# Patient Record
Sex: Female | Born: 1955 | Race: White | Hispanic: No | Marital: Single | Smoking: Former smoker
Health system: Southern US, Community
[De-identification: ages and names within clinical notes are randomized; demographics above are authoritative.]

## PROBLEM LIST (undated history)

## (undated) DIAGNOSIS — E669 Obesity, unspecified: Secondary | ICD-10-CM

## (undated) DIAGNOSIS — Z8709 Personal history of other diseases of the respiratory system: Secondary | ICD-10-CM

## (undated) DIAGNOSIS — I252 Old myocardial infarction: Secondary | ICD-10-CM

## (undated) DIAGNOSIS — M1990 Unspecified osteoarthritis, unspecified site: Secondary | ICD-10-CM

## (undated) DIAGNOSIS — C679 Malignant neoplasm of bladder, unspecified: Secondary | ICD-10-CM

## (undated) DIAGNOSIS — I5030 Unspecified diastolic (congestive) heart failure: Secondary | ICD-10-CM

## (undated) DIAGNOSIS — Z955 Presence of coronary angioplasty implant and graft: Secondary | ICD-10-CM

## (undated) DIAGNOSIS — C44601 Unspecified malignant neoplasm of skin of unspecified upper limb, including shoulder: Secondary | ICD-10-CM

## (undated) DIAGNOSIS — Z87891 Personal history of nicotine dependence: Secondary | ICD-10-CM

## (undated) DIAGNOSIS — E785 Hyperlipidemia, unspecified: Secondary | ICD-10-CM

## (undated) DIAGNOSIS — L309 Dermatitis, unspecified: Secondary | ICD-10-CM

## (undated) DIAGNOSIS — I2510 Atherosclerotic heart disease of native coronary artery without angina pectoris: Secondary | ICD-10-CM

## (undated) DIAGNOSIS — H269 Unspecified cataract: Secondary | ICD-10-CM

## (undated) DIAGNOSIS — R7303 Prediabetes: Secondary | ICD-10-CM

## (undated) DIAGNOSIS — Z973 Presence of spectacles and contact lenses: Secondary | ICD-10-CM

## (undated) DIAGNOSIS — R739 Hyperglycemia, unspecified: Secondary | ICD-10-CM

## (undated) DIAGNOSIS — I10 Essential (primary) hypertension: Secondary | ICD-10-CM

## (undated) DIAGNOSIS — D229 Melanocytic nevi, unspecified: Secondary | ICD-10-CM

## (undated) DIAGNOSIS — J869 Pyothorax without fistula: Secondary | ICD-10-CM

## (undated) DIAGNOSIS — J189 Pneumonia, unspecified organism: Secondary | ICD-10-CM

## (undated) DIAGNOSIS — N189 Chronic kidney disease, unspecified: Secondary | ICD-10-CM

## (undated) HISTORY — PX: OTHER SURGICAL HISTORY: SHX169

## (undated) HISTORY — DX: Unspecified diastolic (congestive) heart failure: I50.30

## (undated) HISTORY — DX: Essential (primary) hypertension: I10

## (undated) HISTORY — PX: TUBAL LIGATION: SHX77

## (undated) HISTORY — DX: Old myocardial infarction: I25.2

## (undated) HISTORY — DX: Melanocytic nevi, unspecified: D22.9

## (undated) HISTORY — DX: Pyothorax without fistula: J86.9

## (undated) HISTORY — PX: VIDEO ASSISTED THORACOSCOPY (VATS)/EMPYEMA: SHX6172

## (undated) HISTORY — PX: DILATION AND CURETTAGE OF UTERUS: SHX78

## (undated) HISTORY — PX: CARDIAC CATHETERIZATION: SHX172

---

## 2001-09-17 ENCOUNTER — Other Ambulatory Visit: Admission: RE | Admit: 2001-09-17 | Discharge: 2001-09-17 | Payer: Self-pay | Admitting: *Deleted

## 2002-10-08 ENCOUNTER — Other Ambulatory Visit: Admission: RE | Admit: 2002-10-08 | Discharge: 2002-10-08 | Payer: Self-pay | Admitting: *Deleted

## 2009-08-28 ENCOUNTER — Inpatient Hospital Stay (HOSPITAL_COMMUNITY): Admission: EM | Admit: 2009-08-28 | Discharge: 2009-09-05 | Payer: Self-pay | Admitting: Emergency Medicine

## 2009-08-28 ENCOUNTER — Ambulatory Visit: Payer: Self-pay | Admitting: Internal Medicine

## 2009-08-28 ENCOUNTER — Emergency Department (HOSPITAL_COMMUNITY): Admission: EM | Admit: 2009-08-28 | Discharge: 2009-08-28 | Payer: Self-pay | Admitting: Family Medicine

## 2009-08-28 IMAGING — CR DG CHEST 2V
2 series · 2 of 2 positions shown · non-contrast
Comparison: None.

CLINICAL DATA: Decreased oxygen saturation, shortness of breath for
2 days, smoking history

CHEST - 2 VIEW

[view not recorded (1 of 2)]
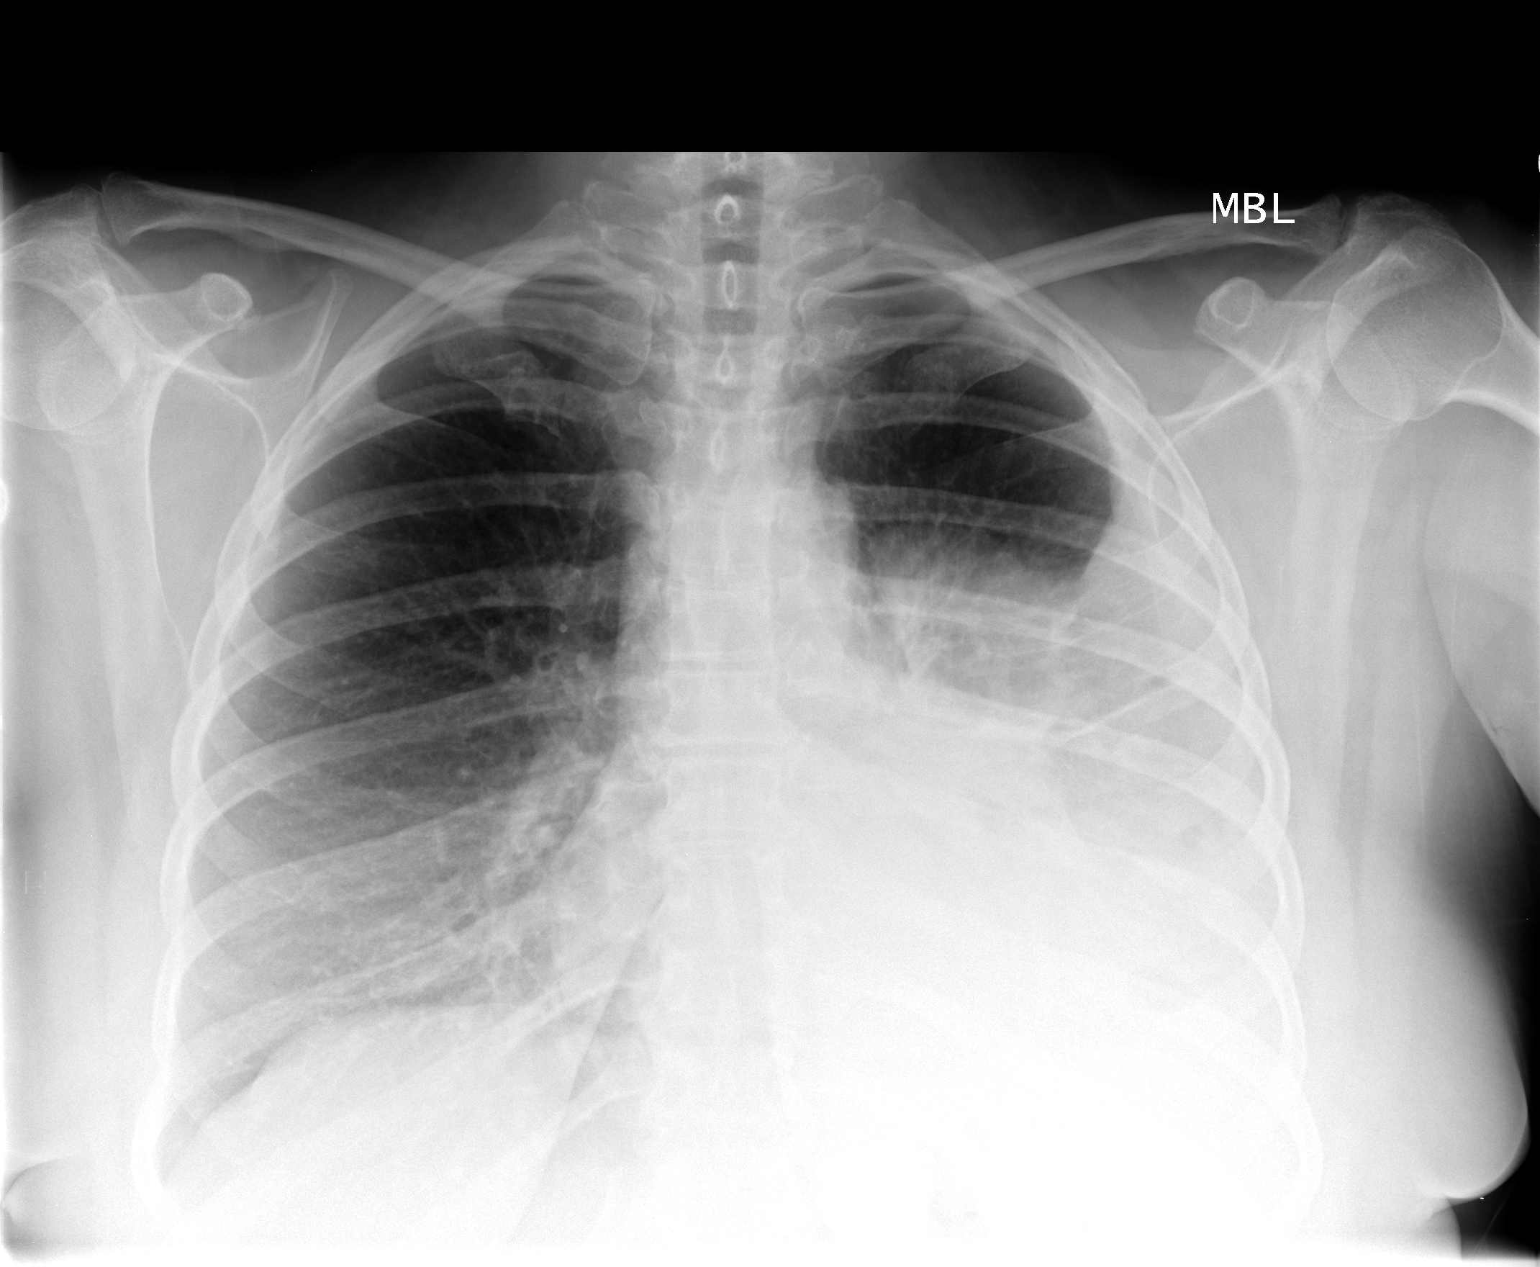

[view not recorded (2 of 2)]
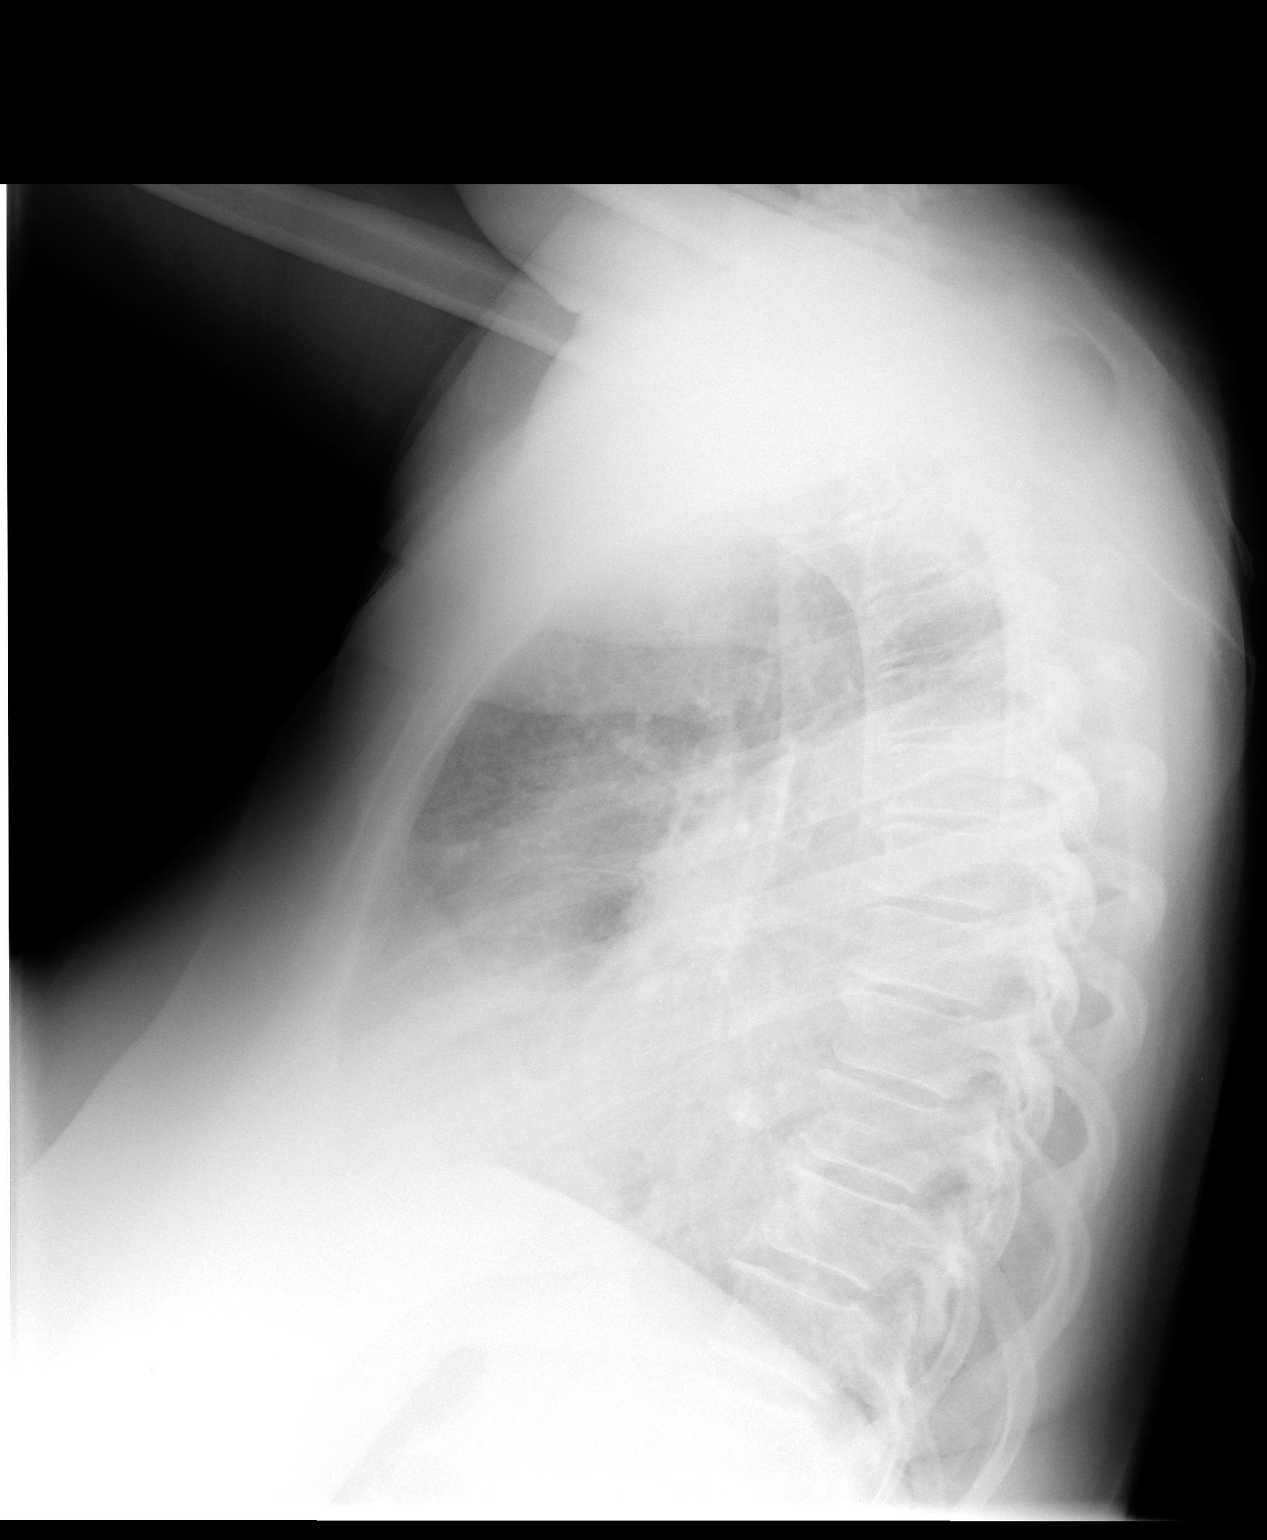

[2 of 2 positions shown; findings below may reference images not displayed]

FINDINGS: There is opacity at the left lung base consistent with
atelectasis, effusion, and possibly pneumonia.  Some bulging of the
fissure is noted on the lateral view and pneumonia is a definite
consideration versus possible loculated effusion.  A central
endobronchial lesion cannot be excluded and follow-up is
recommended.  The right lung is clear.  Heart size is stable.  No
bony abnormality is seen.
IMPRESSION: Abnormal opacity in the left lower lobe consistent with pneumonia
and left effusion, possibly loculated.  A central endobronchial
lesion cannot be excluded and follow-up is recommended.

## 2009-08-28 IMAGING — CT CT ANGIO CHEST
2 of 6 series · 19 of 36 positions shown · IV contrast (APPLIED)
Comparison: Chest radiograph same date

CLINICAL DATA: Shortness of breath, chest pain, arm hand and face
swelling

CT ANGIOGRAPHY CHEST WITH CONTRAST
TECHNIQUE: Multidetector CT imaging of the chest was performed
using the standard protocol during bolus administration of
intravenous contrast.  Multiplanar CT image reconstructions
including MIPs were obtained to evaluate the vascular anatomy.
Contrast:  100 ml Omniscan 300 IV contrast

[Series 8: pulm embolism 1.0 b25f thins · axial · 0.70mm/px · z∈[-263,-29]mm · 18 of 262 slices shown]
[im 14/262  lung]
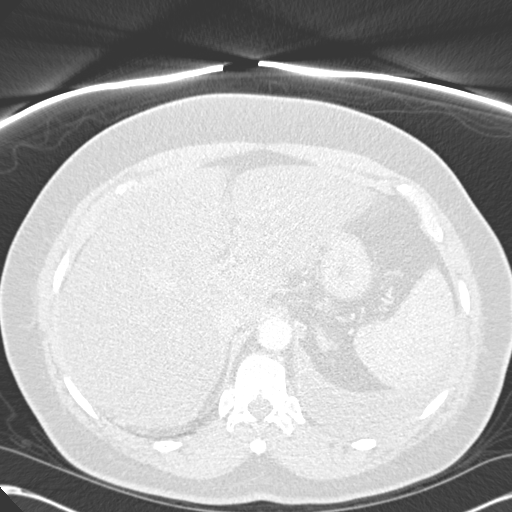
[im 27/262  mediastinal]
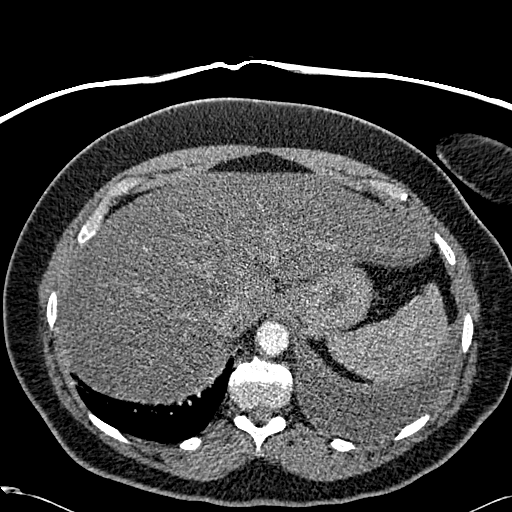
[im 40/262  lung]
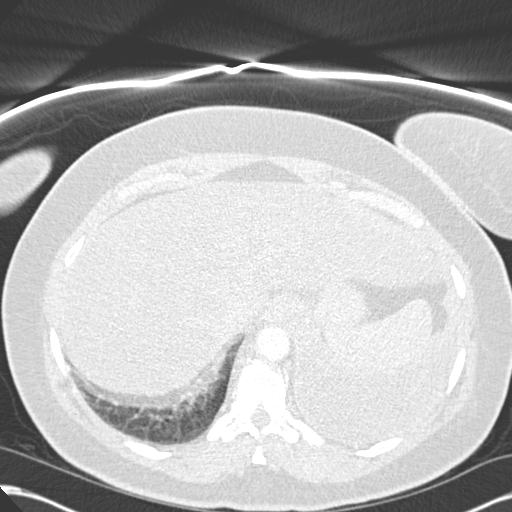
[im 53/262  mediastinal]
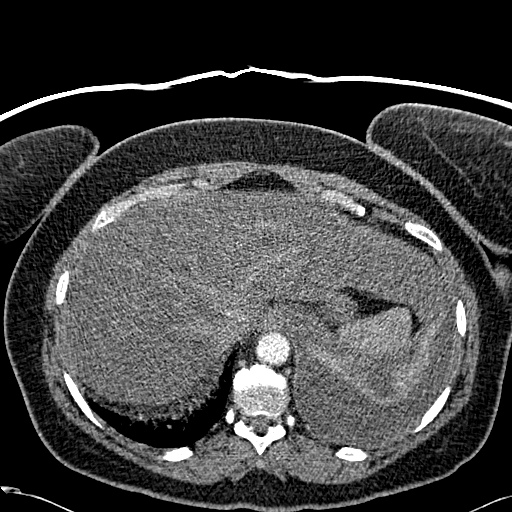
[im 66/262  lung]
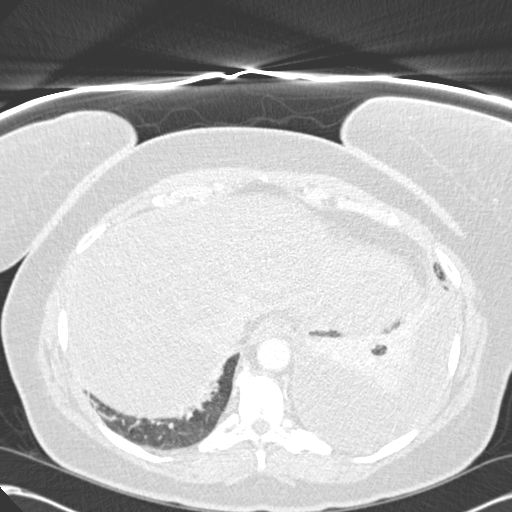
[im 79/262  mediastinal]
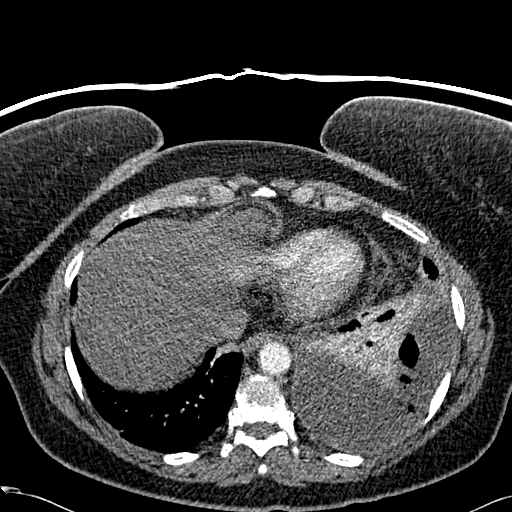
[im 92/262  lung]
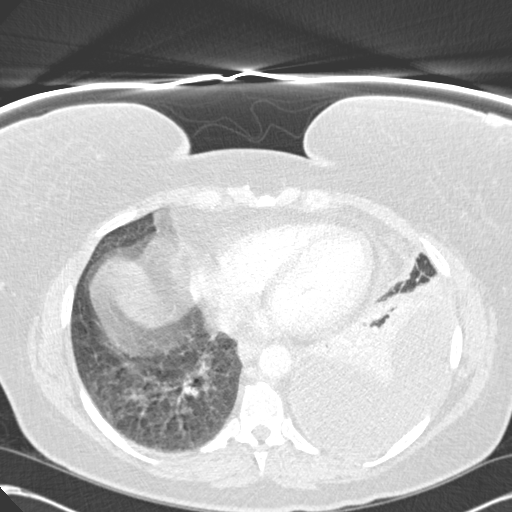
[im 105/262  mediastinal]
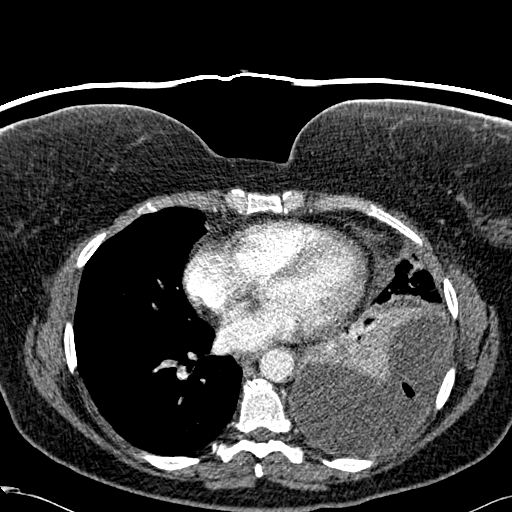
[im 118/262  lung]
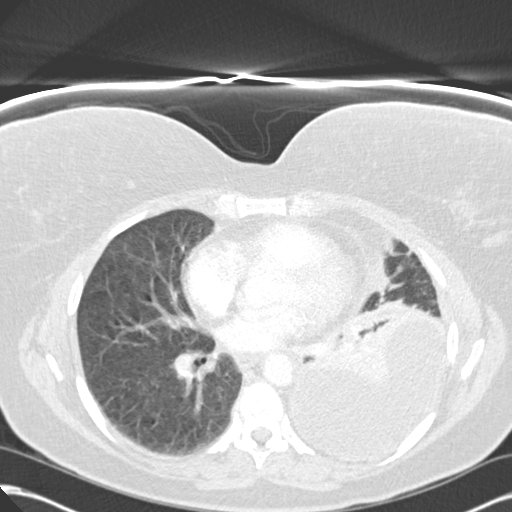
[im 144/262  mediastinal]
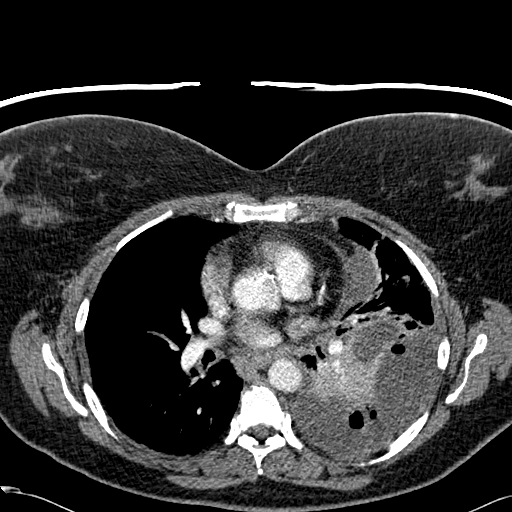
[im 157/262  lung]
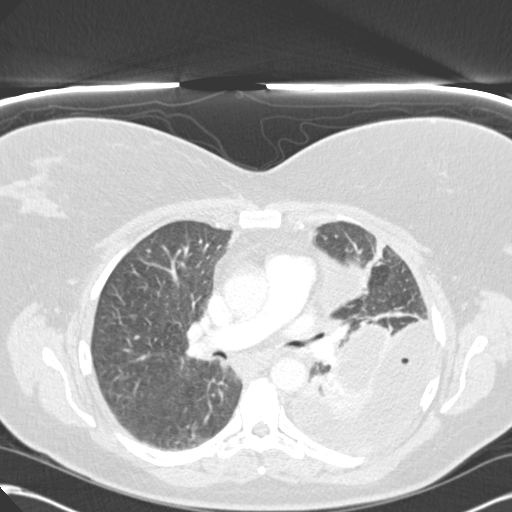
[im 170/262  mediastinal]
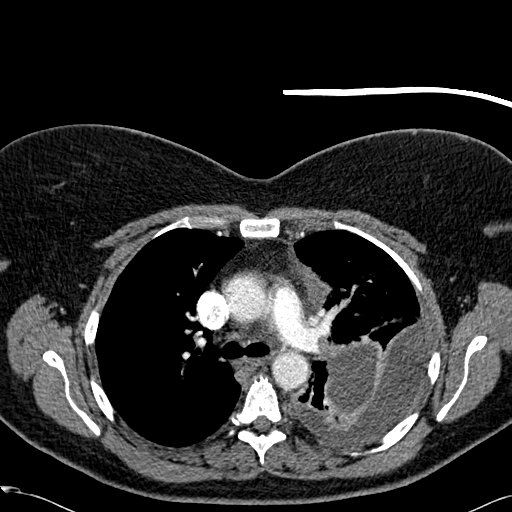
[im 183/262  lung]
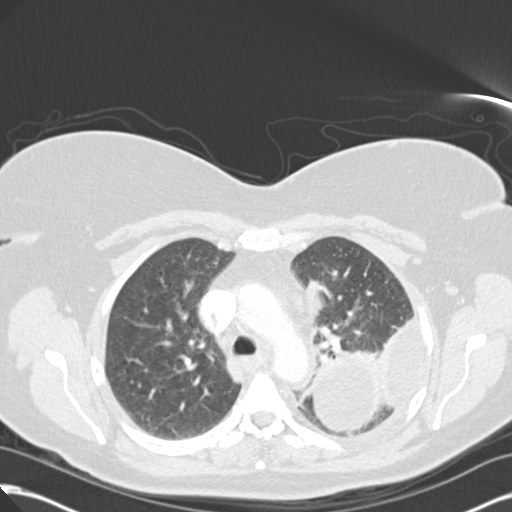
[im 196/262  mediastinal]
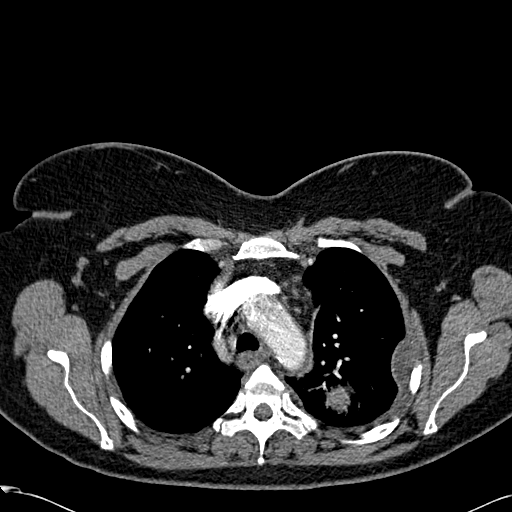
[im 209/262  lung]
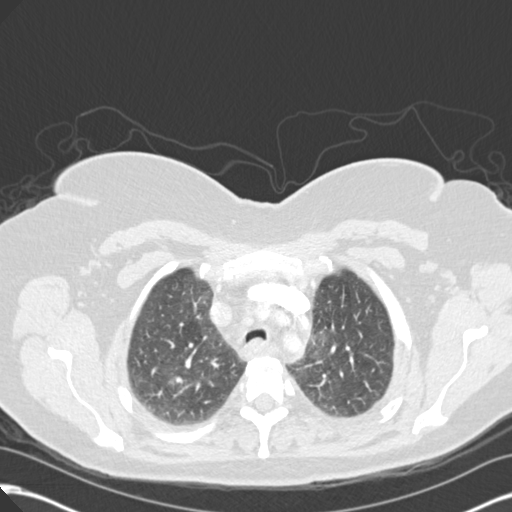
[im 222/262  mediastinal]
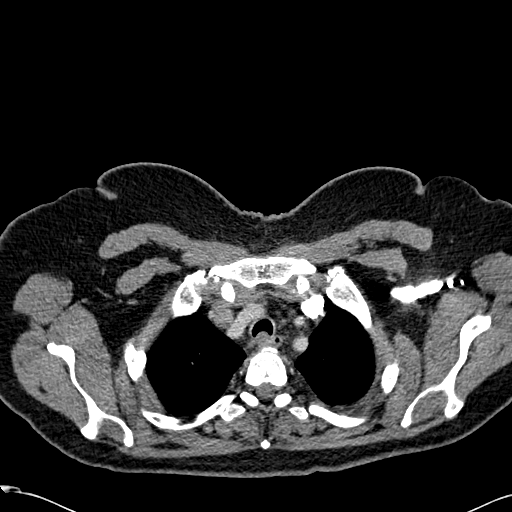
[im 235/262  lung]
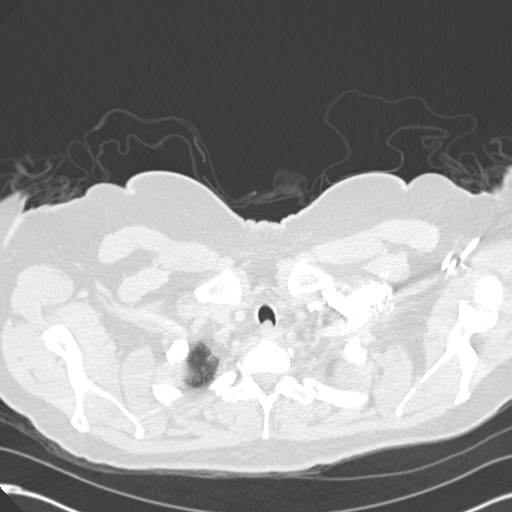
[im 248/262  mediastinal]
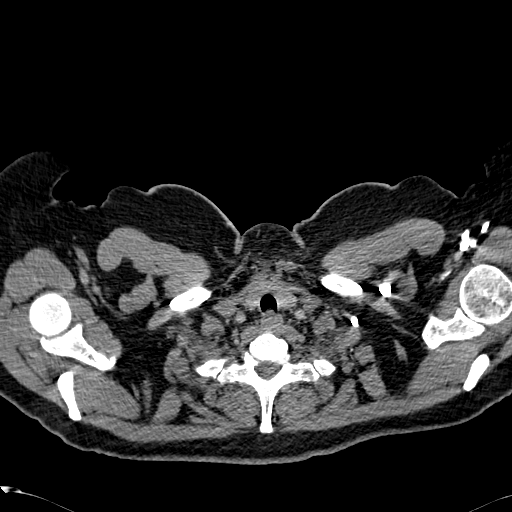

[Series 604: cor mpr · coronal · 0.70mm/px · 1 of 124 slices shown]
[im 62/124  mediastinal]
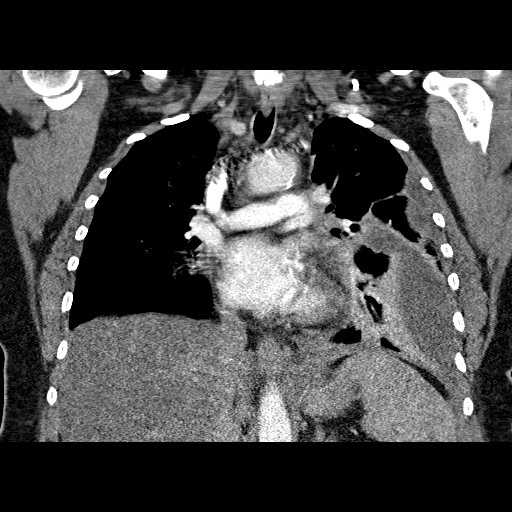

[19 of 36 positions shown; findings below may reference images not displayed]

FINDINGS: Large loculated left pleural effusion with associated
compressive atelectasis again noted.  The atelectatic lung appears
perfused.  A few foci of gas are noted within the pleural effusion,
which may suggest bronchopleural fistula.

The study is adequate for evaluation for pulmonary embolism up to
the 3rd order pulmonary arteries but is suboptimal for more distal
evaluation to air of the bolus timing.  No central focal filling
defect is seen to suggest acute pulmonary embolism.  Heart size is
normal.  No lymphadenopathy.  Trace fluid abuts the left
pericardial surface.

Examination is degraded by patient motion. Linear areas of left
upper lobe atelectasis are noted.  The right lung is grossly clear.
Central airways are patent.  Mild apparent narrowing of the left
mainstem bronchus at its mid aspect is noted, image 35.  There is
also mild motion artifact at this level, which makes this finding
difficult to interpret with certainty.

No acute osseous abnormality.

Review of the MIP images confirms the above findings.
IMPRESSION: Large loculated left pleural effusion containing foci of gas which
may suggest bronchopleural fistula.  Associated compressive
atelectasis is noted.

Possible narrowing versus artifactual appearance of the mid aspect
of the left mainstem bronchus.

No central pulmonary embolism identified.

## 2009-08-29 ENCOUNTER — Ambulatory Visit: Payer: Self-pay | Admitting: Thoracic Surgery (Cardiothoracic Vascular Surgery)

## 2009-08-29 IMAGING — CR DG CHEST 1V PORT
1 series · 1 of 1 positions shown · non-contrast
Comparison: CTA of the chest performed earlier today at [DATE] p.m.

CLINICAL DATA: Left-sided pneumonia and loculated effusion, status
post thoracentesis; shortness of breath.

PORTABLE CHEST - 1 VIEW

[AP]
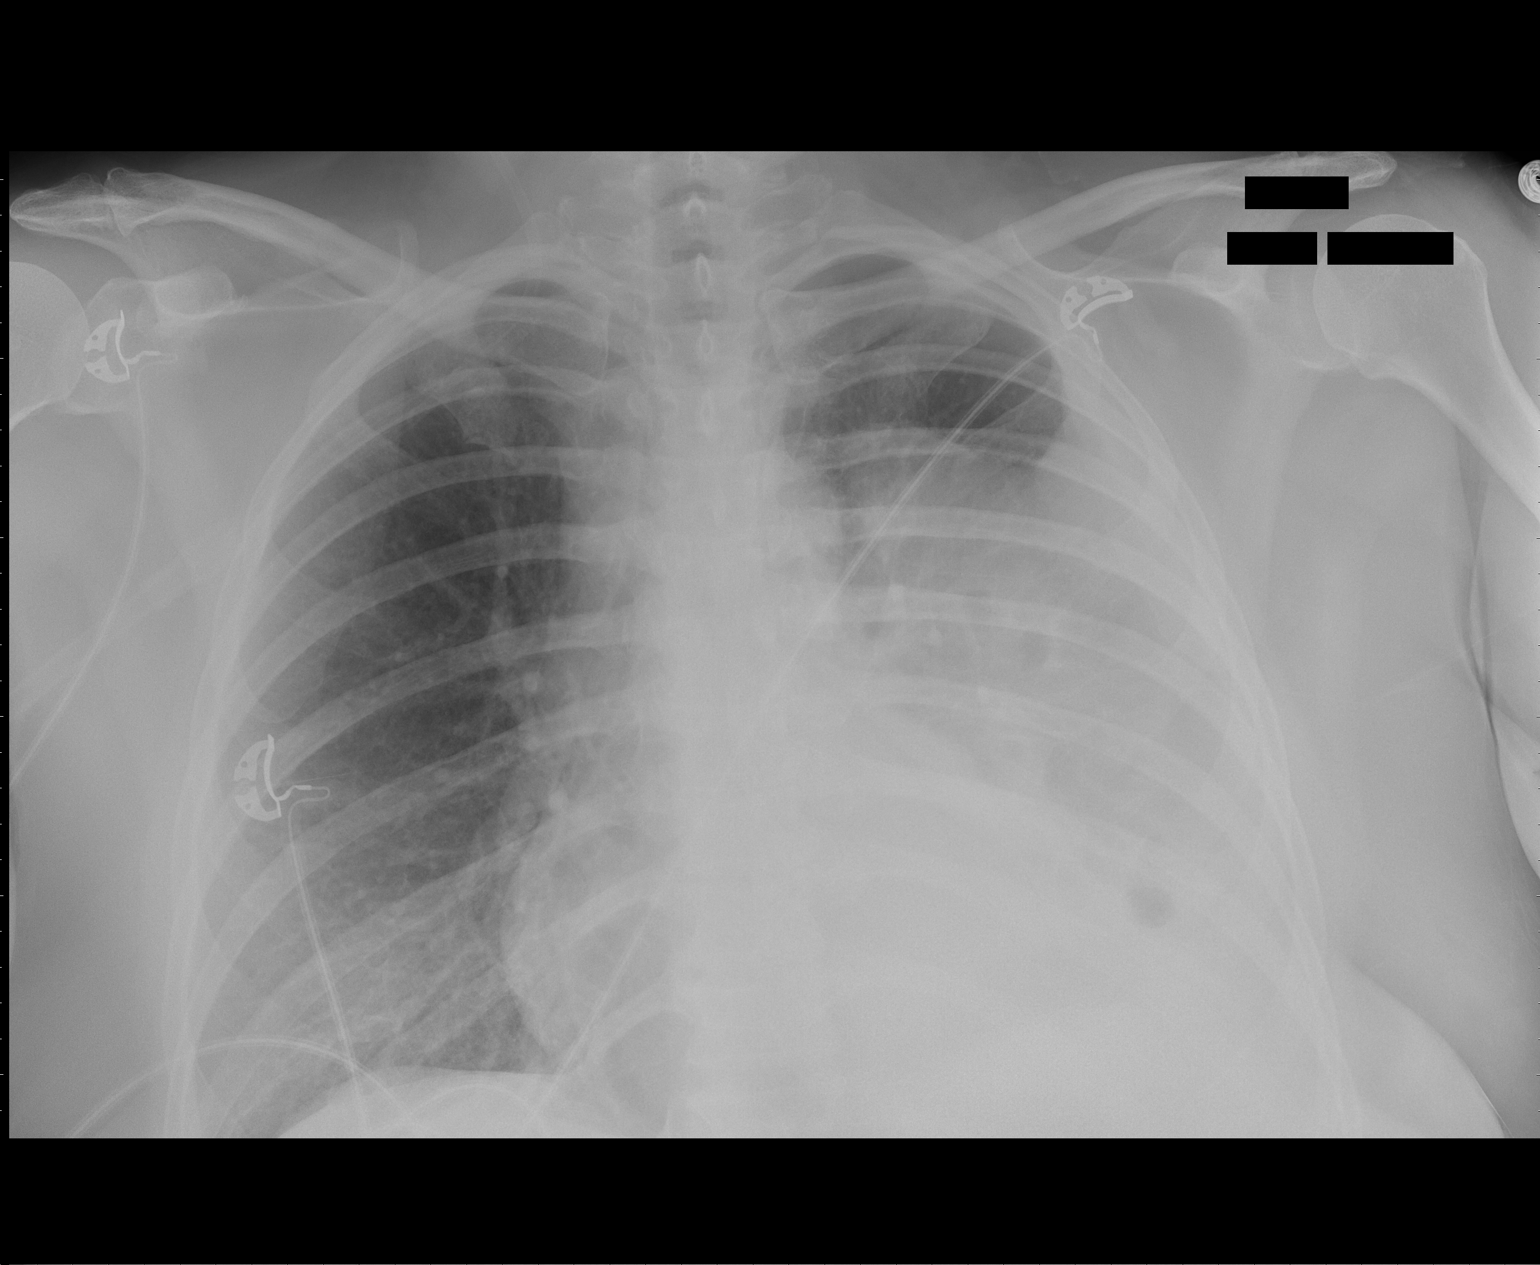

[1 of 1 positions shown; findings below may reference images not displayed]

FINDINGS: As noted on the recent CTA, there is a loculated large
left-sided pleural effusion, with associated foci of air.  The air
may reflect recent thoracentesis; suggest correlation with the
timing of the thoracentesis.  Associated airspace consolidation is
again noted.  The right remains clear.  No pneumothorax is seen.

The cardiomediastinal silhouette is borderline normal in size,
given technique.  No acute osseous abnormalities are seen.
IMPRESSION: Large loculated left-sided pleural effusion; associated foci of air
may reflect recent thoracentesis.  Suggest correlation with the
timing of the thoracentesis; the CTA of the chest was performed at
[DATE] p.m.  Associated left-sided airspace consolidation again
noted.

## 2009-08-30 ENCOUNTER — Encounter: Payer: Self-pay | Admitting: Thoracic Surgery (Cardiothoracic Vascular Surgery)

## 2009-08-30 IMAGING — CR DG CHEST 1V PORT
1 series · 1 of 1 positions shown · non-contrast
Comparison: [DATE] radiographs and [DATE] CT.

CLINICAL DATA: Postop VATS and central line placement.

PORTABLE CHEST - 1 VIEW

[view not recorded]
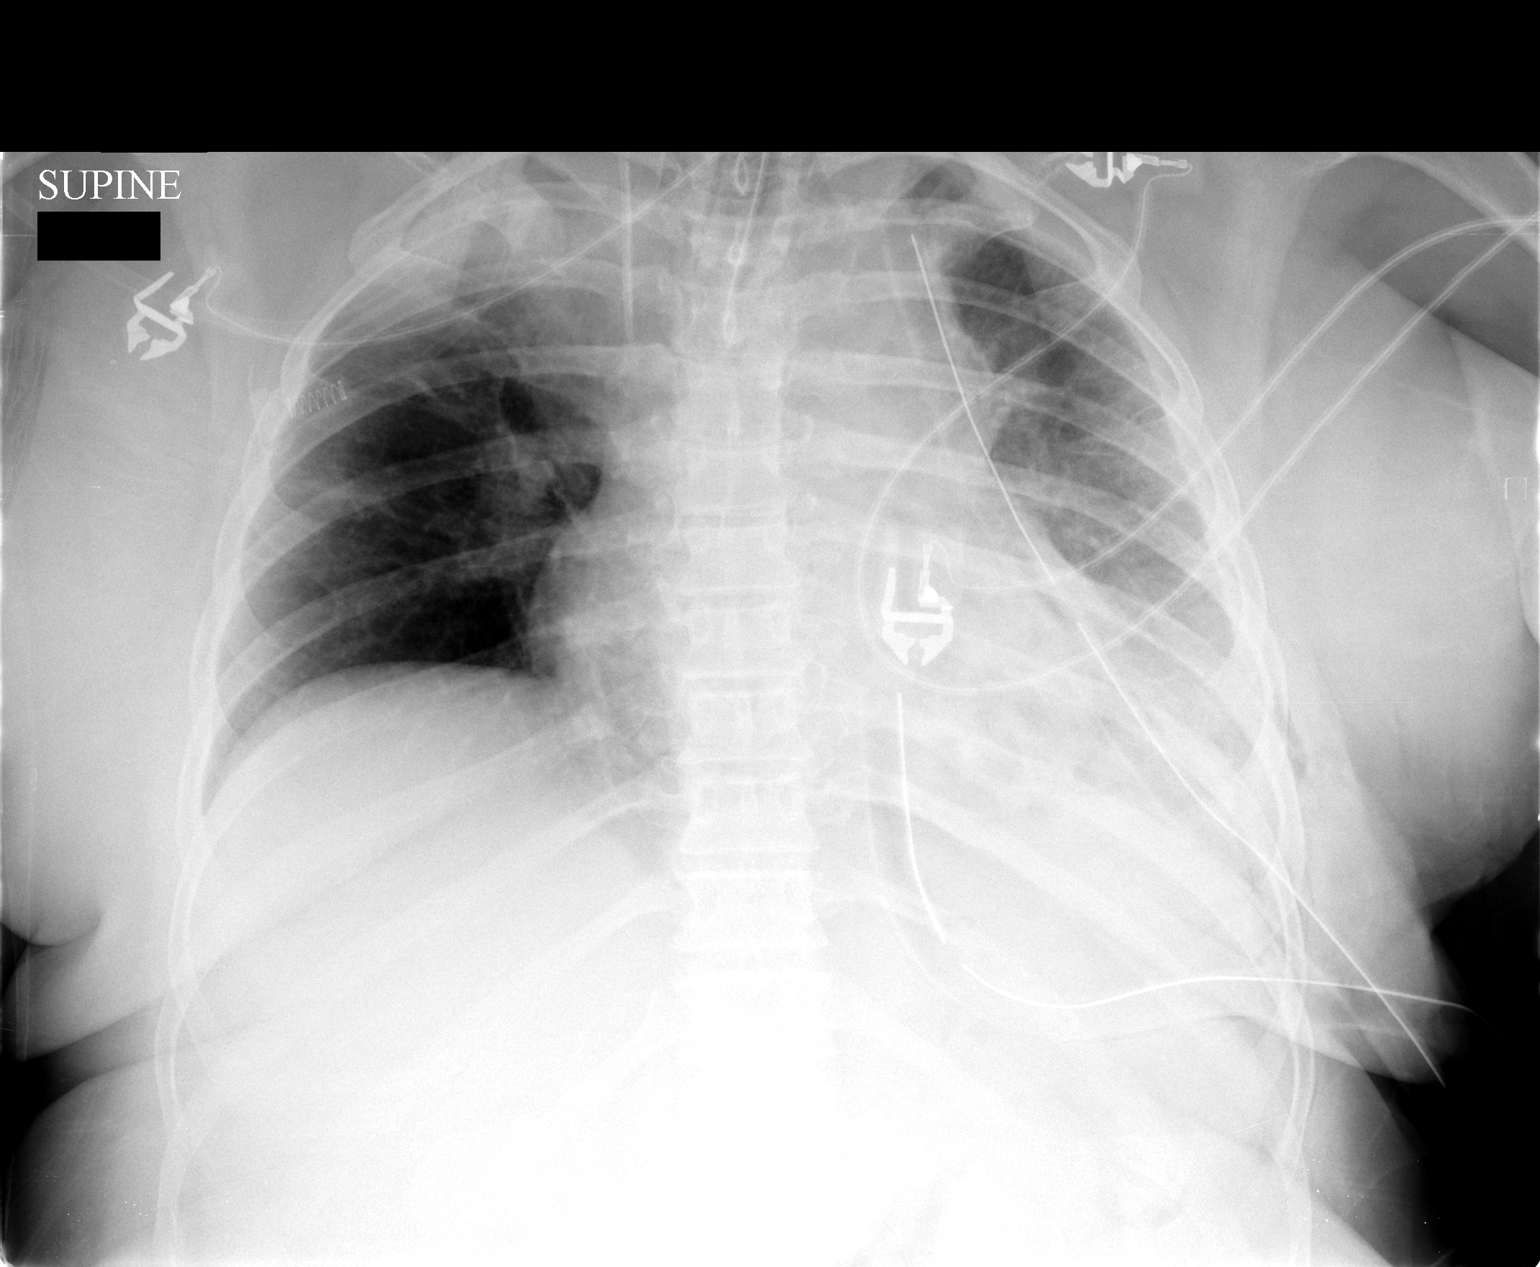

[1 of 1 positions shown; findings below may reference images not displayed]

FINDINGS: [FV] hours.  Endotracheal tube appears satisfactorily
positioned.  The carina is not well defined.  There are two left-
sided chest tubes.  Loculated left pleural effusion has been nearly
completely evacuated.  There is a small amount of soft tissue
emphysema within the left chest wall.  No pneumothorax is
identified.

Right IJ central venous catheter tip is in the lower SVC.  There is
new opacity in the right upper lobe, probably reflecting
atelectasis.  No significant right-sided pleural effusion is
demonstrated.  The visualized heart size and mediastinal contours
are stable.
IMPRESSION: 1.  Near complete evacuation of loculated left pleural effusion
status post VATS and chest tube placement.
2.  New right upper lobe volume loss and opacity most consistent
with atelectasis.  Follow-up is recommended.
3.  No pneumothorax.

## 2009-08-31 IMAGING — CR DG CHEST 1V PORT
1 series · 1 of 1 positions shown · non-contrast
Comparison: [DATE] and CT chest [DATE]

CLINICAL DATA: Chest tubes, VATS, shortness of breath.

PORTABLE CHEST - 1 VIEW

[view not recorded]
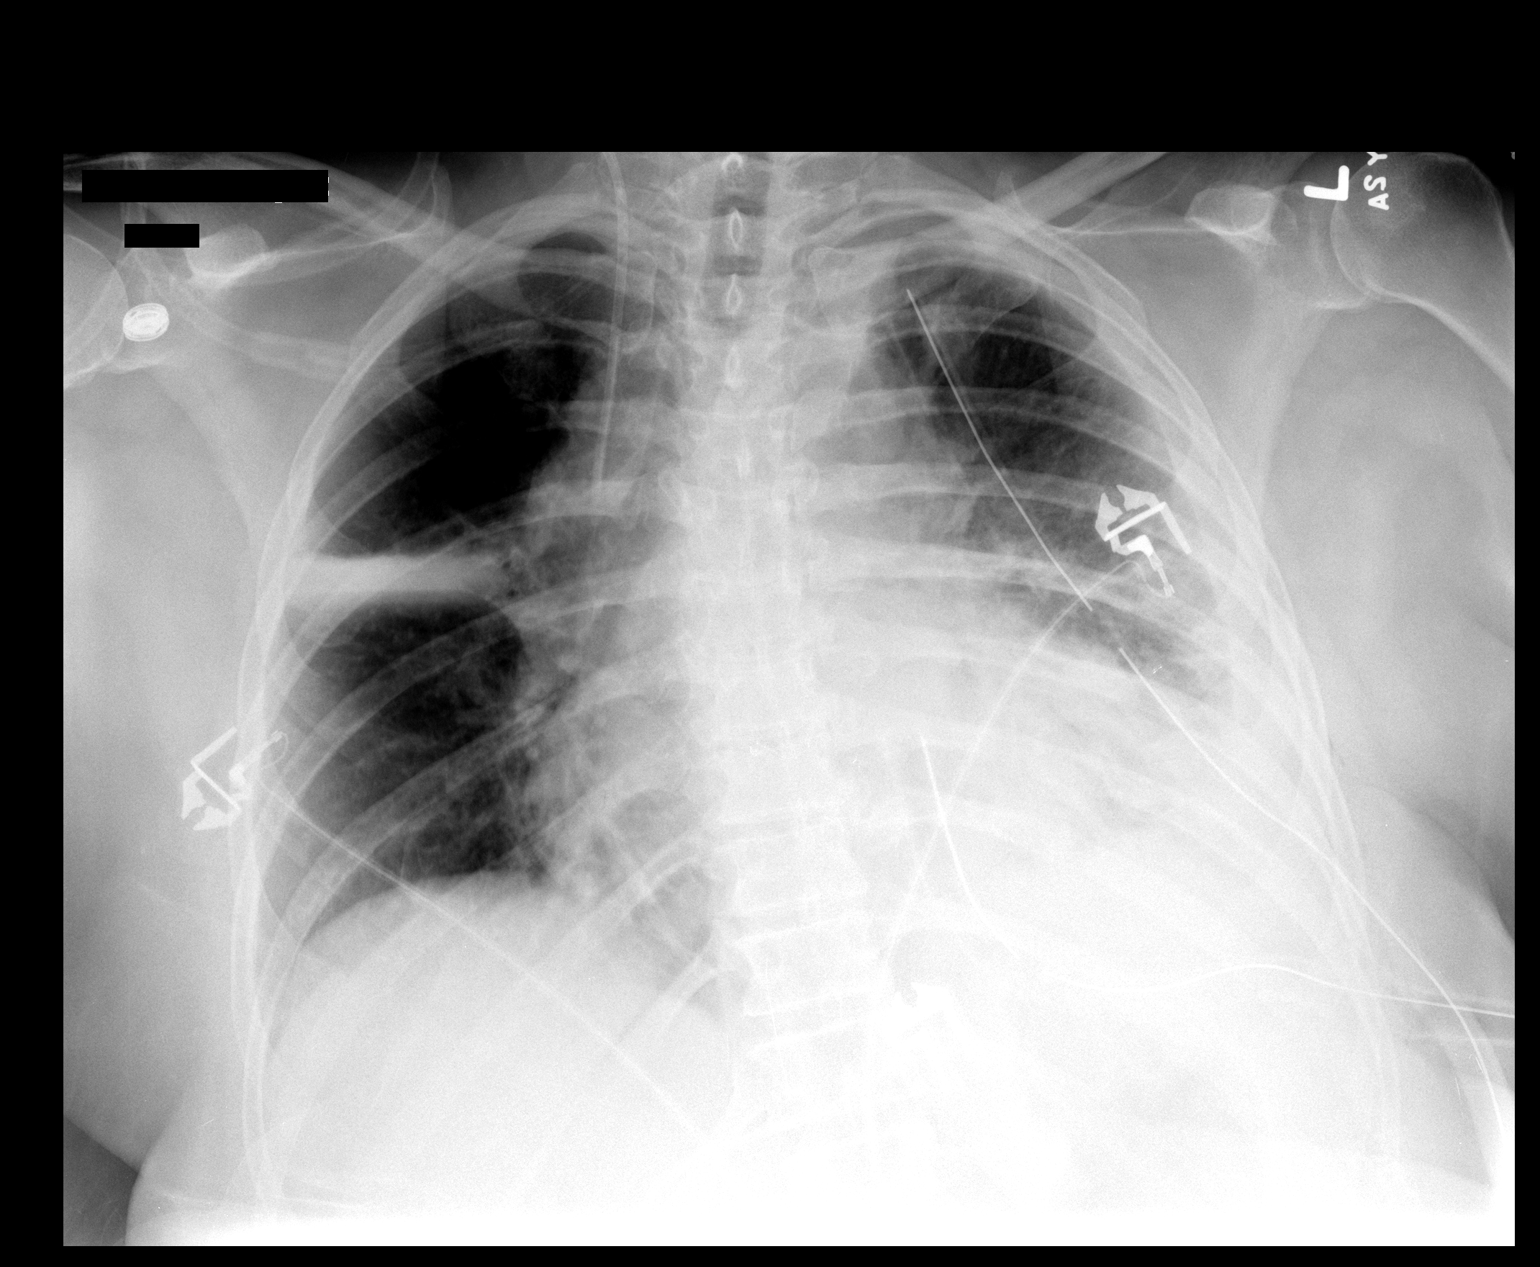

[1 of 1 positions shown; findings below may reference images not displayed]

FINDINGS: The patient has been extubated.  Right IJ central line
tip projects near the junction of the brachiocephalic veins.  Two
left chest tubes remain in place.

Heart size is grossly stable.  There is new atelectasis along the
minor fissure.  Airspace consolidation in the lingula and left
lower lobe persists.  Small left pleural effusion.  No
pneumothorax.
IMPRESSION: 1.  Lingular and left lower lobe collapse/consolidation, with a
small left pleural effusion.  Two left chest tubes are in place.
2.  Improving aeration in the right upper lobe, with residual
atelectasis along the minor fissure.

## 2009-09-01 IMAGING — CR DG CHEST 1V PORT
1 series · 1 of 1 positions shown · non-contrast
Comparison: [DATE] and earlier.

CLINICAL DATA: 54-year-old female with pneumothorax.

PORTABLE CHEST - 1 VIEW

[view not recorded]
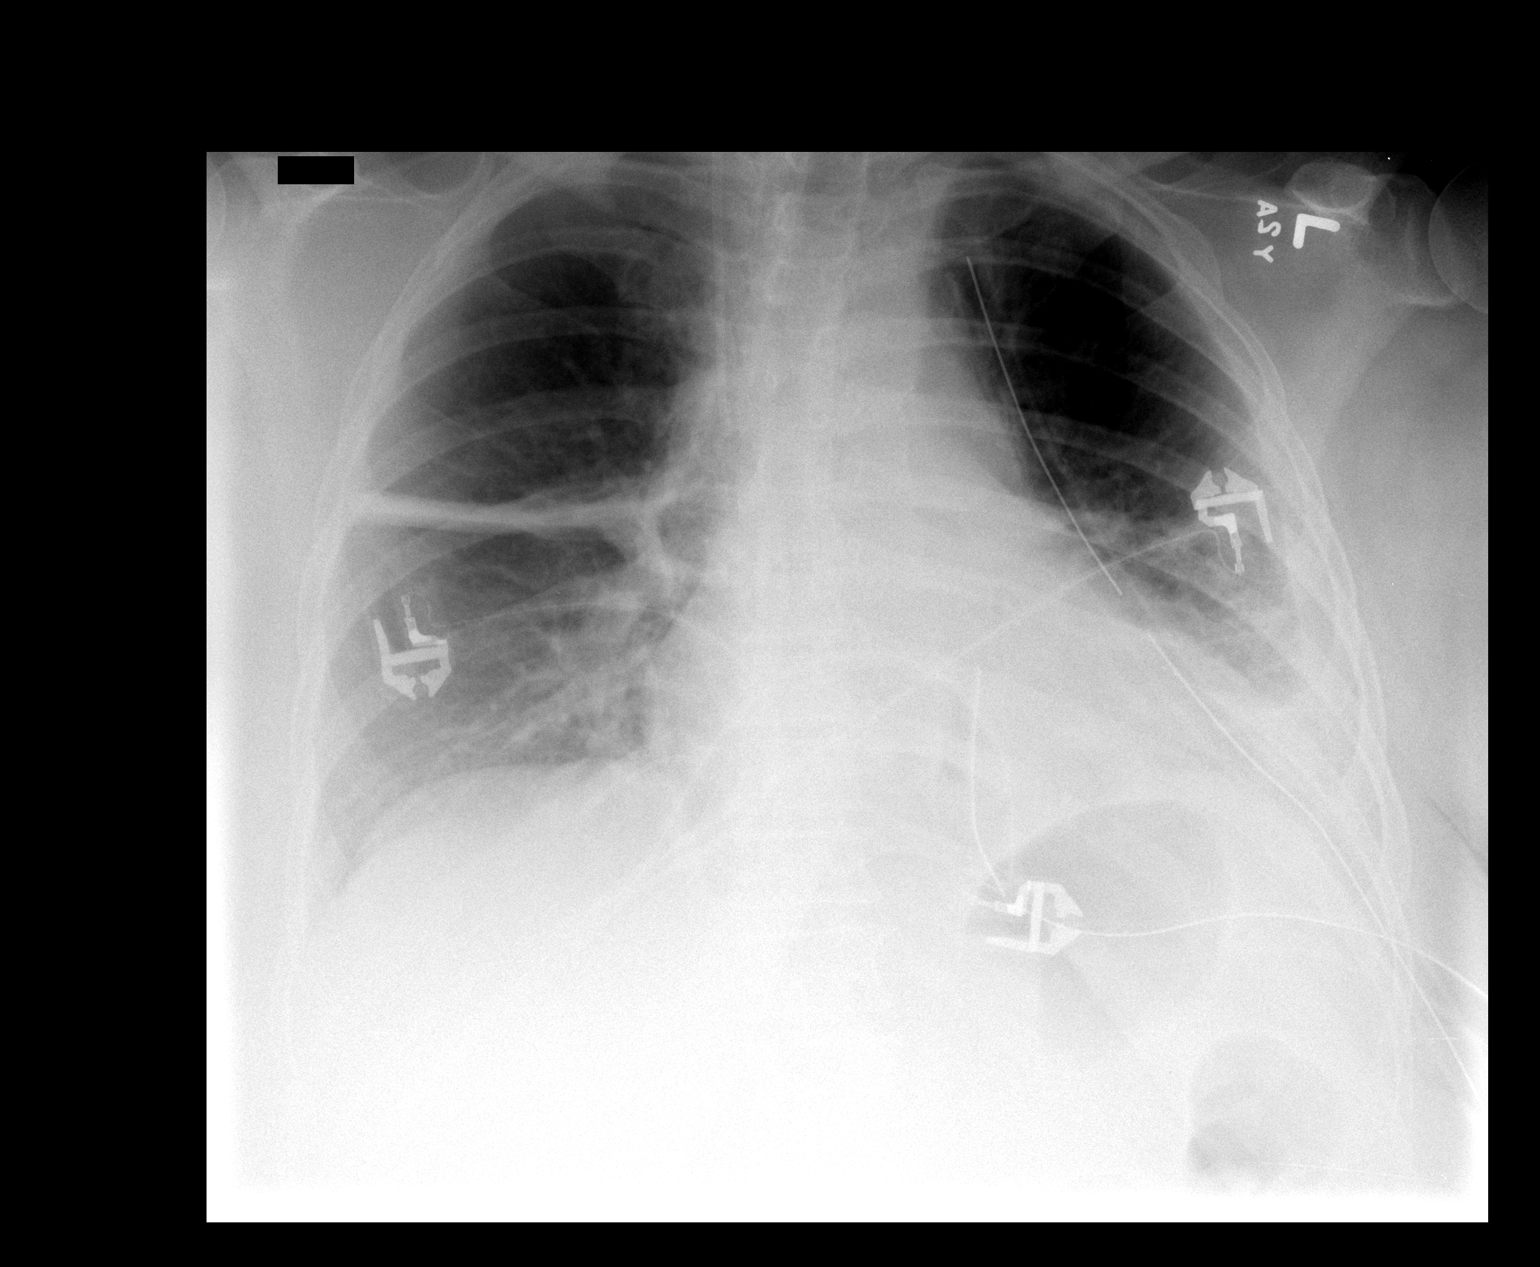

[1 of 1 positions shown; findings below may reference images not displayed]

FINDINGS: Portable semi upright AP view [0T] hours.  Stable 2 left
chest tubes.  Stable right IJ central line.  Stable cardiac size
and mediastinal contours.  Visualized tracheal air column is within
normal limits.  Continued plate-like atelectasis at the level of
the right minor fissure.  Mildly improved ventilation at the left
base.  Small volume residual loculated left pleural effusion
suspected.  No pneumothorax.
IMPRESSION: 1.  Stable left chest tube and right central line.  No
pneumothorax.
2.  Small volume of residual left effusions suspected.  Mildly
decreased atelectasis.

## 2009-09-02 IMAGING — CR DG CHEST 1V PORT
1 series · 1 of 1 positions shown · non-contrast
Comparison: Portable chest x-ray of [DATE]

CLINICAL DATA: Pneumothorax, post VATS

PORTABLE CHEST - 1 VIEW

[view not recorded]
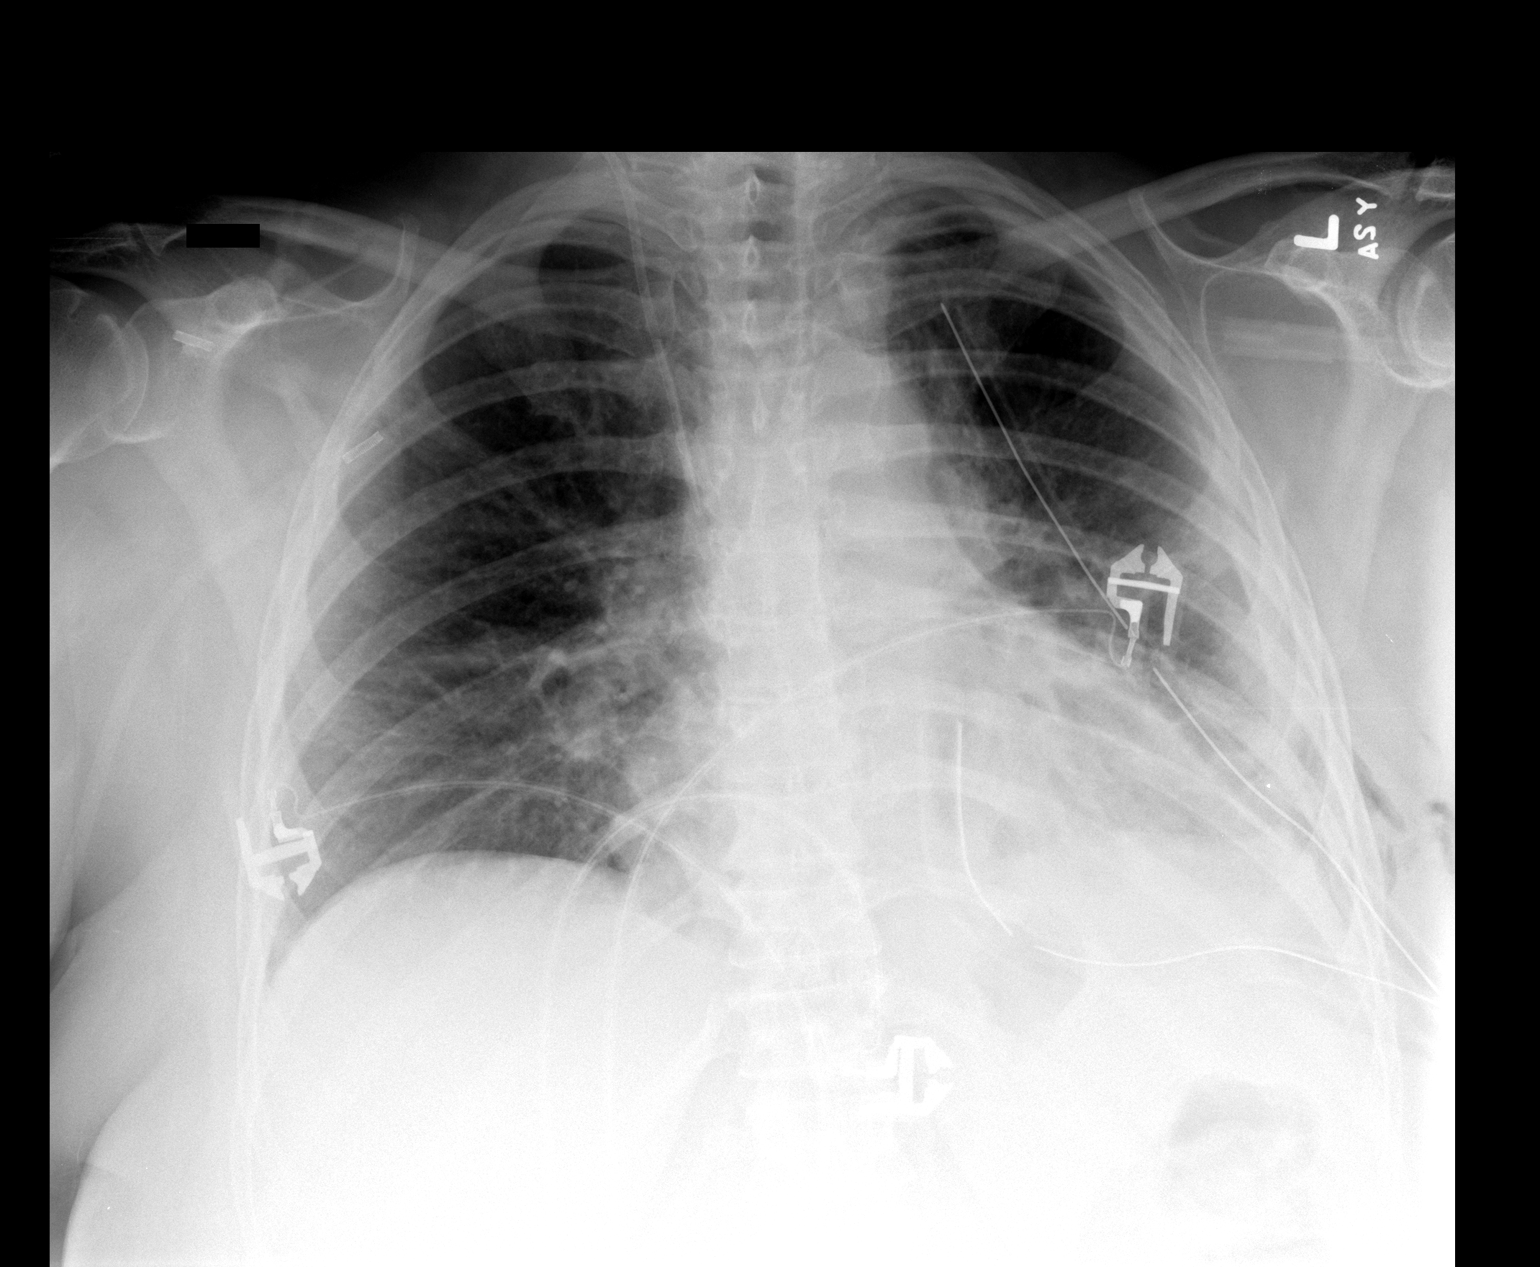

[1 of 1 positions shown; findings below may reference images not displayed]

FINDINGS: The lungs appear slightly better aerated.  Basilar
opacities remain left greater than right.  Two left chest tubes are
present and no left pneumothorax is seen.  A small amount of left
chest wall subcutaneous air remains.  Right central venous catheter
is unchanged.  Mild cardiomegaly remains.
IMPRESSION: Little change in basilar opacities left greater than right most
consistent with atelectasis.  Two left chest tubes remain and no
pneumothorax is seen.

## 2009-09-03 IMAGING — CR DG CHEST 1V PORT
1 series · 1 of 1 positions shown · non-contrast
Comparison: [DATE]

CLINICAL DATA: Pneumothorax.  Shortness of breath.

PORTABLE CHEST - 1 VIEW

[AP]
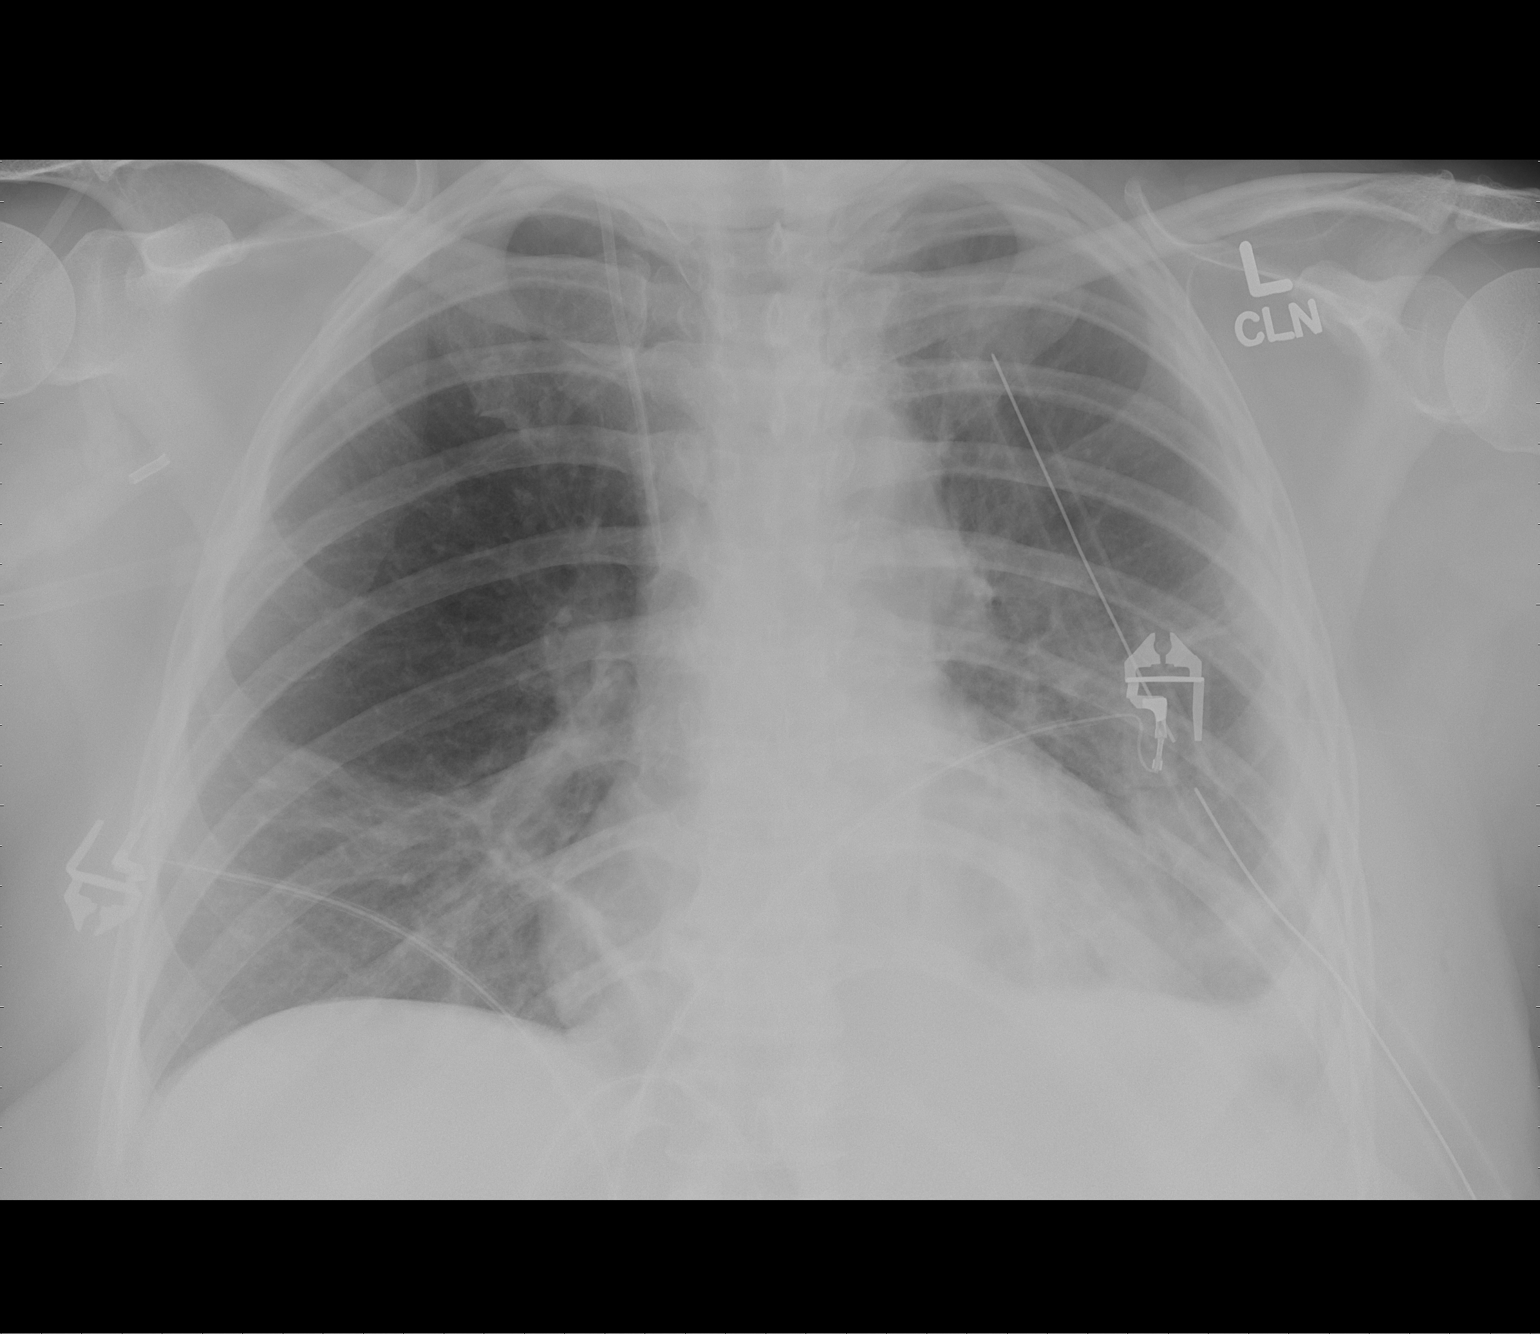

[1 of 1 positions shown; findings below may reference images not displayed]

FINDINGS: One of two left chest tubes has been removed.  Right IJ
line tip:  SVC.

The left pleural effusion is again noted.  There is atelectasis at
the left lung base along with mild right basilar atelectasis.

Mild cardiomegaly noted.  No definite pneumothorax is
radiographically apparent.
IMPRESSION: 1.  Removal of one of the left-sided chest tubes.
2.  Mildly increased right basilar atelectasis.
3.  Otherwise stable.

## 2009-09-04 IMAGING — CR DG CHEST 1V PORT
1 series · 1 of 1 positions shown · non-contrast
Comparison: [DATE]

CLINICAL DATA: Pneumonia.  Pleural effusion.  Shortness of breath.

PORTABLE CHEST - 1 VIEW

[AP]
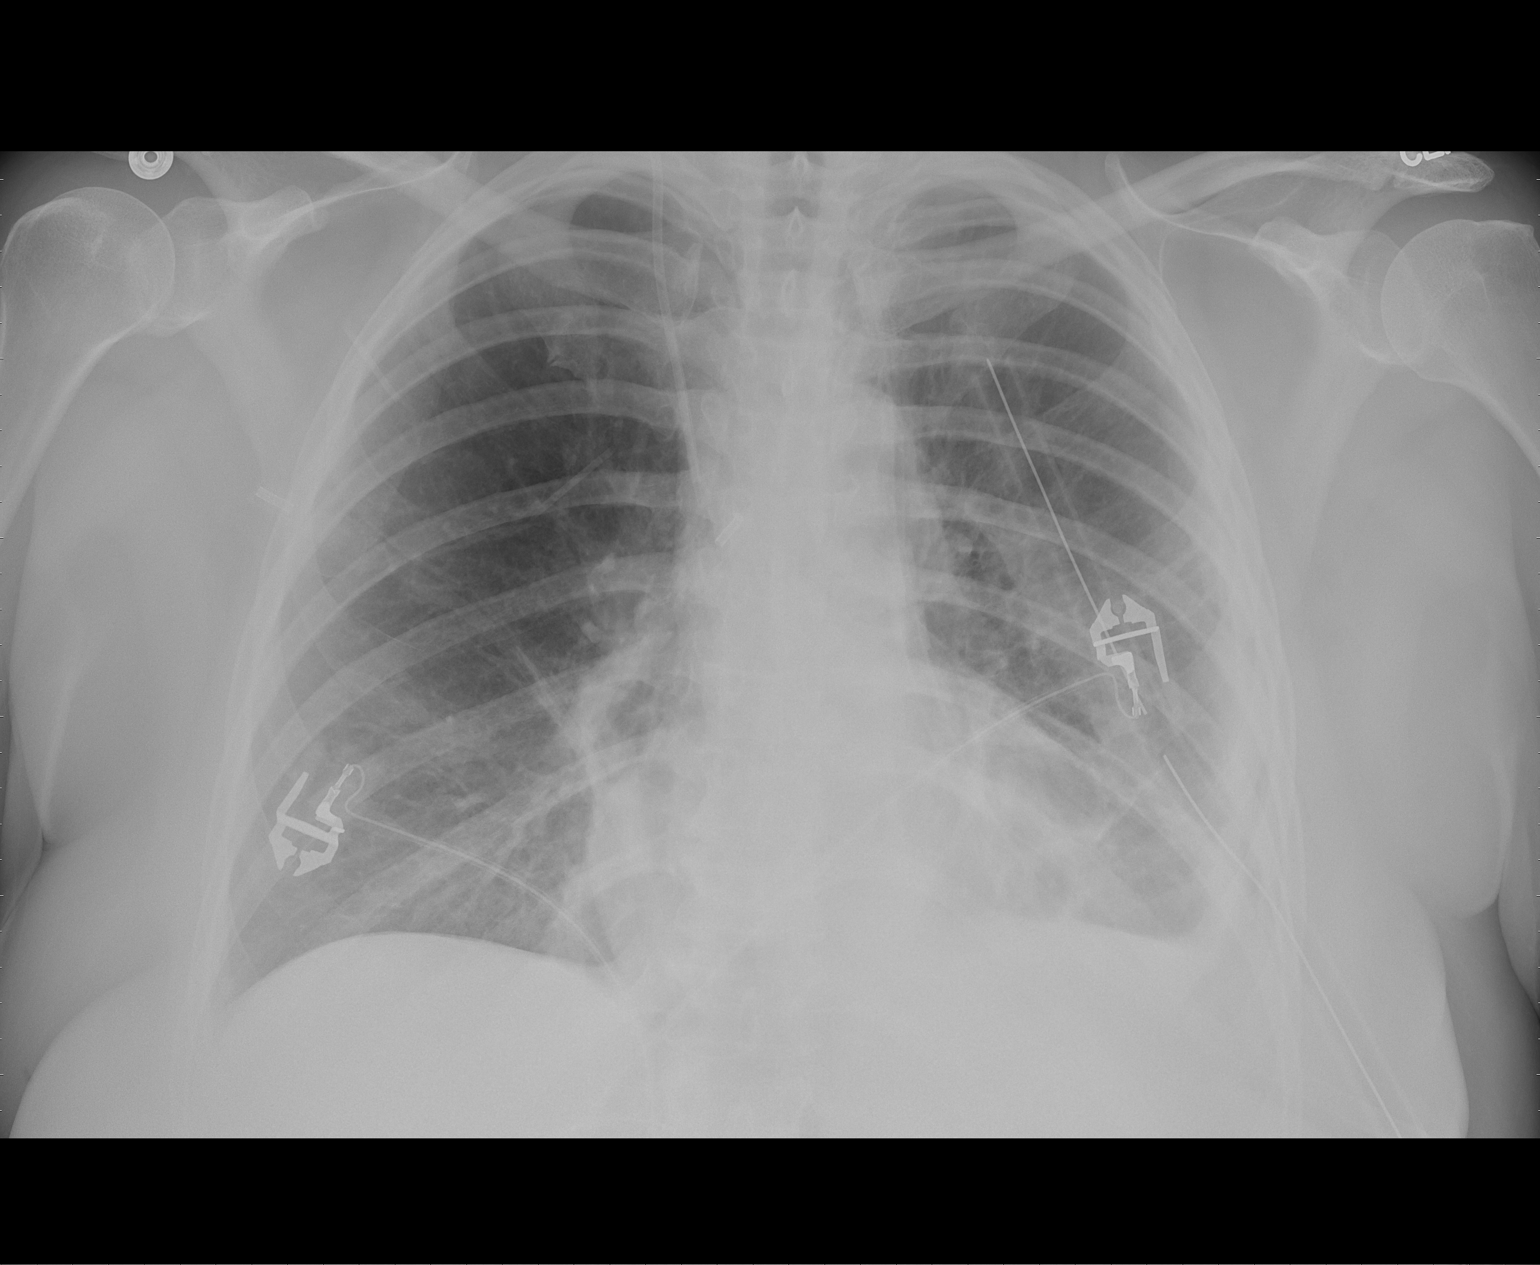

[1 of 1 positions shown; findings below may reference images not displayed]

FINDINGS: Left chest tube remains in place.  Small left pleural
effusion or pleural thickening remains stable.  Left basilar
atelectasis is unchanged. No pneumothorax identified.

Decreased atelectasis is seen in the right lower lung.  Heart size
is stable.  Right jugular center venous catheter remains in
appropriate position.
IMPRESSION: 1.  Stable small left pleural effusion versus thickening, and left
lower lobe atelectasis.  No evidence of pneumothorax.
2.  Decreased right lower lung atelectasis.

## 2009-09-04 IMAGING — CR DG CHEST 1V PORT
1 series · 1 of 1 positions shown · non-contrast
Comparison: Prior today

CLINICAL DATA: Pneumonia.  Pleural effusion.  Chest tube removal.

PORTABLE CHEST - 1 VIEW

[AP]
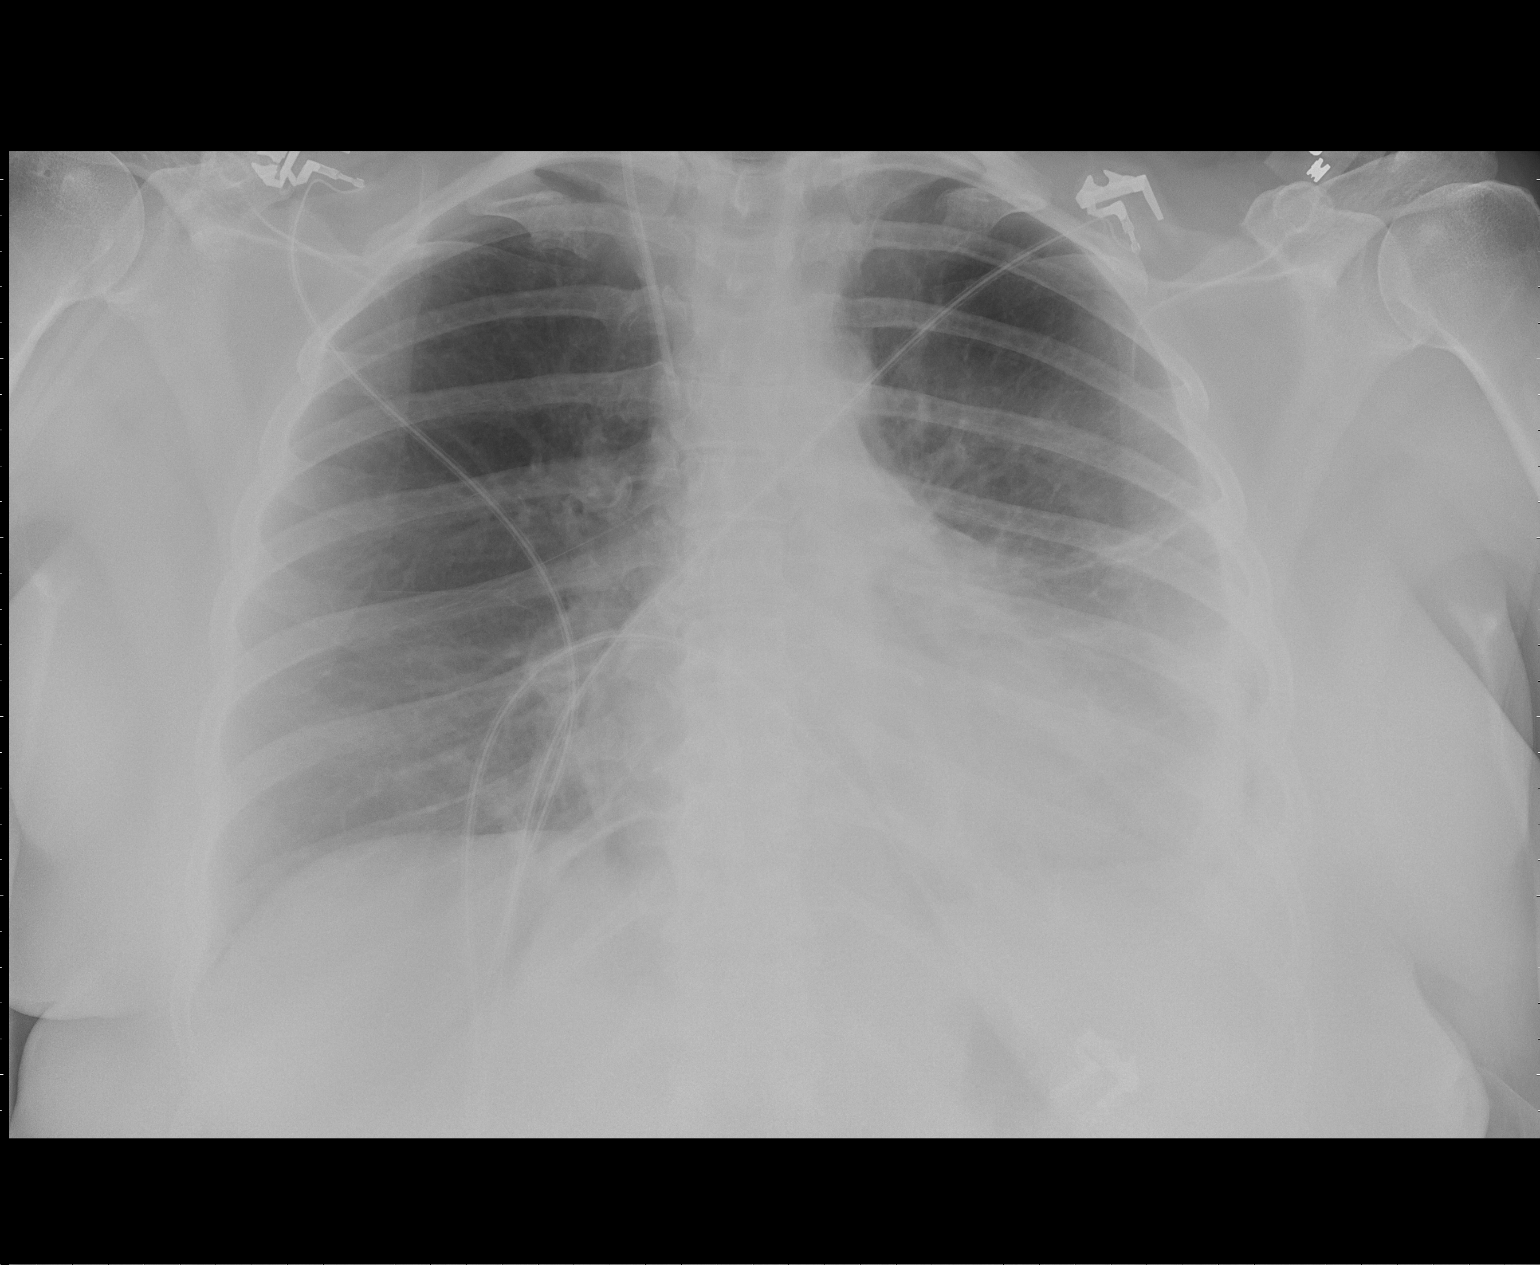

[1 of 1 positions shown; findings below may reference images not displayed]

FINDINGS: Left chest tube been removed.  A small loculated
hydropneumothorax in the lateral left lung base is unchanged in
size.  Left lower lobe atelectasis is stable.  Mild atelectasis in
the medial right lung base is also stable.
IMPRESSION: Stable size of small loculated hydropneumothorax at left lung base
following chest tube removal.  Stable bibasilar atelectasis.

## 2009-09-05 IMAGING — CR DG CHEST 2V
2 series · 2 of 2 positions shown · non-contrast
Comparison: [DATE]

CLINICAL DATA: Cough, pneumonia

CHEST - 2 VIEW

[w chest pa]
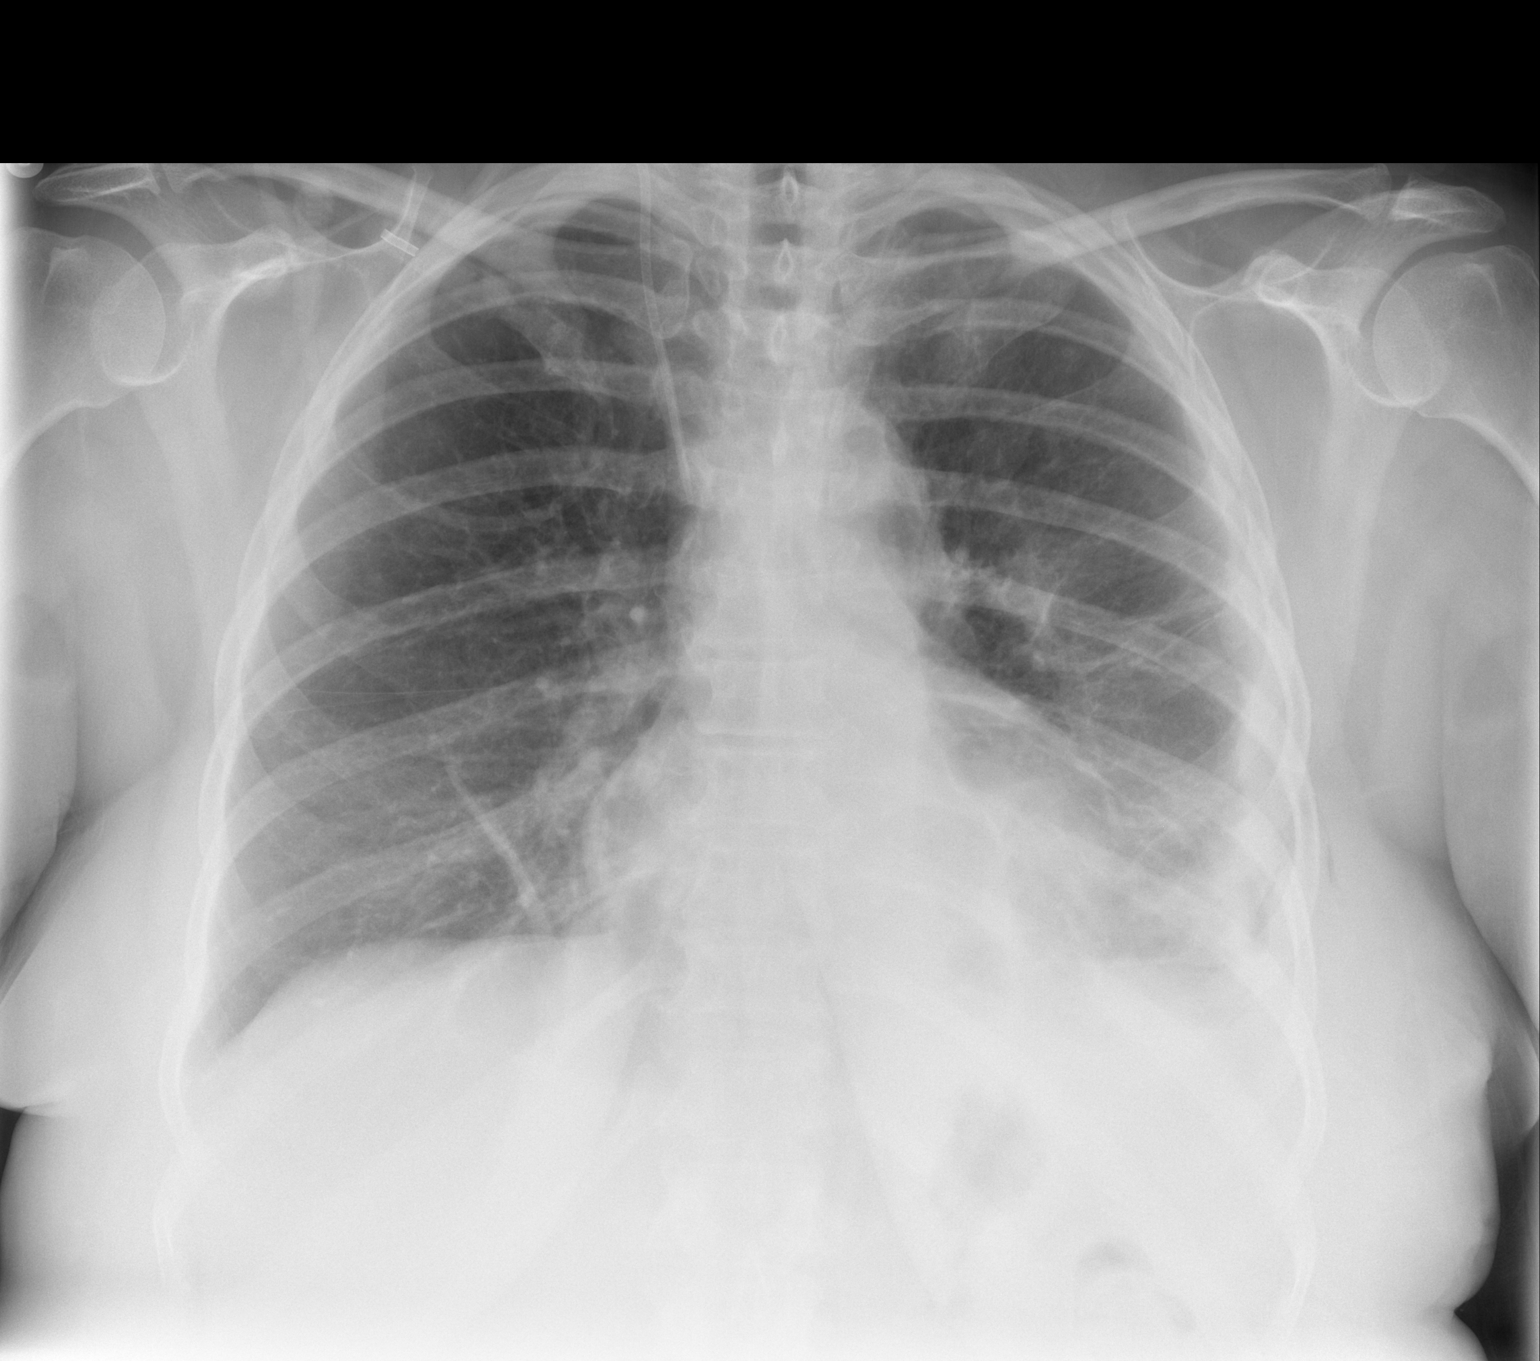

[w chest lat]
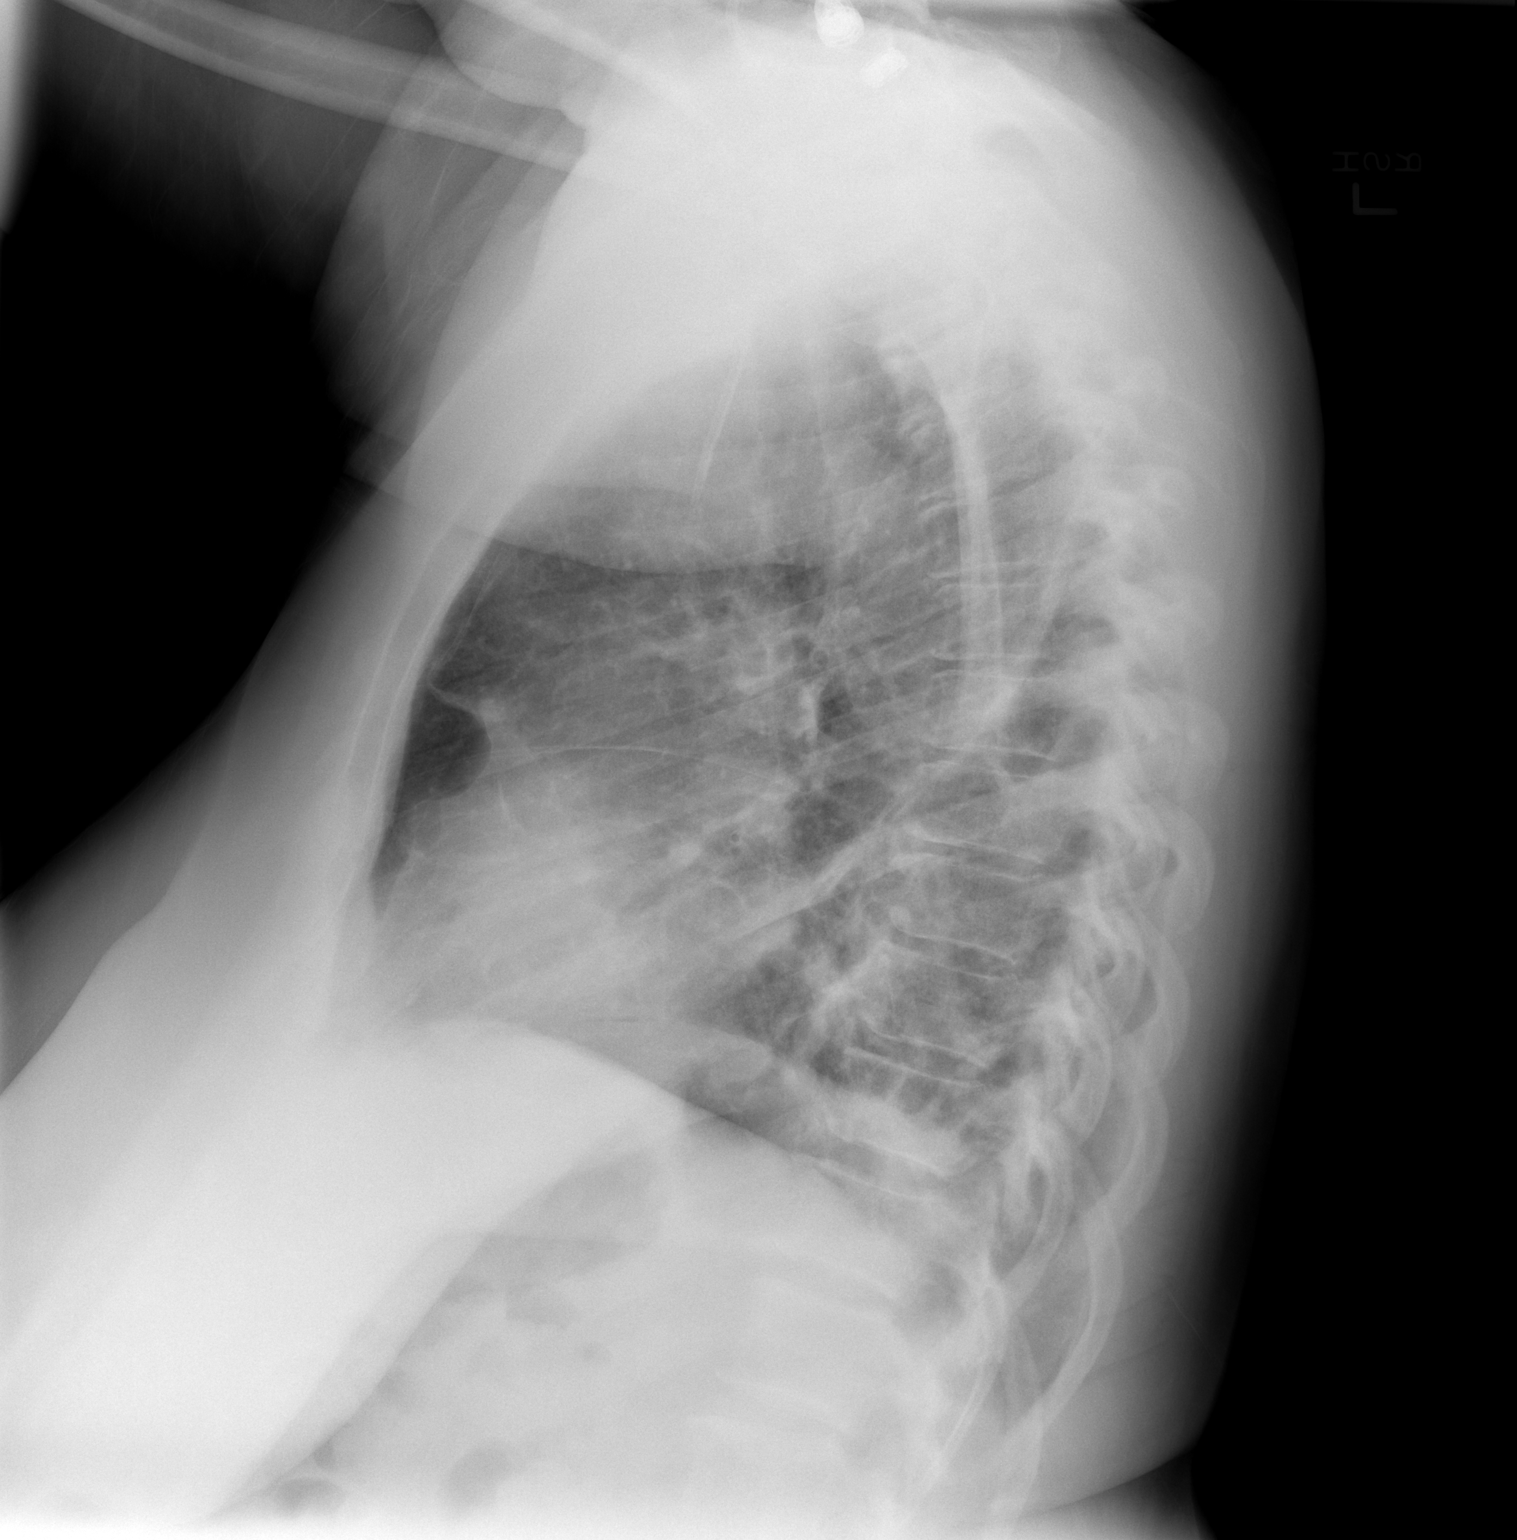

[2 of 2 positions shown; findings below may reference images not displayed]

FINDINGS: Right jugular catheter tip is in the SVC.  This is
unchanged.

Left lower lobe infiltrate is unchanged.  Small loculated
hydropneumothorax in the left lung base is unchanged.

There is mild linear atelectasis in the right lung base
IMPRESSION: Left lower lobe infiltrate unchanged.

Loculated left lower lobe hydropneumothorax, unchanged.

## 2009-09-12 ENCOUNTER — Ambulatory Visit: Payer: Self-pay | Admitting: Cardiothoracic Surgery

## 2009-09-29 ENCOUNTER — Ambulatory Visit: Payer: Self-pay | Admitting: Thoracic Surgery (Cardiothoracic Vascular Surgery)

## 2009-09-29 ENCOUNTER — Encounter
Admission: RE | Admit: 2009-09-29 | Discharge: 2009-09-29 | Payer: Self-pay | Admitting: Thoracic Surgery (Cardiothoracic Vascular Surgery)

## 2009-09-29 IMAGING — CR DG CHEST 2V
2 series · 2 of 2 positions shown · non-contrast
Comparison: Two-view chest x-ray [DATE].

CLINICAL DATA: Previous left VATS for empyema.

CHEST - 2 VIEW [DATE]:

[w chest pa]
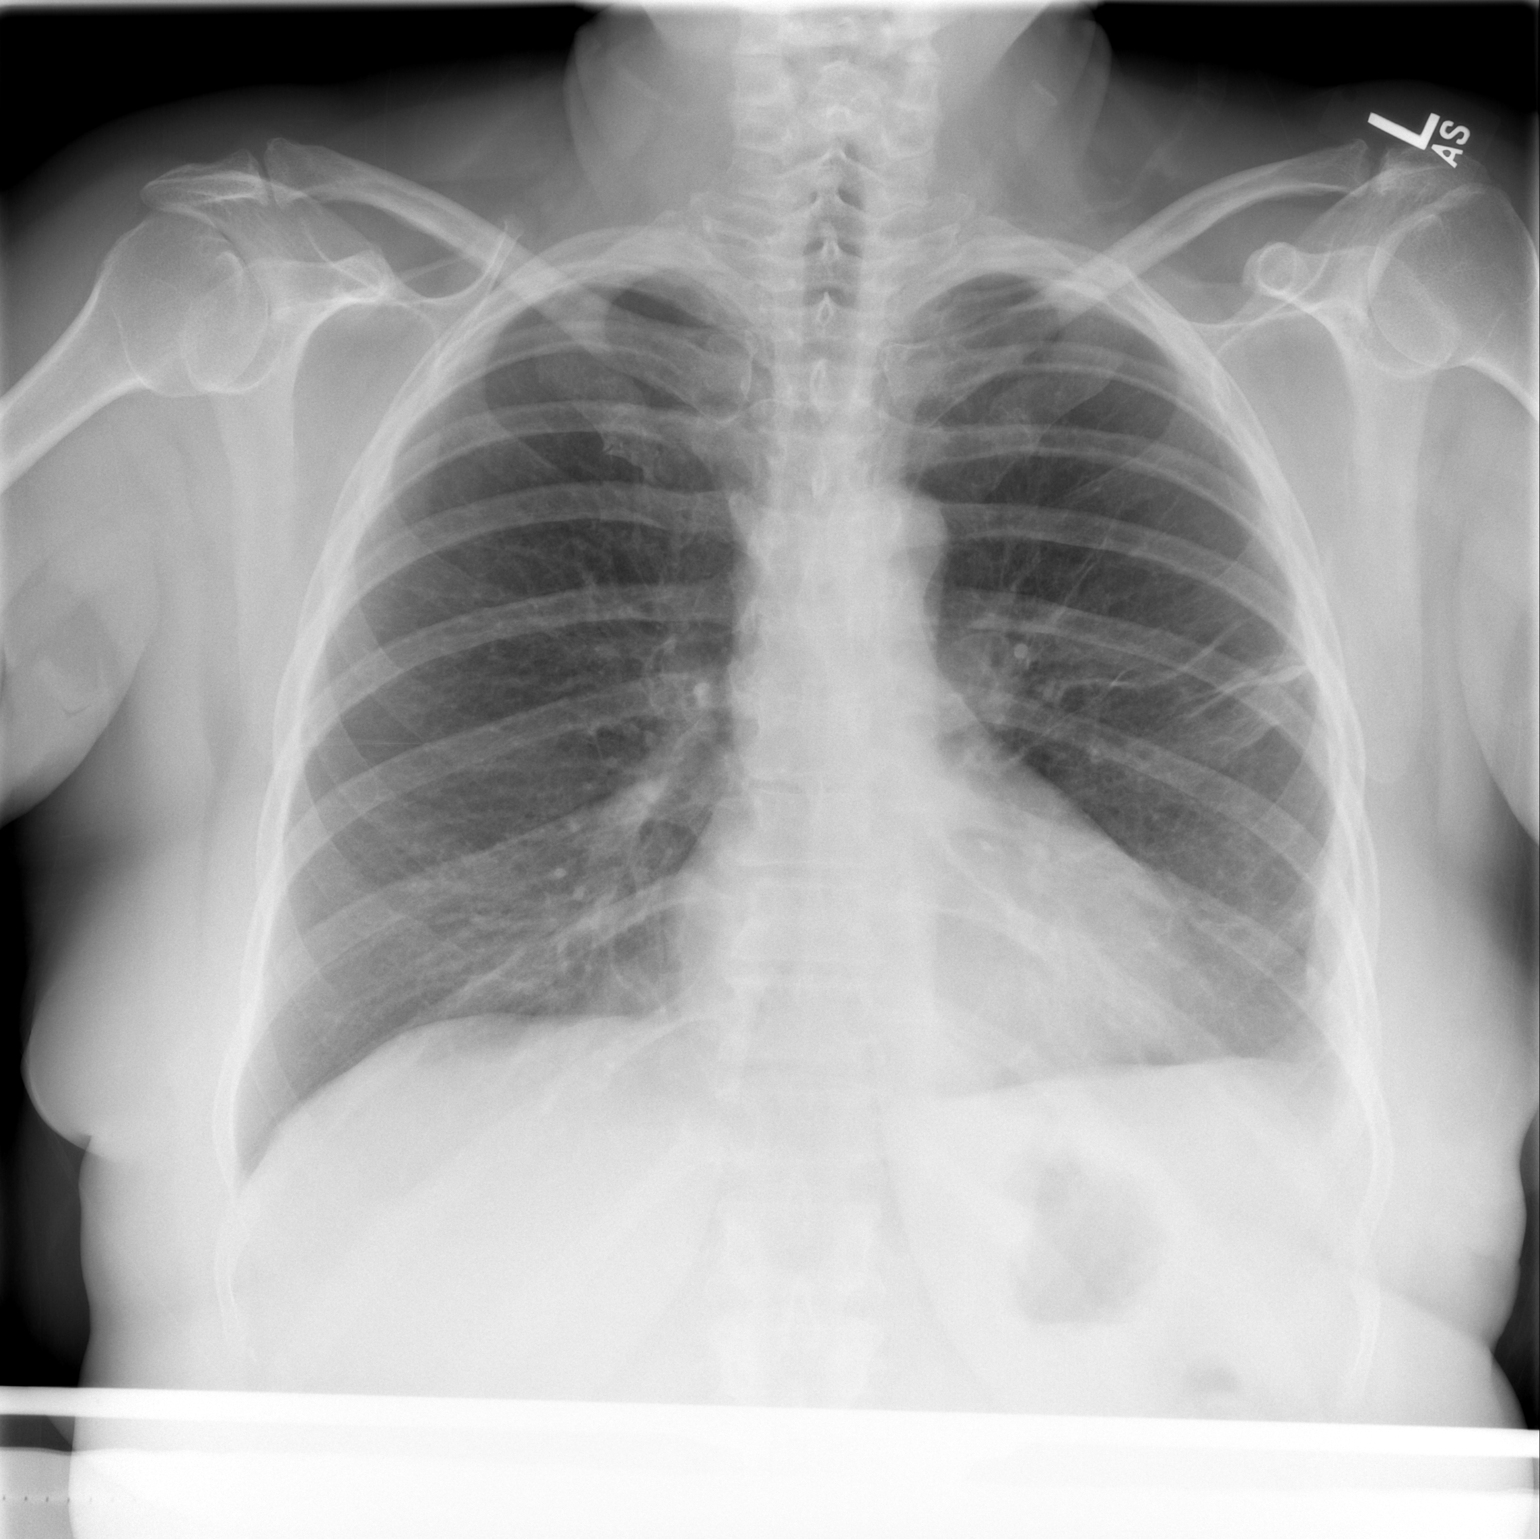

[w chest lat]
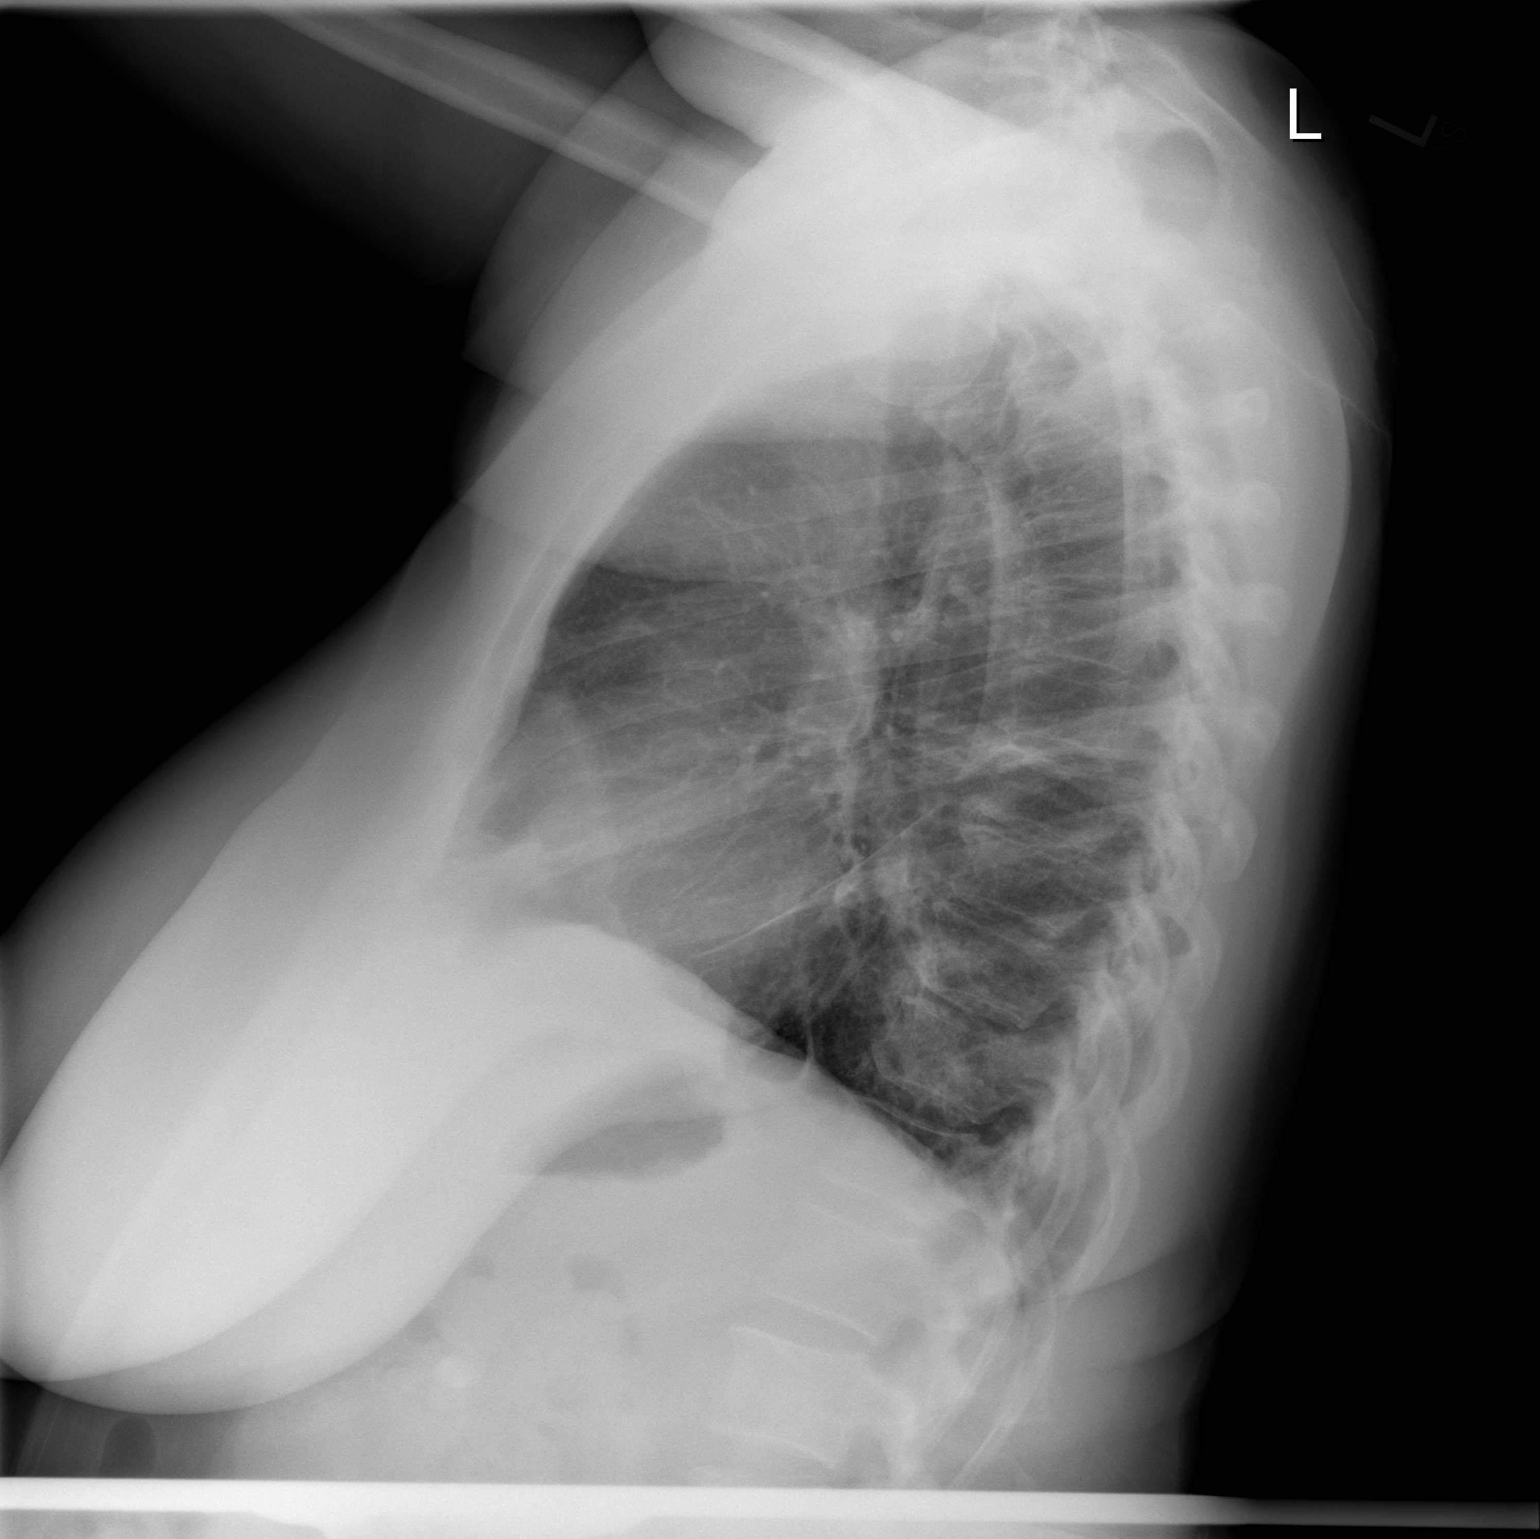

[2 of 2 positions shown; findings below may reference images not displayed]

FINDINGS: Cardiomediastinal silhouette unremarkable and unchanged.
Interval resolution of the small left hydropneumothorax.  Residual
pleuroparenchymal scarring at the left base.  Linear scarring in
the left upper lobe.  Right lung clear.  No pleural effusions.
Degenerative changes involving the thoracic spine.
IMPRESSION: Interval resolution of left hydropneumothorax.  Residual
pleuroparenchymal scarring at the left base.  Linear scarring in
the left upper lobe.  No acute cardiopulmonary disease.

## 2010-02-05 ENCOUNTER — Encounter: Payer: Self-pay | Admitting: Thoracic Surgery (Cardiothoracic Vascular Surgery)

## 2010-03-31 LAB — COMPREHENSIVE METABOLIC PANEL
ALT: 51 U/L — ABNORMAL HIGH (ref 0–35)
ALT: 50 U/L — ABNORMAL HIGH (ref 0–35)
AST: 33 U/L (ref 0–37)
AST: 28 U/L (ref 0–37)
Albumin: 1.7 g/dL — ABNORMAL LOW (ref 3.5–5.2)
Albumin: 2 g/dL — ABNORMAL LOW (ref 3.5–5.2)
Alkaline Phosphatase: 90 U/L (ref 39–117)
Alkaline Phosphatase: 105 U/L (ref 39–117)
BUN: 6 mg/dL (ref 6–23)
BUN: 15 mg/dL (ref 6–23)
CO2: 30 mEq/L (ref 19–32)
CO2: 25 mEq/L (ref 19–32)
Calcium: 7.9 mg/dL — ABNORMAL LOW (ref 8.4–10.5)
Calcium: 8.3 mg/dL — ABNORMAL LOW (ref 8.4–10.5)
Chloride: 97 mEq/L (ref 96–112)
Chloride: 105 mEq/L (ref 96–112)
Creatinine, Ser: 0.85 mg/dL (ref 0.4–1.2)
Creatinine, Ser: 1 mg/dL (ref 0.4–1.2)
GFR calc Af Amer: >60 mL/min (ref >60)
GFR calc Af Amer: >60 mL/min (ref >60)
GFR calc non Af Amer: >60 mL/min (ref >60)
GFR calc non Af Amer: 58 mL/min — ABNORMAL LOW (ref >60)
Glucose, Bld: 156 mg/dL — ABNORMAL HIGH (ref 70–99)
Glucose, Bld: 151 mg/dL — ABNORMAL HIGH (ref 70–99)
Potassium: 3.7 mEq/L (ref 3.5–5.1)
Potassium: 3 mEq/L — ABNORMAL LOW (ref 3.5–5.1)
Sodium: 134 mEq/L — ABNORMAL LOW (ref 135–145)
Sodium: 139 mEq/L (ref 135–145)
Total Bilirubin: 0.4 mg/dL (ref 0.3–1.2)
Total Bilirubin: 0.5 mg/dL (ref 0.3–1.2)
Total Protein: 5.3 g/dL — ABNORMAL LOW (ref 6.0–8.3)
Total Protein: 6.4 g/dL (ref 6.0–8.3)

## 2010-03-31 LAB — DIFFERENTIAL
Basophils Absolute: 0 10*3/uL (ref 0.0–0.1)
Basophils Relative: 0 % (ref 0–1)
Eosinophils Absolute: 0.1 10*3/uL (ref 0.0–0.7)
Eosinophils Relative: 0 % (ref 0–5)
Lymphocytes Relative: 15 % (ref 12–46)
Lymphs Abs: 2.9 10*3/uL (ref 0.7–4.0)
Monocytes Absolute: 1.2 10*3/uL — ABNORMAL HIGH (ref 0.1–1.0)
Monocytes Relative: 6 % (ref 3–12)
Neutro Abs: 15.3 10*3/uL — ABNORMAL HIGH (ref 1.7–7.7)
Neutrophils Relative %: 78 % — ABNORMAL HIGH (ref 43–77)

## 2010-03-31 LAB — BASIC METABOLIC PANEL
BUN: 10 mg/dL (ref 6–23)
BUN: 17 mg/dL (ref 6–23)
BUN: 3 mg/dL — ABNORMAL LOW (ref 6–23)
CO2: 29 mEq/L (ref 19–32)
CO2: 29 mEq/L (ref 19–32)
CO2: 33 mEq/L — ABNORMAL HIGH (ref 19–32)
Calcium: 7.7 mg/dL — ABNORMAL LOW (ref 8.4–10.5)
Calcium: 7.9 mg/dL — ABNORMAL LOW (ref 8.4–10.5)
Calcium: 8.4 mg/dL (ref 8.4–10.5)
Chloride: 101 mEq/L (ref 96–112)
Chloride: 95 mEq/L — ABNORMAL LOW (ref 96–112)
Chloride: 99 mEq/L (ref 96–112)
Creatinine, Ser: 0.77 mg/dL (ref 0.4–1.2)
Creatinine, Ser: 0.87 mg/dL (ref 0.4–1.2)
Creatinine, Ser: 1.09 mg/dL (ref 0.4–1.2)
GFR calc Af Amer: >60 mL/min (ref >60)
GFR calc Af Amer: >60 mL/min (ref >60)
GFR calc Af Amer: >60 mL/min (ref >60)
GFR calc non Af Amer: 53 mL/min — ABNORMAL LOW (ref >60)
GFR calc non Af Amer: >60 mL/min (ref >60)
GFR calc non Af Amer: >60 mL/min (ref >60)
Glucose, Bld: 119 mg/dL — ABNORMAL HIGH (ref 70–99)
Glucose, Bld: 120 mg/dL — ABNORMAL HIGH (ref 70–99)
Glucose, Bld: 158 mg/dL — ABNORMAL HIGH (ref 70–99)
Potassium: 3.7 mEq/L (ref 3.5–5.1)
Potassium: 3.7 mEq/L (ref 3.5–5.1)
Potassium: 4.4 mEq/L (ref 3.5–5.1)
Sodium: 133 mEq/L — ABNORMAL LOW (ref 135–145)
Sodium: 136 mEq/L (ref 135–145)
Sodium: 138 mEq/L (ref 135–145)

## 2010-03-31 LAB — TYPE AND SCREEN
ABO/RH(D): A POS
Antibody Screen: NEGATIVE

## 2010-03-31 LAB — RAPID URINE DRUG SCREEN, HOSP PERFORMED
Amphetamines: NOT DETECTED
Barbiturates: NOT DETECTED
Benzodiazepines: NOT DETECTED
Cocaine: NOT DETECTED
Opiates: NOT DETECTED
Tetrahydrocannabinol: NOT DETECTED

## 2010-03-31 LAB — POCT I-STAT 3, ART BLOOD GAS (G3+)
Acid-Base Excess: 2 mmol/L (ref 0.0–2.0)
Bicarbonate: 25.9 mEq/L — ABNORMAL HIGH (ref 20.0–24.0)
O2 Saturation: 91 %
Patient temperature: 98.7
TCO2: 27 mmol/L (ref 0–100)
pCO2 arterial: 37.9 mmHg (ref 35.0–45.0)
pH, Arterial: 7.443 — ABNORMAL HIGH (ref 7.350–7.400)
pO2, Arterial: 60 mmHg — ABNORMAL LOW (ref 80.0–100.0)

## 2010-03-31 LAB — BODY FLUID CULTURE
Culture: NO GROWTH
Culture: NO GROWTH
Culture: NO GROWTH

## 2010-03-31 LAB — CBC
HCT: 36.3 % (ref 36.0–46.0)
HCT: 37.9 % (ref 36.0–46.0)
HCT: 38.5 % (ref 36.0–46.0)
HCT: 45.8 % (ref 36.0–46.0)
Hemoglobin: 11.9 g/dL — ABNORMAL LOW (ref 12.0–15.0)
Hemoglobin: 12.7 g/dL (ref 12.0–15.0)
Hemoglobin: 12.8 g/dL (ref 12.0–15.0)
Hemoglobin: 16 g/dL — ABNORMAL HIGH (ref 12.0–15.0)
MCH: 30.4 pg (ref 26.0–34.0)
MCH: 31.1 pg (ref 26.0–34.0)
MCH: 31.2 pg (ref 26.0–34.0)
MCH: 32 pg (ref 26.0–34.0)
MCHC: 32.8 g/dL (ref 30.0–36.0)
MCHC: 33.2 g/dL (ref 30.0–36.0)
MCHC: 33.5 g/dL (ref 30.0–36.0)
MCHC: 34.9 g/dL (ref 30.0–36.0)
MCV: 92.7 fL (ref 78.0–100.0)
MCV: 92.8 fL (ref 78.0–100.0)
MCV: 93.9 fL (ref 78.0–100.0)
MCV: 91.6 fL (ref 78.0–100.0)
Platelets: 395 10*3/uL (ref 150–400)
Platelets: 442 10*3/uL — ABNORMAL HIGH (ref 150–400)
Platelets: 450 10*3/uL — ABNORMAL HIGH (ref 150–400)
Platelets: 418 10*3/uL — ABNORMAL HIGH (ref 150–400)
RBC: 3.91 MIL/uL (ref 3.87–5.11)
RBC: 4.09 MIL/uL (ref 3.87–5.11)
RBC: 4.1 MIL/uL (ref 3.87–5.11)
RBC: 5 MIL/uL (ref 3.87–5.11)
RDW: 13.3 % (ref 11.5–15.5)
RDW: 13.4 % (ref 11.5–15.5)
RDW: 13.5 % (ref 11.5–15.5)
RDW: 12.9 % (ref 11.5–15.5)
WBC: 15.2 10*3/uL — ABNORMAL HIGH (ref 4.0–10.5)
WBC: 17.8 10*3/uL — ABNORMAL HIGH (ref 4.0–10.5)
WBC: 21.9 10*3/uL — ABNORMAL HIGH (ref 4.0–10.5)
WBC: 19.5 10*3/uL — ABNORMAL HIGH (ref 4.0–10.5)

## 2010-03-31 LAB — BLOOD GAS, ARTERIAL
Acid-Base Excess: 1.8 mmol/L (ref 0.0–2.0)
Acid-Base Excess: 4.5 mmol/L — ABNORMAL HIGH (ref 0.0–2.0)
Bicarbonate: 27.2 mEq/L — ABNORMAL HIGH (ref 20.0–24.0)
Bicarbonate: 28.9 mEq/L — ABNORMAL HIGH (ref 20.0–24.0)
Drawn by: 244901
Drawn by: 31996
FIO2: 1 %
MECHVT: 450 mL
O2 Content: 4 L/min
O2 Saturation: 91.2 %
O2 Saturation: 97.5 %
PEEP: 5 cmH2O
Patient temperature: 98.5
Patient temperature: 98.6
RATE: 15 resp/min
TCO2: 28.8 mmol/L (ref 0–100)
TCO2: 30.4 mmol/L (ref 0–100)
pCO2 arterial: 46.7 mmHg — ABNORMAL HIGH (ref 35.0–45.0)
pCO2 arterial: 53.2 mmHg — ABNORMAL HIGH (ref 35.0–45.0)
pH, Arterial: 7.328 — ABNORMAL LOW (ref 7.350–7.400)
pH, Arterial: 7.408 — ABNORMAL HIGH (ref 7.350–7.400)
pO2, Arterial: 112 mmHg — ABNORMAL HIGH (ref 80.0–100.0)
pO2, Arterial: 60.9 mmHg — ABNORMAL LOW (ref 80.0–100.0)

## 2010-03-31 LAB — TISSUE CULTURE
Culture: NO GROWTH
Culture: NO GROWTH
Culture: NO GROWTH

## 2010-03-31 LAB — POCT I-STAT, CHEM 8
BUN: 16 mg/dL (ref 6–23)
Calcium, Ion: 1.06 mmol/L — ABNORMAL LOW (ref 1.12–1.32)
Chloride: 104 mEq/L (ref 96–112)
Creatinine, Ser: 1.1 mg/dL (ref 0.4–1.2)
Glucose, Bld: 163 mg/dL — ABNORMAL HIGH (ref 70–99)
HCT: 51 % — ABNORMAL HIGH (ref 36.0–46.0)
Hemoglobin: 17.3 g/dL — ABNORMAL HIGH (ref 12.0–15.0)
Potassium: 3.3 mEq/L — ABNORMAL LOW (ref 3.5–5.1)
Sodium: 137 mEq/L (ref 135–145)
TCO2: 23 mmol/L (ref 0–100)

## 2010-03-31 LAB — AFB CULTURE WITH SMEAR (NOT AT ARMC)
Acid Fast Smear: NONE SEEN
Acid Fast Smear: NONE SEEN
Acid Fast Smear: NONE SEEN
Acid Fast Smear: NONE SEEN
Acid Fast Smear: NONE SEEN
Acid Fast Smear: NONE SEEN

## 2010-03-31 LAB — ANAEROBIC CULTURE

## 2010-03-31 LAB — FUNGUS CULTURE W SMEAR
Fungal Smear: NONE SEEN
Fungal Smear: NONE SEEN

## 2010-03-31 LAB — CULTURE, BLOOD (ROUTINE X 2)
Culture: NO GROWTH
Culture: NO GROWTH

## 2010-03-31 LAB — URINE MICROSCOPIC-ADD ON

## 2010-03-31 LAB — GLUCOSE, CAPILLARY: Glucose-Capillary: 202 mg/dL — ABNORMAL HIGH (ref 70–99)

## 2010-03-31 LAB — URINALYSIS, ROUTINE W REFLEX MICROSCOPIC
Glucose, UA: NEGATIVE mg/dL
Hgb urine dipstick: NEGATIVE
Ketones, ur: 15 mg/dL — AB
Leukocytes, UA: NEGATIVE
Nitrite: NEGATIVE
Protein, ur: 100 mg/dL — AB
Specific Gravity, Urine: 1.027 (ref 1.005–1.030)
Urobilinogen, UA: 1 mg/dL (ref 0.0–1.0)
pH: 6 (ref 5.0–8.0)

## 2010-03-31 LAB — CULTURE, RESPIRATORY W GRAM STAIN

## 2010-03-31 LAB — PROTIME-INR
INR: 1.12 (ref 0.00–1.49)
Prothrombin Time: 14.6 seconds (ref 11.6–15.2)

## 2010-03-31 LAB — BODY FLUID CELL COUNT WITH DIFFERENTIAL
Lymphs, Fluid: 7 %
Monocyte-Macrophage-Serous Fluid: 9 % — ABNORMAL LOW (ref 50–90)
Neutrophil Count, Fluid: 83 % — ABNORMAL HIGH (ref 0–25)
Total Nucleated Cell Count, Fluid: 14476 cu mm — ABNORMAL HIGH (ref 0–1000)

## 2010-03-31 LAB — GRAM STAIN

## 2010-03-31 LAB — MRSA PCR SCREENING: MRSA by PCR: NEGATIVE

## 2010-03-31 LAB — LACTATE DEHYDROGENASE, PLEURAL OR PERITONEAL FLUID: LD, Fluid: 1717 U/L — ABNORMAL HIGH (ref 3–23)

## 2010-03-31 LAB — D-DIMER, QUANTITATIVE (NOT AT ARMC): D-Dimer, Quant: >20 ug/mL-FEU — ABNORMAL HIGH (ref 0.00–0.48)

## 2010-03-31 LAB — AMYLASE, BODY FLUID: Amylase, Fluid: 19 U/L

## 2010-03-31 LAB — ABO/RH: ABO/RH(D): A POS

## 2010-03-31 LAB — GLUCOSE, SEROUS FLUID: Glucose, Fluid: <20 mg/dL

## 2010-03-31 LAB — CULTURE, RESPIRATORY

## 2010-03-31 LAB — MAGNESIUM: Magnesium: 2.1 mg/dL (ref 1.5–2.5)

## 2010-03-31 LAB — PROTEIN, BODY FLUID: Total protein, fluid: 4.7 g/dL

## 2010-03-31 LAB — HIV ANTIBODY (ROUTINE TESTING W REFLEX): HIV: NONREACTIVE

## 2010-05-30 NOTE — Assessment & Plan Note (Signed)
OFFICE VISIT   BERONICA, LANSDALE  DOB:  [birth year]                                        September 29, 2009  CHART #:  [id number]   REASON FOR VISIT:  Follow up after recent decortication.   HISTORY OF PRESENT ILLNESS:  The patient is a 55 year old woman who  presented with left-sided chest pain.  She had been having this pain for  about 2 weeks prior to admission.  She was found to have a left empyema  and underwent a left VATS and drainage of the empyema and decortication  on August 30, 2009.  She was treated with intravenous antibiotics for 7  days and then discharged with an additional 1 week of Augmentin.  She  now returns for follow up.  She is still having some discomfort related  to her incision.  She has not been having fevers or chills.  She got  short of breath when she went to the [place] football game and walked  essentially the entire stadium go the bathroom, but other than that she  has not had any shortness of breath.  She did have multiple questions  and complaints related to her hospitalization.   CURRENT MEDICATIONS:  Percocet and Aleve p.r.n.   PHYSICAL EXAMINATION:  The patient is a 55 year old woman in no acute  distress.  Blood pressure is 147/96, pulse 104, respirations are 18, her  oxygen saturation is 96% on room air.  Her lungs are clear with equal  breath sounds bilaterally.  Her incisions are healing well.  She does  have eschar and some erythema on the left wrist likely an arterial line  site.  This appears to be more local irritation than infection.   DIAGNOSTIC TESTS:  Chest x-ray shows good aeration of the lung, markedly  improved from her discharge film.   IMPRESSION:  The patient is now about a month out from a decortication.  Her cultures grew out only Peptostreptococcus which was the likely  culprit.  She was treated with a week of Zosyn followed by a week of  Augmentin.  She is afebrile and her chest x-ray is  clear.  I do not  think she needs any additional antibiotics at this time.  Her wounds are  healing well.  She really has no restrictions from a surgical  standpoint.  She will keep an eye on the eschar on her left wrist.  If  that were to show any increasing swelling or tenderness or drainage, she  will let us know right away.  She does not have a regular medical  doctor.  We will see if we can get her appointment with [facility] to  establish regular medical follow up.   Salvatore Decent Dorris Fetch, M.D.  Electronically Signed   SCH/MEDQ  D:  09/29/2009  T:  09/30/2009  Job:  [id number]

## 2014-02-15 DIAGNOSIS — I252 Old myocardial infarction: Secondary | ICD-10-CM

## 2014-02-15 DIAGNOSIS — I5030 Unspecified diastolic (congestive) heart failure: Secondary | ICD-10-CM

## 2014-02-15 HISTORY — DX: Unspecified diastolic (congestive) heart failure: I50.30

## 2014-02-15 HISTORY — DX: Old myocardial infarction: I25.2

## 2014-02-19 ENCOUNTER — Encounter (HOSPITAL_COMMUNITY): Payer: Self-pay | Admitting: Cardiovascular Disease

## 2014-02-19 ENCOUNTER — Encounter (HOSPITAL_COMMUNITY)
Admission: EM | Disposition: A | Discharge status: Home / Self Care | Payer: 59 | Source: Home / Self Care | Attending: Cardiovascular Disease

## 2014-02-19 ENCOUNTER — Inpatient Hospital Stay (HOSPITAL_COMMUNITY)
Admission: EM | Admit: 2014-02-19 | Discharge: 2014-02-22 | DRG: 246 | Disposition: A | Discharge status: Home / Self Care | Payer: 59 | Attending: Cardiovascular Disease | Admitting: Cardiovascular Disease

## 2014-02-19 ENCOUNTER — Emergency Department (HOSPITAL_COMMUNITY): Payer: 59

## 2014-02-19 DIAGNOSIS — I5031 Acute diastolic (congestive) heart failure: Secondary | ICD-10-CM | POA: Diagnosis present

## 2014-02-19 DIAGNOSIS — R55 Syncope and collapse: Secondary | ICD-10-CM | POA: Diagnosis not present

## 2014-02-19 DIAGNOSIS — I213 ST elevation (STEMI) myocardial infarction of unspecified site: Secondary | ICD-10-CM

## 2014-02-19 DIAGNOSIS — E1165 Type 2 diabetes mellitus with hyperglycemia: Secondary | ICD-10-CM | POA: Diagnosis present

## 2014-02-19 DIAGNOSIS — Z8249 Family history of ischemic heart disease and other diseases of the circulatory system: Secondary | ICD-10-CM

## 2014-02-19 DIAGNOSIS — N189 Chronic kidney disease, unspecified: Secondary | ICD-10-CM | POA: Diagnosis present

## 2014-02-19 DIAGNOSIS — Z955 Presence of coronary angioplasty implant and graft: Secondary | ICD-10-CM

## 2014-02-19 DIAGNOSIS — I219 Acute myocardial infarction, unspecified: Secondary | ICD-10-CM

## 2014-02-19 DIAGNOSIS — Z6841 Body Mass Index (BMI) 40.0 and over, adult: Secondary | ICD-10-CM

## 2014-02-19 DIAGNOSIS — Z87891 Personal history of nicotine dependence: Secondary | ICD-10-CM

## 2014-02-19 DIAGNOSIS — N179 Acute kidney failure, unspecified: Secondary | ICD-10-CM | POA: Diagnosis present

## 2014-02-19 DIAGNOSIS — I2119 ST elevation (STEMI) myocardial infarction involving other coronary artery of inferior wall: Principal | ICD-10-CM

## 2014-02-19 DIAGNOSIS — I2121 ST elevation (STEMI) myocardial infarction involving left circumflex coronary artery: Secondary | ICD-10-CM

## 2014-02-19 DIAGNOSIS — R001 Bradycardia, unspecified: Secondary | ICD-10-CM | POA: Diagnosis present

## 2014-02-19 DIAGNOSIS — E785 Hyperlipidemia, unspecified: Secondary | ICD-10-CM | POA: Diagnosis present

## 2014-02-19 DIAGNOSIS — R570 Cardiogenic shock: Secondary | ICD-10-CM | POA: Diagnosis present

## 2014-02-19 DIAGNOSIS — I2510 Atherosclerotic heart disease of native coronary artery without angina pectoris: Secondary | ICD-10-CM | POA: Diagnosis present

## 2014-02-19 HISTORY — PX: LEFT HEART CATH: SHX5478

## 2014-02-19 HISTORY — DX: Chronic kidney disease, unspecified: N18.9

## 2014-02-19 HISTORY — PX: CARDIAC CATHETERIZATION: SHX172

## 2014-02-19 HISTORY — DX: Atherosclerotic heart disease of native coronary artery without angina pectoris: I25.10

## 2014-02-19 HISTORY — DX: Personal history of nicotine dependence: Z87.891

## 2014-02-19 HISTORY — DX: Hyperglycemia, unspecified: R73.9

## 2014-02-19 HISTORY — DX: Obesity, unspecified: E66.9

## 2014-02-19 HISTORY — DX: Hyperlipidemia, unspecified: E78.5

## 2014-02-19 LAB — BASIC METABOLIC PANEL
Anion gap: 16 — ABNORMAL HIGH (ref 5–15)
BUN: 20 mg/dL (ref 6–23)
CO2: 19 mmol/L (ref 19–32)
Calcium: 8.9 mg/dL (ref 8.4–10.5)
Chloride: 104 mmol/L (ref 96–112)
Creatinine, Ser: 1.68 mg/dL — ABNORMAL HIGH (ref 0.50–1.10)
GFR calc Af Amer: 38 mL/min — ABNORMAL LOW (ref >90)
GFR calc non Af Amer: 33 mL/min — ABNORMAL LOW (ref >90)
Glucose, Bld: 292 mg/dL — ABNORMAL HIGH (ref 70–99)
Potassium: 3.3 mmol/L — ABNORMAL LOW (ref 3.5–5.1)
Sodium: 139 mmol/L (ref 135–145)

## 2014-02-19 LAB — CBC
HCT: 41 % (ref 36.0–46.0)
Hemoglobin: 13.8 g/dL (ref 12.0–15.0)
MCH: 29.9 pg (ref 26.0–34.0)
MCHC: 33.7 g/dL (ref 30.0–36.0)
MCV: 88.9 fL (ref 78.0–100.0)
Platelets: 267 10*3/uL (ref 150–400)
RBC: 4.61 MIL/uL (ref 3.87–5.11)
RDW: 12.4 % (ref 11.5–15.5)
WBC: 17.7 10*3/uL — ABNORMAL HIGH (ref 4.0–10.5)

## 2014-02-19 LAB — I-STAT CHEM 8, ED
BUN: 24 mg/dL — ABNORMAL HIGH (ref 6–23)
Calcium, Ion: 1.06 mmol/L — ABNORMAL LOW (ref 1.13–1.30)
Chloride: 102 mmol/L (ref 96–112)
Creatinine, Ser: 1.6 mg/dL — ABNORMAL HIGH (ref 0.50–1.10)
Glucose, Bld: 283 mg/dL — ABNORMAL HIGH (ref 70–99)
HCT: 46 % (ref 36.0–46.0)
Hemoglobin: 15.6 g/dL — ABNORMAL HIGH (ref 12.0–15.0)
Potassium: 3.3 mmol/L — ABNORMAL LOW (ref 3.5–5.1)
Sodium: 141 mmol/L (ref 135–145)
TCO2: 20 mmol/L (ref 0–100)

## 2014-02-19 LAB — DIFFERENTIAL
Basophils Absolute: 0 10*3/uL (ref 0.0–0.1)
Basophils Relative: 0 % (ref 0–1)
Eosinophils Absolute: 0.1 10*3/uL (ref 0.0–0.7)
Eosinophils Relative: 1 % (ref 0–5)
Lymphocytes Relative: 25 % (ref 12–46)
Lymphs Abs: 4.4 10*3/uL — ABNORMAL HIGH (ref 0.7–4.0)
Monocytes Absolute: 0.8 10*3/uL (ref 0.1–1.0)
Monocytes Relative: 4 % (ref 3–12)
Neutro Abs: 12.4 10*3/uL — ABNORMAL HIGH (ref 1.7–7.7)
Neutrophils Relative %: 70 % (ref 43–77)

## 2014-02-19 LAB — POCT I-STAT, CHEM 8
BUN: 24 mg/dL — ABNORMAL HIGH (ref 6–23)
Calcium, Ion: 1.06 mmol/L — ABNORMAL LOW (ref 1.12–1.23)
Chloride: 102 mmol/L (ref 96–112)
Creatinine, Ser: 1.6 mg/dL — ABNORMAL HIGH (ref 0.50–1.10)
Glucose, Bld: 283 mg/dL — ABNORMAL HIGH (ref 70–99)
HCT: 46 % (ref 36.0–46.0)
Hemoglobin: 15.6 g/dL — ABNORMAL HIGH (ref 12.0–15.0)
Potassium: 3.3 mmol/L — ABNORMAL LOW (ref 3.5–5.1)
Sodium: 141 mmol/L (ref 135–145)
TCO2: 20 mmol/L (ref 0–100)

## 2014-02-19 LAB — CBG MONITORING, ED: Glucose-Capillary: 226 mg/dL — ABNORMAL HIGH (ref 70–99)

## 2014-02-19 LAB — PROTIME-INR
INR: 1.05 (ref 0.00–1.49)
Prothrombin Time: 13.8 seconds (ref 11.6–15.2)

## 2014-02-19 LAB — I-STAT TROPONIN, ED: Troponin i, poc: 0.12 ng/mL (ref 0.00–0.08)

## 2014-02-19 LAB — APTT: aPTT: 21 seconds — ABNORMAL LOW (ref 24–37)

## 2014-02-19 IMAGING — CR DG CHEST 1V PORT
1 series · 1 of 1 positions shown · non-contrast
Comparison: None.

CLINICAL DATA: Code ST-elevation MI.  Hypotension.

EXAM:
PORTABLE CHEST - 1 VIEW

[AP]
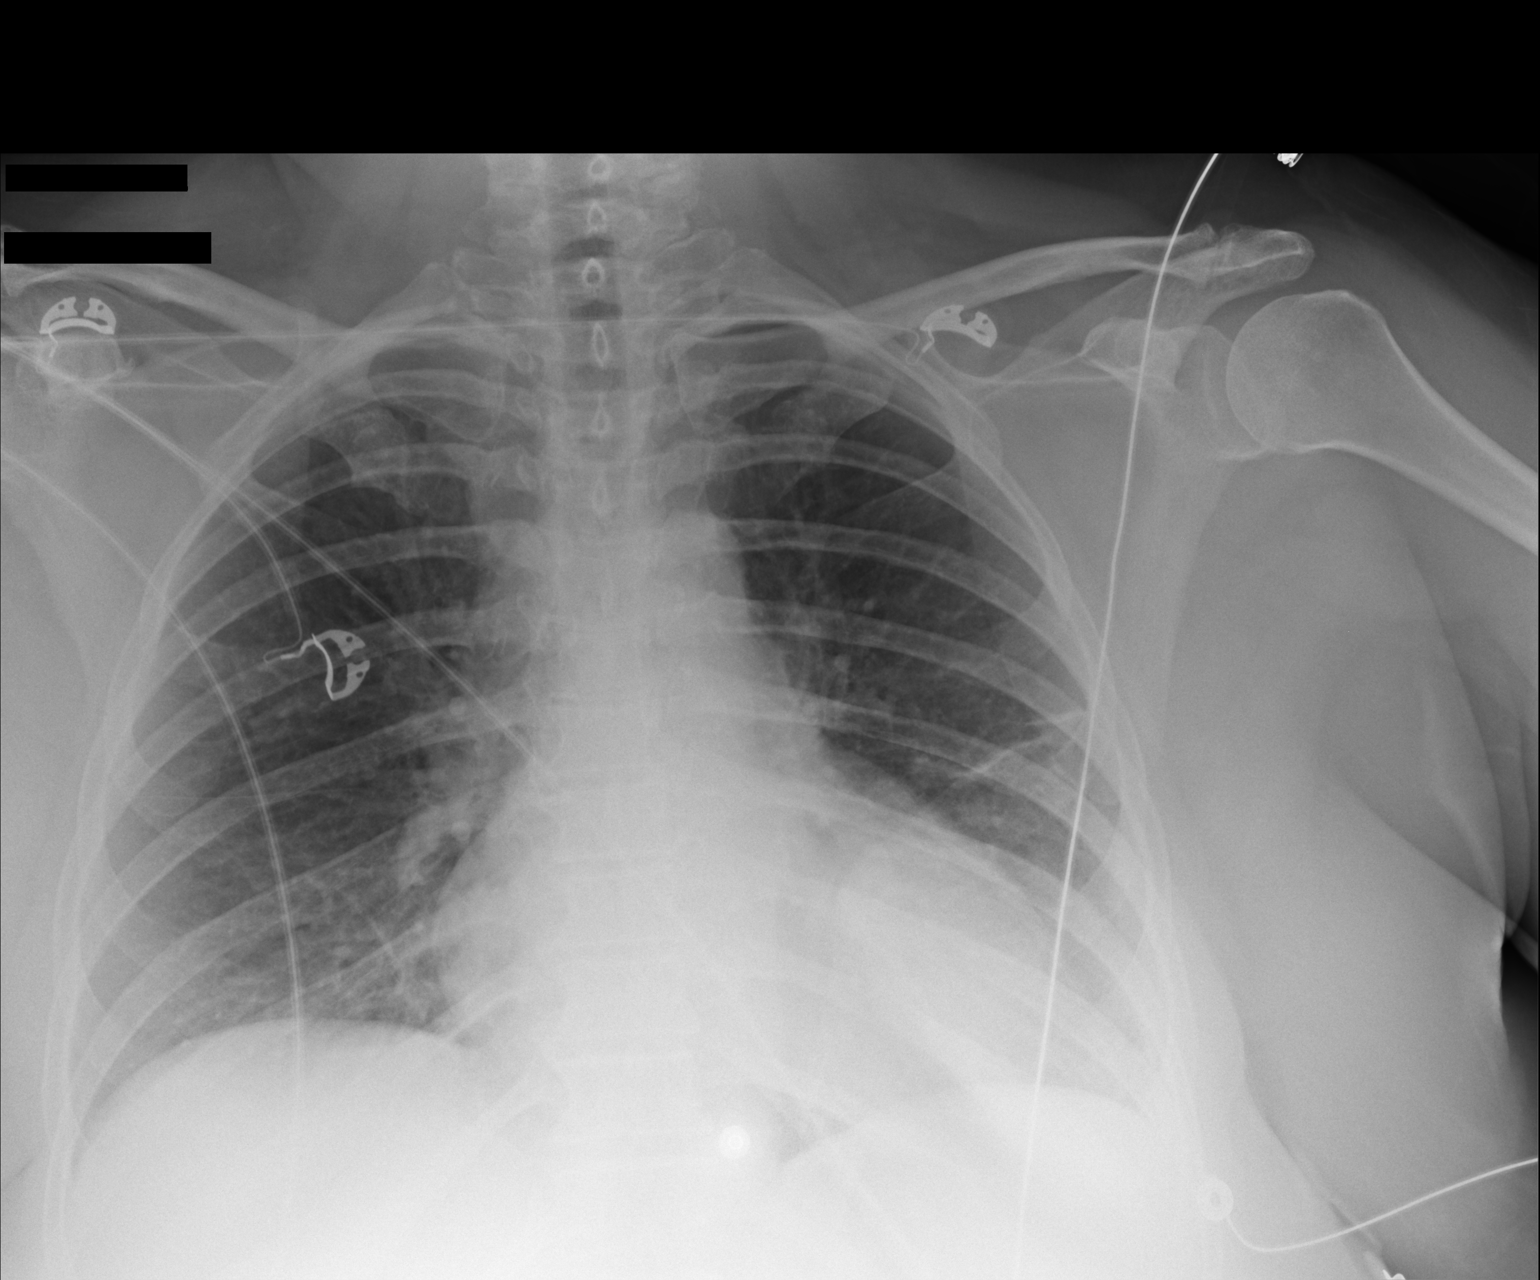

[1 of 1 positions shown; findings below may reference images not displayed]

FINDINGS: Lung volumes are low. Mild cardiomegaly. Pulmonary vasculature is
normal. There is linear atelectasis or scarring in the left midlung
zone. Overlying monitoring device projects over the lower
hemithorax. There is no consolidation, large pleural effusion or
pneumothorax. No acute osseous abnormalities.
IMPRESSION: Mild cardiomegaly. Linear atelectasis or scarring left midlung zone.

## 2014-02-19 SURGERY — LEFT HEART CATH
Anesthesia: LOCAL

## 2014-02-19 MED ORDER — SODIUM CHLORIDE 0.9 % IV SOLN: 4.0000 ug/kg/min | INTRAVENOUS | Status: AC

## 2014-02-19 MED ORDER — VERAPAMIL HCL 2.5 MG/ML IV SOLN
INTRAVENOUS | Status: AC
  Filled 2014-02-19: qty 2

## 2014-02-19 MED ORDER — BIVALIRUDIN 250 MG IV SOLR
INTRAVENOUS | Status: AC
  Filled 2014-02-19: qty 250

## 2014-02-19 MED ORDER — ONDANSETRON HCL 4 MG/2ML IJ SOLN
INTRAMUSCULAR | Status: AC
  Filled 2014-02-19: qty 2

## 2014-02-19 MED ORDER — NITROGLYCERIN 1 MG/10 ML FOR IR/CATH LAB
INTRA_ARTERIAL | Status: AC
  Filled 2014-02-19: qty 10

## 2014-02-19 MED ORDER — ASPIRIN 81 MG PO CHEW: 324.0000 mg | CHEWABLE_TABLET | Freq: Once | ORAL | Status: AC

## 2014-02-19 MED ORDER — NOREPINEPHRINE BITARTRATE 1 MG/ML IV SOLN
0.0000 ug/min | Freq: Once | INTRAVENOUS | Status: DC
  Filled 2014-02-19: qty 4

## 2014-02-19 MED ORDER — HEPARIN SODIUM (PORCINE) 5000 UNIT/ML IJ SOLN
4000.0000 [IU] | Freq: Once | INTRAMUSCULAR | Status: AC
  Administered 2014-02-19: 4000 [IU] via INTRAVENOUS
  Filled 2014-02-19: qty 1

## 2014-02-19 MED ORDER — HEPARIN (PORCINE) IN NACL 2-0.9 UNIT/ML-% IJ SOLN
INTRAMUSCULAR | Status: AC
  Filled 2014-02-19: qty 1500

## 2014-02-19 MED ORDER — SODIUM CHLORIDE 0.9 % IV SOLN
4.0000 ug/kg/min | INTRAVENOUS | Status: DC
  Filled 2014-02-19: qty 50

## 2014-02-19 MED ORDER — LIDOCAINE HCL (PF) 1 % IJ SOLN
INTRAMUSCULAR | Status: AC
  Filled 2014-02-19: qty 30

## 2014-02-19 MED ORDER — NOREPINEPHRINE BITARTRATE 1 MG/ML IV SOLN
0.0000 ug/min | INTRAVENOUS | Status: DC
  Filled 2014-02-19: qty 4

## 2014-02-19 MED ORDER — TICAGRELOR 90 MG PO TABS: 180.0000 mg | ORAL_TABLET | Freq: Once | ORAL | Status: DC

## 2014-02-19 MED ORDER — DOPAMINE-DEXTROSE 1.6-5 MG/ML-% IV SOLN
2.0000 ug/kg/min | Freq: Once | INTRAVENOUS | Status: DC
  Filled 2014-02-19: qty 250

## 2014-02-19 MED ORDER — DOPAMINE-DEXTROSE 3.2-5 MG/ML-% IV SOLN
0.0000 ug/kg/min | Freq: Once | INTRAVENOUS | Status: AC
Start: 2014-02-19 — End: 2014-02-19
  Administered 2014-02-19: 4.955 ug/kg/min via INTRAVENOUS

## 2014-02-19 MED ORDER — SODIUM CHLORIDE 0.9 % IV BOLUS (SEPSIS)
1000.0000 mL | Freq: Once | INTRAVENOUS | Status: AC
  Administered 2014-02-19: 1000 mL via INTRAVENOUS

## 2014-02-19 MED ORDER — SODIUM CHLORIDE 0.9 % IV BOLUS (SEPSIS): 1000.0000 mL | Freq: Once | INTRAVENOUS | Status: DC

## 2014-02-19 MED ORDER — DOPAMINE-DEXTROSE 3.2-5 MG/ML-% IV SOLN
INTRAVENOUS | Status: AC
  Administered 2014-02-19: 4.955 ug/kg/min via INTRAVENOUS
  Filled 2014-02-19: qty 250

## 2014-02-19 MED ORDER — ONDANSETRON HCL 4 MG/2ML IJ SOLN
4.0000 mg | Freq: Once | INTRAMUSCULAR | Status: AC
  Administered 2014-02-19: 4 mg via INTRAVENOUS
  Filled 2014-02-19: qty 2

## 2014-02-19 MED ORDER — HEPARIN SODIUM (PORCINE) 1000 UNIT/ML IJ SOLN
INTRAMUSCULAR | Status: AC
  Filled 2014-02-19: qty 1

## 2014-02-19 MED ORDER — FENTANYL CITRATE 0.05 MG/ML IJ SOLN
INTRAMUSCULAR | Status: AC
  Filled 2014-02-19: qty 2

## 2014-02-19 MED ORDER — MIDAZOLAM HCL 2 MG/2ML IJ SOLN
INTRAMUSCULAR | Status: AC
  Filled 2014-02-19: qty 2

## 2014-02-19 MED ORDER — ATROPINE SULFATE 0.1 MG/ML IJ SOLN
INTRAMUSCULAR | Status: AC
  Filled 2014-02-19: qty 10

## 2014-02-19 NOTE — CV Procedure (Addendum)
Cardiac Catheterization Procedure Note  Name: Hannah Beard MRN: [id number] DOB: 04-Jan-1956  Procedure: Left Heart Cath, Selective Coronary Angiography, LV angiography, PTCA and stenting of the Left Circumflex  Indication: Acute inferior STEMI with Cardiogenic Shock. This is a 59 year old woman with no prior cardiac history. She has not had any regular medical care because of limited resources/lack of health insurance. Tonight she developed acute onset of nausea and vomiting. She had syncope. She complained of shortness of breath. Because of her profound symptoms, EMS was called. She was diagnosed with an acute inferior wall STEMI. The patient's initial presentation was consistent with cardiogenic shock and she had a systolic blood pressure of 60 mmHg. She required IV dopamine. Her electrocardiogram demonstrated junctional bradycardia with an acute inferoposterior/lateral MI. She was brought emergently to the cardiac catheterization lab for primary PCI.  Procedural Details:  The right wrist was prepped, draped, and anesthetized with 1% lidocaine. Using the modified Seldinger technique, a 5/6 French Slender sheath was introduced into the right radial artery. 3 mg of verapamil was administered through the sheath, weight-based unfractionated heparin was administered intravenously. Standard Judkins catheters were used for selective coronary angiography and left ventriculography. Catheter exchanges were performed over an exchange length guidewire.  PROCEDURAL FINDINGS Hemodynamics: AO 98/66 LV 98/31   Coronary angiography: Coronary dominance: left  Left mainstem: Widely patent with no obstruction. The vessel divides into the LAD and left circumflex.  Left anterior descending (LAD): The LAD is patent without obstructive disease. The first diagonal is patent. The LAD courses to the left ventricular apex and has only minor irregularities after the first diagonal branch.  Left circumflex (LCx):  Left circumflex is totally occluded proximally. The angiographic appearance is consistent with an abrupt/acute occlusion. After the vessel was reopened, it is shown to supply multiple obtuse marginal branches, left posterolateral branches, and the left PDA.  Right coronary artery (RCA): Small, nondominant vessel, supplying a single RV marginal branch. There appears to be moderate 50-60% stenosis in the mid vessel.  Left ventriculography: There is mild to moderate hypokinesis of the mid inferior wall. The other LV wall segments contract vigorously. The estimated LVEF is 65%.   PCI Note:  Following the diagnostic procedure, the decision was made to proceed with primary PCI of the left circumflex.  Weight-based bivalirudin was given for anticoagulation. She had received 3000 units of heparin on arrival to the cardiac catheterization lab. Because of active nausea and vomiting and initial inability to swallow medication, I elected to use intravenous cangrelor for antiplatelet Rx. This was administered via a bolus followed by infusion. Once a therapeutic ACT was achieved, a 6 [place] XB-3.5 cm guide catheter was inserted.  A cougar coronary guidewire was used to cross the lesion.  The lesion was predilated with a 2.5x12 mm balloon.  The lesion was then stented with a 3.5x16 mm Promus DES.  The stent was postdilated with a 4.0 mm noncompliant balloon to 12 atm.  Following PCI, there was 0% residual stenosis and TIMI-3 flow. Final angiography confirmed an excellent result. The patient tolerated the procedure well. There were no immediate procedural complications. A TR band was used for radial hemostasis. The patient was transferred to the post catheterization recovery area for further monitoring.  PCI Data: Vessel - LCx/Segment - proximal Percent Stenosis (pre)  100 TIMI-flow 0 Stent 3.5x16 mm Promus DES Percent Stenosis (post) 0 TIMI-flow (post) 3  Contrast: 105  Radiation dose/Fluoro time: 8.1  min  Estimated Blood Loss: minimal  Final  Conclusions:   1. Acute inferoposterior/lateral MI secondary to occlusion of a dominant left circumflex, treated successfully with primary PCI using a drug-eluting stent 2. Minimal nonobstructive disease of the LAD and mild to moderate nonobstructive stenosis of a nondominant RCA 3. Mild segmental contraction abnormality the left ventricle with preserved overall LVEF 4. Markedly elevated left ventricular end-diastolic pressure   Recommendations:  Post MI medical therapy with aggressive secondary risk reduction. May require IV diuresis based on high LV filling pressures. Monitor closely and CCU. The patient was loaded with ticagrelor 180 mg on the cath table. She should receive IV cangrelor x 2 hours. Bivalirudin was discontinued post-PCI in the cath lab.  Sherren Mocha MD, [facility] 02/19/2014, 11:45 PM

## 2014-02-19 NOTE — ED Notes (Addendum)
Pt transported to Cath lab

## 2014-02-19 NOTE — ED Notes (Signed)
Pt CBG result 226. Informed RN.

## 2014-02-19 NOTE — ED Notes (Addendum)
Reather Converse, MD and Hanley Ben, MD at bedside. EKG done- Code STEMI called.

## 2014-02-19 NOTE — ED Notes (Signed)
Code Stemi activated @ 2153

## 2014-02-19 NOTE — ED Notes (Signed)
Cath lab states ready when Dr. Burt Knack arrives.

## 2014-02-19 NOTE — ED Notes (Signed)
cardiologist at bedside

## 2014-02-19 NOTE — ED Notes (Signed)
Dr Reather Converse given a copy of chem and troponin results

## 2014-02-19 NOTE — ED Notes (Addendum)
Pt from home for eval of syncope after eating. Pt states after she ate she went to use bathroom, had unwitnessed syncopal episode and woke up with feces and emesis noted. Pt denies any pain at this time. Pt reports light headedness and nausea. EMS noted st elevation to EKG and noted pt to be pale upon arrival. NAD noted. 324 mg of aspirin given en route.

## 2014-02-19 NOTE — Progress Notes (Signed)
Chaplain responded to Code STEMI. Daughter Levada Dy was in room while team was preparing patient for the cath lab.  Levada Dy states that her stepfather called at 8:15 PM to request that she come immediately because her mother was "very sick".  When daughter arrived, EMS was there.  Stepfather didn't come to the hospital because he had had some drinks.  Patient's son, Angela's brother, who lives with patient, has been notified but won't be coming to the hospital at this time.  Patient was nauseous before and during transport to cath lab.  Daughter is anxious and somewhat teary.  Chaplain oriented daughter to waiting area and offered emotional support. Chaplain noted discrepancy in age and notified ED registration that patient's birth year is [birth year], per daughter, not 10. Please call if further support is needed.   Luana Shu [phone]    02/19/14 2200  Clinical Encounter Type  Visited With Family  Visit Type Social support  Referral From (STEMI)

## 2014-02-20 ENCOUNTER — Encounter (HOSPITAL_COMMUNITY): Payer: Self-pay | Admitting: Cardiovascular Disease

## 2014-02-20 ENCOUNTER — Inpatient Hospital Stay (HOSPITAL_COMMUNITY): Payer: 59

## 2014-02-20 DIAGNOSIS — R072 Precordial pain: Secondary | ICD-10-CM | POA: Diagnosis not present

## 2014-02-20 DIAGNOSIS — I5031 Acute diastolic (congestive) heart failure: Secondary | ICD-10-CM | POA: Diagnosis present

## 2014-02-20 DIAGNOSIS — I2119 ST elevation (STEMI) myocardial infarction involving other coronary artery of inferior wall: Secondary | ICD-10-CM | POA: Diagnosis present

## 2014-02-20 DIAGNOSIS — N179 Acute kidney failure, unspecified: Secondary | ICD-10-CM | POA: Diagnosis not present

## 2014-02-20 DIAGNOSIS — I25118 Atherosclerotic heart disease of native coronary artery with other forms of angina pectoris: Secondary | ICD-10-CM | POA: Diagnosis not present

## 2014-02-20 DIAGNOSIS — R55 Syncope and collapse: Secondary | ICD-10-CM | POA: Diagnosis present

## 2014-02-20 DIAGNOSIS — Z8249 Family history of ischemic heart disease and other diseases of the circulatory system: Secondary | ICD-10-CM | POA: Diagnosis not present

## 2014-02-20 DIAGNOSIS — Z6841 Body Mass Index (BMI) 40.0 and over, adult: Secondary | ICD-10-CM | POA: Diagnosis not present

## 2014-02-20 DIAGNOSIS — Z87891 Personal history of nicotine dependence: Secondary | ICD-10-CM | POA: Diagnosis not present

## 2014-02-20 DIAGNOSIS — I2510 Atherosclerotic heart disease of native coronary artery without angina pectoris: Secondary | ICD-10-CM | POA: Diagnosis present

## 2014-02-20 DIAGNOSIS — R570 Cardiogenic shock: Secondary | ICD-10-CM | POA: Diagnosis present

## 2014-02-20 DIAGNOSIS — E785 Hyperlipidemia, unspecified: Secondary | ICD-10-CM | POA: Diagnosis present

## 2014-02-20 DIAGNOSIS — N183 Chronic kidney disease, stage 3 (moderate): Secondary | ICD-10-CM

## 2014-02-20 DIAGNOSIS — R001 Bradycardia, unspecified: Secondary | ICD-10-CM | POA: Diagnosis present

## 2014-02-20 DIAGNOSIS — E1165 Type 2 diabetes mellitus with hyperglycemia: Secondary | ICD-10-CM | POA: Diagnosis present

## 2014-02-20 DIAGNOSIS — I213 ST elevation (STEMI) myocardial infarction of unspecified site: Secondary | ICD-10-CM

## 2014-02-20 DIAGNOSIS — N189 Chronic kidney disease, unspecified: Secondary | ICD-10-CM | POA: Diagnosis present

## 2014-02-20 LAB — MRSA PCR SCREENING: MRSA by PCR: NEGATIVE

## 2014-02-20 LAB — CBC
HCT: 43.2 % (ref 36.0–46.0)
Hemoglobin: 14.9 g/dL (ref 12.0–15.0)
MCH: 30.7 pg (ref 26.0–34.0)
MCHC: 34.5 g/dL (ref 30.0–36.0)
MCV: 88.9 fL (ref 78.0–100.0)
Platelets: 225 10*3/uL (ref 150–400)
RBC: 4.86 MIL/uL (ref 3.87–5.11)
RDW: 12.6 % (ref 11.5–15.5)
WBC: 14.2 10*3/uL — ABNORMAL HIGH (ref 4.0–10.5)

## 2014-02-20 LAB — LIPID PANEL
Cholesterol: 221 mg/dL — ABNORMAL HIGH (ref 0–200)
HDL: 42 mg/dL (ref >39)
LDL Cholesterol: 148 mg/dL — ABNORMAL HIGH (ref 0–99)
Total CHOL/HDL Ratio: 5.3 RATIO
Triglycerides: 156 mg/dL — ABNORMAL HIGH (ref <150)
VLDL: 31 mg/dL (ref 0–40)

## 2014-02-20 LAB — GLUCOSE, CAPILLARY
Glucose-Capillary: 114 mg/dL — ABNORMAL HIGH (ref 70–99)
Glucose-Capillary: 117 mg/dL — ABNORMAL HIGH (ref 70–99)
Glucose-Capillary: 120 mg/dL — ABNORMAL HIGH (ref 70–99)
Glucose-Capillary: 154 mg/dL — ABNORMAL HIGH (ref 70–99)
Glucose-Capillary: 213 mg/dL — ABNORMAL HIGH (ref 70–99)

## 2014-02-20 LAB — BASIC METABOLIC PANEL
Anion gap: 14 (ref 5–15)
BUN: 18 mg/dL (ref 6–23)
CO2: 18 mmol/L — ABNORMAL LOW (ref 19–32)
Calcium: 8.5 mg/dL (ref 8.4–10.5)
Chloride: 107 mmol/L (ref 96–112)
Creatinine, Ser: 1.41 mg/dL — ABNORMAL HIGH (ref 0.50–1.10)
GFR calc Af Amer: 47 mL/min — ABNORMAL LOW (ref >90)
GFR calc non Af Amer: 40 mL/min — ABNORMAL LOW (ref >90)
Glucose, Bld: 137 mg/dL — ABNORMAL HIGH (ref 70–99)
Potassium: 3.9 mmol/L (ref 3.5–5.1)
Sodium: 139 mmol/L (ref 135–145)

## 2014-02-20 IMAGING — CR DG CHEST 1V PORT
1 series · 1 of 1 positions shown · non-contrast
Comparison: [DATE]

CLINICAL DATA: Subsequent evaluation for acute myocardial
infarction

EXAM:
PORTABLE CHEST - 1 VIEW

[AP]
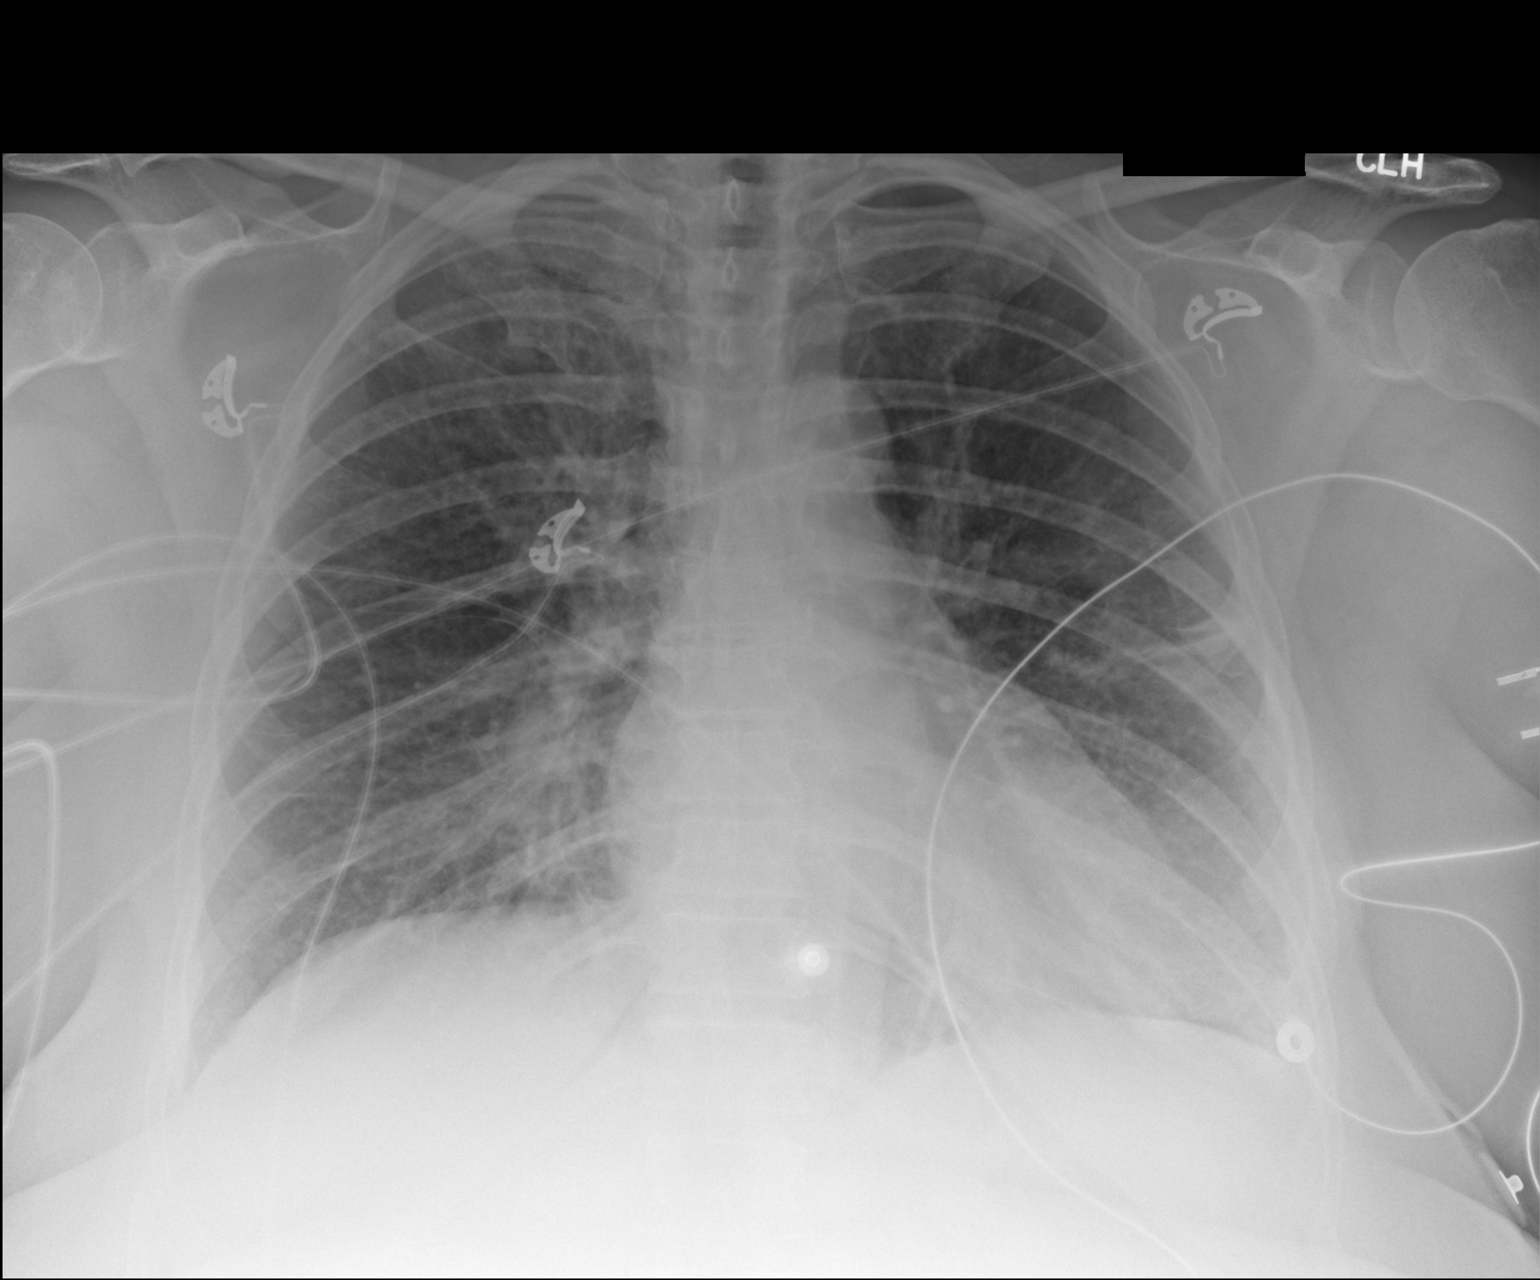

[1 of 1 positions shown; findings below may reference images not displayed]

FINDINGS: Heart size mildly enlarged but stable. Mild vascular congestion with
no evidence of pulmonary edema. Stable scarring or discoid
atelectasis in the lingula. No pleural effusion.
IMPRESSION: Stable mild cardiac enlargement.

## 2014-02-20 MED ORDER — LISINOPRIL 2.5 MG PO TABS
2.5000 mg | ORAL_TABLET | Freq: Every day | ORAL | Status: DC
  Administered 2014-02-21 – 2014-02-22 (×2): 2.5 mg via ORAL
  Filled 2014-02-20 (×2): qty 1

## 2014-02-20 MED ORDER — ASPIRIN 300 MG RE SUPP: 300.0000 mg | RECTAL | Status: DC

## 2014-02-20 MED ORDER — MORPHINE SULFATE 2 MG/ML IJ SOLN: 2.0000 mg | INTRAMUSCULAR | Status: DC | PRN

## 2014-02-20 MED ORDER — ACETAMINOPHEN 325 MG PO TABS
650.0000 mg | ORAL_TABLET | ORAL | Status: DC | PRN
  Administered 2014-02-22: 650 mg via ORAL
  Filled 2014-02-20: qty 2

## 2014-02-20 MED ORDER — INFLUENZA VAC SPLIT QUAD 0.5 ML IM SUSY
0.5000 mL | PREFILLED_SYRINGE | INTRAMUSCULAR | Status: DC
  Filled 2014-02-20: qty 0.5

## 2014-02-20 MED ORDER — METOPROLOL TARTRATE 12.5 MG HALF TABLET
12.5000 mg | ORAL_TABLET | Freq: Two times a day (BID) | ORAL | Status: DC
  Administered 2014-02-20 – 2014-02-22 (×5): 12.5 mg via ORAL
  Filled 2014-02-20 (×6): qty 1

## 2014-02-20 MED ORDER — ATORVASTATIN CALCIUM 80 MG PO TABS
80.0000 mg | ORAL_TABLET | Freq: Every day | ORAL | Status: DC
  Administered 2014-02-20 – 2014-02-21 (×2): 80 mg via ORAL
  Filled 2014-02-20 (×3): qty 1

## 2014-02-20 MED ORDER — METOPROLOL TARTRATE 12.5 MG HALF TABLET: 12.5000 mg | ORAL_TABLET | Freq: Two times a day (BID) | ORAL | Status: DC

## 2014-02-20 MED ORDER — INSULIN ASPART 100 UNIT/ML ~~LOC~~ SOLN
0.0000 [IU] | Freq: Every day | SUBCUTANEOUS | Status: DC
  Administered 2014-02-20: 2 [IU] via SUBCUTANEOUS

## 2014-02-20 MED ORDER — ASPIRIN 81 MG PO CHEW
81.0000 mg | CHEWABLE_TABLET | Freq: Every day | ORAL | Status: DC
  Administered 2014-02-20 – 2014-02-22 (×3): 81 mg via ORAL
  Filled 2014-02-20 (×3): qty 1

## 2014-02-20 MED ORDER — ONDANSETRON HCL 4 MG/2ML IJ SOLN: 4.0000 mg | Freq: Four times a day (QID) | INTRAMUSCULAR | Status: DC | PRN

## 2014-02-20 MED ORDER — INSULIN ASPART 100 UNIT/ML ~~LOC~~ SOLN: 0.0000 [IU] | Freq: Three times a day (TID) | SUBCUTANEOUS | Status: DC

## 2014-02-20 MED ORDER — OXYCODONE-ACETAMINOPHEN 5-325 MG PO TABS: 1.0000 | ORAL_TABLET | ORAL | Status: DC | PRN

## 2014-02-20 MED ORDER — INSULIN ASPART 100 UNIT/ML ~~LOC~~ SOLN: 0.0000 [IU] | Freq: Every day | SUBCUTANEOUS | Status: DC

## 2014-02-20 MED ORDER — FUROSEMIDE 10 MG/ML IJ SOLN: 40.0000 mg | Freq: Once | INTRAMUSCULAR | Status: DC

## 2014-02-20 MED ORDER — NITROGLYCERIN 0.4 MG SL SUBL: 0.4000 mg | SUBLINGUAL_TABLET | SUBLINGUAL | Status: DC | PRN

## 2014-02-20 MED ORDER — POTASSIUM CHLORIDE CRYS ER 20 MEQ PO TBCR
20.0000 meq | EXTENDED_RELEASE_TABLET | Freq: Once | ORAL | Status: AC
  Administered 2014-02-20: 20 meq via ORAL
  Filled 2014-02-20: qty 1

## 2014-02-20 MED ORDER — SODIUM CHLORIDE 0.9 % IV SOLN
INTRAVENOUS | Status: AC
  Administered 2014-02-20: 01:00:00 via INTRAVENOUS

## 2014-02-20 MED ORDER — FUROSEMIDE 10 MG/ML IJ SOLN
80.0000 mg | Freq: Once | INTRAMUSCULAR | Status: AC
  Administered 2014-02-20: 80 mg via INTRAVENOUS
  Filled 2014-02-20: qty 8

## 2014-02-20 MED ORDER — ACETAMINOPHEN 325 MG PO TABS: 650.0000 mg | ORAL_TABLET | ORAL | Status: DC | PRN

## 2014-02-20 MED ORDER — TICAGRELOR 90 MG PO TABS
90.0000 mg | ORAL_TABLET | Freq: Two times a day (BID) | ORAL | Status: DC
  Administered 2014-02-20 – 2014-02-22 (×5): 90 mg via ORAL
  Filled 2014-02-20 (×6): qty 1

## 2014-02-20 MED ORDER — ASPIRIN 81 MG PO CHEW: 324.0000 mg | CHEWABLE_TABLET | ORAL | Status: DC

## 2014-02-20 MED ORDER — ASPIRIN EC 81 MG PO TBEC: 81.0000 mg | DELAYED_RELEASE_TABLET | Freq: Every day | ORAL | Status: DC

## 2014-02-20 MED ORDER — LISINOPRIL 2.5 MG PO TABS: 2.5000 mg | ORAL_TABLET | Freq: Every day | ORAL | Status: DC

## 2014-02-20 MED ORDER — INSULIN ASPART 100 UNIT/ML ~~LOC~~ SOLN
0.0000 [IU] | Freq: Three times a day (TID) | SUBCUTANEOUS | Status: DC
  Administered 2014-02-20 – 2014-02-22 (×2): 3 [IU] via SUBCUTANEOUS

## 2014-02-20 MED ORDER — HEPARIN SODIUM (PORCINE) 5000 UNIT/ML IJ SOLN
5000.0000 [IU] | Freq: Three times a day (TID) | INTRAMUSCULAR | Status: DC
  Administered 2014-02-20 – 2014-02-22 (×6): 5000 [IU] via SUBCUTANEOUS
  Filled 2014-02-20 (×10): qty 1

## 2014-02-20 MED ORDER — HEPARIN SODIUM (PORCINE) 5000 UNIT/ML IJ SOLN: 5000.0000 [IU] | Freq: Three times a day (TID) | INTRAMUSCULAR | Status: DC

## 2014-02-20 MED ORDER — CETYLPYRIDINIUM CHLORIDE 0.05 % MT LIQD
7.0000 mL | Freq: Two times a day (BID) | OROMUCOSAL | Status: DC
  Administered 2014-02-20 – 2014-02-22 (×5): 7 mL via OROMUCOSAL

## 2014-02-20 MED ORDER — INFLUENZA VAC SPLIT QUAD 0.5 ML IM SUSY: 0.5000 mL | PREFILLED_SYRINGE | INTRAMUSCULAR | Status: DC

## 2014-02-20 NOTE — Progress Notes (Signed)
eLink Physician-Brief Progress Note Patient Name: Hannah Beard DOB: 02/05/1955 MRN: [id number]   Date of Service  02/20/2014  HPI/Events of Note  60 F with no PMH due to lack of insurance presenting with s/s of ACS - found to have acute inferior STEMI and cardiogenic shock.  Taken to cath lab emergently where patient received PCI to LCx and DES.  Patient is HD stable.  She does have elevated blood sugars and is on SSI  eICU Interventions  Plan of care per cardiology Continue to monitor via elink     Intervention Category Evaluation Type: New Patient Evaluation  DETERDING,ELIZABETH 02/20/2014, 12:56 AM

## 2014-02-20 NOTE — Progress Notes (Signed)
  Echocardiogram 2D Echocardiogram has been performed.  Lysle Rubens 02/20/2014, 9:38 AM

## 2014-02-20 NOTE — ED Provider Notes (Signed)
CSN: [id number]     Arrival date & time 02/19/14  2145 History   First MD Initiated Contact with Patient 02/19/14 2210     Chief Complaint  Patient presents with  . Code STEMI  . Hypotension     (Consider location/radiation/quality/duration/timing/severity/associated sxs/prior Treatment) HPI 59 year old female presents with nausea and shortness of breath which began this evening around 7 PM. Patient also reports having diaphoresis during this time. She has never had similar symptoms in the past. She denies having any cardiac history. Prior to today she was in her normal state health. Patient reports she has a significant family history of cardiac problems. Patient denies any abdominal pain, fevers, cough, leg pain, leg swelling. Patient states her symptoms have progressively worsened. No other complaints at this time.    Past Medical History  Diagnosis Date  . Acute MI, inferolateral wall, initial episode of care 02/19/2014   Past Surgical History  Procedure Laterality Date  . Left heart cath N/A 02/19/2014    Procedure: LEFT HEART CATH;  Surgeon: Blane Ohara, MD;  Location: [facility] CATH LAB;  Service: Cardiovascular;  Laterality: N/A;   No family history on file. History  Substance Use Topics  . Smoking status: Not on file  . Smokeless tobacco: Not on file  . Alcohol Use: Not on file   OB History    No data available     Review of Systems  Constitutional: Negative for fever and chills.  HENT: Negative for congestion, rhinorrhea and sore throat.   Eyes: Negative for visual disturbance.  Respiratory: Positive for shortness of breath. Negative for cough.   Cardiovascular: Negative for chest pain, palpitations and leg swelling.  Gastrointestinal: Positive for nausea. Negative for vomiting, abdominal pain, diarrhea and constipation.  Genitourinary: Negative for dysuria, hematuria, vaginal bleeding and vaginal discharge.  Musculoskeletal: Negative for back pain and neck pain.   Skin: Negative for rash.  Neurological: Negative for weakness and headaches.  All other systems reviewed and are negative.     Allergies  Review of patient's allergies indicates no known allergies.  Home Medications   Prior to Admission medications   Not on File   BP 102/57 mmHg  Pulse 89  Temp(Src) 98 F (36.7 C) (Oral)  Resp 20  Ht 5' 4.5" (1.638 m)  Wt 266 lb 12.1 oz (121 kg)  BMI 45.10 kg/m2  SpO2 94% Physical Exam  Constitutional: She is oriented to person, place, and time. She appears well-developed and well-nourished. She appears distressed.  Morbidly obese  HENT:  Head: Normocephalic and atraumatic.  Eyes: Conjunctivae are normal.  Neck: Normal range of motion.  Cardiovascular: Regular rhythm, normal heart sounds and intact distal pulses.  Bradycardia present.   No murmur heard. Pulmonary/Chest: Effort normal and breath sounds normal. No respiratory distress. She has no wheezes. She has no rales. She exhibits no tenderness.  Abdominal: Soft. Bowel sounds are normal. She exhibits no distension.  Musculoskeletal: Normal range of motion. She exhibits no edema or tenderness.  Neurological: She is alert and oriented to person, place, and time. No cranial nerve deficit.  Skin: Skin is warm. She is diaphoretic.  Psychiatric: She has a normal mood and affect.  Nursing note and vitals reviewed.   ED Course  Procedures (including critical care time) Labs Review Labs Reviewed  CBC - Abnormal; Notable for the following:    WBC 17.7 (*)    All other components within normal limits  DIFFERENTIAL - Abnormal; Notable for the following:  Neutro Abs 12.4 (*)    Lymphs Abs 4.4 (*)    All other components within normal limits  APTT - Abnormal; Notable for the following:    aPTT 21 (*)    All other components within normal limits  BASIC METABOLIC PANEL - Abnormal; Notable for the following:    Potassium 3.3 (*)    Glucose, Bld 292 (*)    Creatinine, Ser 1.68 (*)     GFR calc non Af Amer 33 (*)    GFR calc Af Amer 38 (*)    Anion gap 16 (*)    All other components within normal limits  GLUCOSE, CAPILLARY - Abnormal; Notable for the following:    Glucose-Capillary 213 (*)    All other components within normal limits  CBG MONITORING, ED - Abnormal; Notable for the following:    Glucose-Capillary 226 (*)    All other components within normal limits  I-STAT CHEM 8, ED - Abnormal; Notable for the following:    Potassium 3.3 (*)    BUN 24 (*)    Creatinine, Ser 1.60 (*)    Glucose, Bld 283 (*)    Calcium, Ion 1.06 (*)    Hemoglobin 15.6 (*)    All other components within normal limits  I-STAT TROPOININ, ED - Abnormal; Notable for the following:    Troponin i, poc 0.12 (*)    All other components within normal limits  POCT I-STAT, CHEM 8 - Abnormal; Notable for the following:    Potassium 3.3 (*)    BUN 24 (*)    Creatinine, Ser 1.60 (*)    Glucose, Bld 283 (*)    Calcium, Ion 1.06 (*)    Hemoglobin 15.6 (*)    All other components within normal limits  MRSA PCR SCREENING  PROTIME-INR  BASIC METABOLIC PANEL  CBC  LIPID PANEL  HEMOGLOBIN A1C    Imaging Review Dg Chest Portable 1 View  02/19/2014   CLINICAL DATA:  Code ST-elevation MI.  Hypotension.  EXAM: PORTABLE CHEST - 1 VIEW  COMPARISON:  None.  FINDINGS: Lung volumes are low. Mild cardiomegaly. Pulmonary vasculature is normal. There is linear atelectasis or scarring in the left midlung zone. Overlying monitoring device projects over the lower hemithorax. There is no consolidation, large pleural effusion or pneumothorax. No acute osseous abnormalities.  IMPRESSION: Mild cardiomegaly. Linear atelectasis or scarring left midlung zone.   Electronically Signed   By: Jeb Levering M.D.   On: 02/19/2014 22:22     EKG Interpretation None      MDM   Final diagnoses:  Acute MI   On arrival, EMS EKG reviewed which shows ST elevation in inferior leads with cervical changes. ED EKG shows  worsening of ST elevation in code STEMI was called this time. Patient blood pressure was 50 systolic and her heart rate was in the 40s. Resuscitation was begun patient was given IV fluids and cardiology was consulted. BP improved in ED with IVF. Chest x-ray is unremarkable. Cardiology is taking patient emergently to Cath Lab for PCI.  Clinical Impression: 1. Acute MI    Pt seen in conjunction with Dr. Thresa Ross, [facility] Resident - PGY-2     Kirstie Peri, MD 02/20/14 8182  Mariea Clonts, MD 02/20/14 [id number]

## 2014-02-20 NOTE — H&P (Signed)
Admission History and Physical     Patient ID: Hannah Beard, MRN: [id number], DOB: 1955/09/26 59 y.o. Date of Encounter: 02/20/2014, 12:07 AM  Primary Physician: No primary care provider on file.  Chief Complaint:  STEMI  History of Present Illness: Hannah Beard is a 59 y.o. female without significant PMHx, not on any meds at home.  Tonight just after dinner she developed lightheadedness then syncope with nausea/vomiting and shortness of breath.  No chest pain.  EMS was called and noted inferior ST elevation on ECG.  She was also pale and hypotensive with SBP of 60.  She was given 324mg  of ASA but no NTG.    In the ED she remained hypotensive and the concern was for RV infarct.  34mm ST elevation inferiorly on ECG with junctional bradycardia.  IV fluids were started as well as dopamine at 5 and SBP improved to 90.  She was given zofran and heparin in ED as well, and was transported up to the cath lab for primary PCI.    Past Medical History  Diagnosis Date  . Acute MI, inferolateral wall, initial episode of care 02/19/2014     Past Surgical History  Procedure Laterality Date  . Left heart cath N/A 02/19/2014    Procedure: LEFT HEART CATH;  Surgeon: Blane Ohara, MD;  Location: [facility] CATH LAB;  Service: Cardiovascular;  Laterality: N/A;      Current Facility-Administered Medications  Medication Dose Route Frequency Provider Last Rate Last Dose  . cangrelor [facility]) 50 mg in sodium chloride 0.9 % 250 mL (0.2 mg/mL) infusion  4 mcg/kg/min Intravenous Continuous Blane Ohara, MD      . norepinephrine (LEVOPHED) 4 mg in dextrose 5 % 250 mL (0.016 mg/mL) infusion  0-40 mcg/min Intravenous Titrated Blane Ohara, MD      . sodium chloride 0.9 % bolus 1,000 mL  1,000 mL Intravenous Once Kirstie Peri, MD      . ticagrelor Kary Kos) tablet 180 mg  180 mg Oral Once Blane Ohara, MD          Allergies: No Known Allergies   Social History:  The patient     Family  History:  Significant family history of CAD including brother with MI at age 10, mother, and maternal grandparents.  ROS:  Please see the history of present illness.  All other systems reviewed and negative.   Vital Signs: Blood pressure 90/59, pulse 49, resp. rate 20, height 5\' 4"  (1.626 m), weight 118.389 kg (261 lb), SpO2 95 %.  PHYSICAL EXAM: General:  Pale, in only mild distress lying down HEENT: normal Lymph: no adenopathy Neck: no JVD Endocrine:  No thryomegaly Vascular: No carotid bruits; trace FA and carotid pulses Cardiac:  normal S1, S2; RRR; no murmur Lungs:  clear to auscultation bilaterally, no wheezing, rhonchi or rales Abd: soft, nontender, no hepatomegaly Ext: no edema Musculoskeletal:  No deformities, BUE and BLE strength normal and equal Skin: warm and dry Neuro:  CNs 2-12 intact, no focal abnormalities noted Psych:  Normal affect   EKG:   Junctional bradycardia with inferior 59mm ST elevation and anterior reciprocal changes.  Labs:   Lab Results  Component Value Date   WBC 17.7* 02/19/2014   HGB 15.6* 02/19/2014   HGB 15.6* 02/19/2014   HCT 46.0 02/19/2014   HCT 46.0 02/19/2014   MCV 88.9 02/19/2014   PLT 267 02/19/2014    Recent Labs Lab 02/19/14 2152 02/19/14 2216  NA 139 141  141  K 3.3* 3.3*  3.3*  CL 104 102  102  CO2 19  --   BUN 20 24*  24*  CREATININE 1.68* 1.60*  1.60*  CALCIUM 8.9  --   GLUCOSE 292* 283*  283*   No results for input(s): CKTOTAL, CKMB, TROPONINI in the last 72 hours. No results found for: CHOL, HDL, LDLCALC, TRIG No results found for: DDIMER  Radiology/Studies:  Dg Chest Portable 1 View  02/19/2014   CLINICAL DATA:  Code ST-elevation MI.  Hypotension.  EXAM: PORTABLE CHEST - 1 VIEW  COMPARISON:  None.  FINDINGS: Lung volumes are low. Mild cardiomegaly. Pulmonary vasculature is normal. There is linear atelectasis or scarring in the left midlung zone. Overlying monitoring device projects over the lower hemithorax.  There is no consolidation, large pleural effusion or pneumothorax. No acute osseous abnormalities.  IMPRESSION: Mild cardiomegaly. Linear atelectasis or scarring left midlung zone.   Electronically Signed   By: Jeb Levering M.D.   On: 02/19/2014 22:22     ASSESSMENT AND PLAN:   1. STEMI - Now s/p primary PCI to proximal LCx (dominant) - Mild LAD and mild-moderate non-dominant RCA disease.  Preserved LVEF with mild inferior HK, elevated LVEDP. - Continue ticagrelor 90 bid, asa81 - Start lipitor 80mg .  HbA1C and lipid panel in the morning. - Monitor BP overnight in setting of RV infarct, start metoprolol in the morning if stable. - Not requiring dopamine post PCI.  LVEDP is elevated but will hold off on lasix until BP is stable for several hours following PCI.   Signed,  Stephani Police, MD 02/20/2014, 12:07 AM

## 2014-02-20 NOTE — Progress Notes (Signed)
CARDIAC REHAB PHASE I   PRE:  Rate/Rhythm: SR 76  BP:  Supine:   Sitting: 108/54  Standing:     SaO2: 97 2lncc  MODE:  Ambulation: 350 ft   POST:  Rate/Rhythm: 112  BP:  Supine:   Sitting: 124/40  Standing:     SaO2: 94 RA  Pt assisted up to ambulate 350 ft x 1 assist.  Pt tolerated well no complaints of cp or sob.  Pt to chair with family at bedside.  In anticipation of possible d/c tomorrow Pt given MI booklet with review.  Heart healthy diet with diabetes modification (noted elevated blood glucose and pending A1C), exercise guidelines with activity restrictions.  Talked with pt regarding outpatient cardiac rehab.  Pt reluctant due to no insurance.  Advised pt that financial assistance is available for pt that qualify. Pt will consider.  Handouts provided, questions answered. Pt in chair with call bell within reach. Cherre Huger, BSN [phone]

## 2014-02-20 NOTE — Progress Notes (Signed)
Patient ID: Hannah Beard, female   DOB: 1955-01-18, 59 y.o.   MRN: [id number]   SUBJECTIVE: No chest pain, nausea or dyspnea this morning.  She is in NSR in the 90s.   Scheduled Meds: . antiseptic oral rinse  7 mL Mouth Rinse BID  . aspirin  81 mg Oral Daily  . atorvastatin  80 mg Oral q1800  . furosemide  40 mg Intravenous Once  . heparin  5,000 Units Subcutaneous 3 times per day  . [START ON 02/21/2014] Influenza vac split quadrivalent PF  0.5 mL Intramuscular Tomorrow-1000  . insulin aspart  0-15 Units Subcutaneous TID WC  . insulin aspart  0-5 Units Subcutaneous QHS  . [START ON 02/21/2014] lisinopril  2.5 mg Oral Daily  . metoprolol tartrate  12.5 mg Oral BID  . potassium chloride  20 mEq Oral Once  . ticagrelor  90 mg Oral BID   Continuous Infusions:  PRN Meds:.acetaminophen, morphine injection, nitroGLYCERIN, oxyCODONE-acetaminophen    Filed Vitals:   02/20/14 0500 02/20/14 0700 02/20/14 0800 02/20/14 0900  BP: 105/63 103/59 93/52 96/56   Pulse: 81 72 74 76  Temp:   98.4 F (36.9 C)   TempSrc:   Oral   Resp: 20 20 15 19   Height:      Weight:      SpO2: 96% 97% 94% 98%    Intake/Output Summary (Last 24 hours) at 02/20/14 0946 Last data filed at 02/20/14 0400  Gross per 24 hour  Intake 404.17 ml  Output    950 ml  Net -545.83 ml    LABS: Basic Metabolic Panel:  Recent Labs  02/19/14 2152 02/19/14 2216 02/20/14 0313  NA 139 141  141 139  K 3.3* 3.3*  3.3* 3.9  CL 104 102  102 107  CO2 19  --  18*  GLUCOSE 292* 283*  283* 137*  BUN 20 24*  24* 18  CREATININE 1.68* 1.60*  1.60* 1.41*  CALCIUM 8.9  --  8.5   Liver Function Tests: No results for input(s): AST, ALT, ALKPHOS, BILITOT, PROT, ALBUMIN in the last 72 hours. No results for input(s): LIPASE, AMYLASE in the last 72 hours. CBC:  Recent Labs  02/19/14 2152 02/19/14 2216 02/20/14 0313  WBC 17.7*  --  14.2*  NEUTROABS 12.4*  --   --   HGB 13.8 15.6*  15.6* 14.9  HCT 41.0 46.0  46.0  43.2  MCV 88.9  --  88.9  PLT 267  --  225   Cardiac Enzymes: No results for input(s): CKTOTAL, CKMB, CKMBINDEX, TROPONINI in the last 72 hours. BNP: Invalid input(s): POCBNP D-Dimer: No results for input(s): DDIMER in the last 72 hours. Hemoglobin A1C: No results for input(s): HGBA1C in the last 72 hours. Fasting Lipid Panel:  Recent Labs  02/20/14 0312  CHOL 221*  HDL 42  LDLCALC 148*  TRIG 156*  CHOLHDL 5.3   Thyroid Function Tests: No results for input(s): TSH, T4TOTAL, T3FREE, THYROIDAB in the last 72 hours.  Invalid input(s): FREET3 Anemia Panel: No results for input(s): VITAMINB12, FOLATE, FERRITIN, TIBC, IRON, RETICCTPCT in the last 72 hours.  RADIOLOGY: Portable Chest X-ray 1 View  02/20/2014   CLINICAL DATA:  Subsequent evaluation for acute myocardial infarction  EXAM: PORTABLE CHEST - 1 VIEW  COMPARISON:  02/19/2014  FINDINGS: Heart size mildly enlarged but stable. Mild vascular congestion with no evidence of pulmonary edema. Stable scarring or discoid atelectasis in the lingula. No pleural effusion.  IMPRESSION: Stable mild cardiac enlargement.  Electronically Signed   By: Skipper Cliche M.D.   On: 02/20/2014 07:08   Dg Chest Portable 1 View  02/19/2014   CLINICAL DATA:  Code ST-elevation MI.  Hypotension.  EXAM: PORTABLE CHEST - 1 VIEW  COMPARISON:  None.  FINDINGS: Lung volumes are low. Mild cardiomegaly. Pulmonary vasculature is normal. There is linear atelectasis or scarring in the left midlung zone. Overlying monitoring device projects over the lower hemithorax. There is no consolidation, large pleural effusion or pneumothorax. No acute osseous abnormalities.  IMPRESSION: Mild cardiomegaly. Linear atelectasis or scarring left midlung zone.   Electronically Signed   By: Jeb Levering M.D.   On: 02/19/2014 22:22    PHYSICAL EXAM General: NAD, obese Neck: JVP 8-9 cm, no thyromegaly or thyroid nodule.  Lungs: Clear to auscultation bilaterally with normal  respiratory effort. CV: Nondisplaced PMI.  Heart regular S1/S2, no S3/S4, no murmur.  No peripheral edema.   Abdomen: Soft, nontender, no hepatosplenomegaly, no distention.  Neurologic: Alert and oriented x 3.  Psych: Normal affect. Extremities: No clubbing or cyanosis.   TELEMETRY: Reviewed telemetry pt in NSR in 90s  ASSESSMENT AND PLAN: 59 yo presented with inferior STEMI, initially with junctional bradycardia. There was also concern for RV infarction based on initial hypotension.  1. CAD: Inferior STEMI, now s/p DES to dominant LCx.  Symptoms resolved.  - Continue Brilinta, ASA 81, and statin.  - Can add low dose lisinopril in am (will wait until tomorrow with soft BP).  2. Bradycardia: Initially junctional bradycardia in setting of inferior MI.  This has resolved s/p PCI.  HR in 90s, low dose metoprolol started.  3. CHF: Acute diastolic CHF.  Elevated LVEDP at cath.  She has some volume overload on exam.  She got Lasix 80 mg IV in the cath lab.  Will give her another 40 mg IV later this afternoon.  Echo to be done today.  EF 65% on LV-gram, will look specifically for evidence of RV infarction.  4. Elevated creatinine: ?hemodynamic versus CKD.  Follow with diuresis.  5. ?Diabetes: Blood glucose high.  Will get hemoglobin A1c and put in for sliding scale insulin.  Loralie Champagne 02/20/2014 9:52 AM

## 2014-02-21 ENCOUNTER — Encounter (HOSPITAL_COMMUNITY): Payer: Self-pay | Admitting: Physician Assistant

## 2014-02-21 DIAGNOSIS — E785 Hyperlipidemia, unspecified: Secondary | ICD-10-CM

## 2014-02-21 DIAGNOSIS — I25118 Atherosclerotic heart disease of native coronary artery with other forms of angina pectoris: Secondary | ICD-10-CM

## 2014-02-21 DIAGNOSIS — I2119 ST elevation (STEMI) myocardial infarction involving other coronary artery of inferior wall: Principal | ICD-10-CM

## 2014-02-21 DIAGNOSIS — I5031 Acute diastolic (congestive) heart failure: Secondary | ICD-10-CM

## 2014-02-21 DIAGNOSIS — R001 Bradycardia, unspecified: Secondary | ICD-10-CM

## 2014-02-21 DIAGNOSIS — I2510 Atherosclerotic heart disease of native coronary artery without angina pectoris: Secondary | ICD-10-CM | POA: Diagnosis not present

## 2014-02-21 DIAGNOSIS — N179 Acute kidney failure, unspecified: Secondary | ICD-10-CM

## 2014-02-21 LAB — BASIC METABOLIC PANEL
Anion gap: 9 (ref 5–15)
BUN: 23 mg/dL (ref 6–23)
CO2: 23 mmol/L (ref 19–32)
Calcium: 8.8 mg/dL (ref 8.4–10.5)
Chloride: 107 mmol/L (ref 96–112)
Creatinine, Ser: 1.29 mg/dL — ABNORMAL HIGH (ref 0.50–1.10)
GFR calc Af Amer: 52 mL/min — ABNORMAL LOW (ref >90)
GFR calc non Af Amer: 45 mL/min — ABNORMAL LOW (ref >90)
Glucose, Bld: 136 mg/dL — ABNORMAL HIGH (ref 70–99)
Potassium: 3.6 mmol/L (ref 3.5–5.1)
Sodium: 139 mmol/L (ref 135–145)

## 2014-02-21 LAB — CBC
HCT: 38.4 % (ref 36.0–46.0)
Hemoglobin: 13 g/dL (ref 12.0–15.0)
MCH: 30.4 pg (ref 26.0–34.0)
MCHC: 33.9 g/dL (ref 30.0–36.0)
MCV: 89.7 fL (ref 78.0–100.0)
Platelets: 230 10*3/uL (ref 150–400)
RBC: 4.28 MIL/uL (ref 3.87–5.11)
RDW: 13 % (ref 11.5–15.5)
WBC: 10.1 10*3/uL (ref 4.0–10.5)

## 2014-02-21 LAB — GLUCOSE, CAPILLARY
Glucose-Capillary: 119 mg/dL — ABNORMAL HIGH (ref 70–99)
Glucose-Capillary: 94 mg/dL (ref 70–99)
Glucose-Capillary: 120 mg/dL — ABNORMAL HIGH (ref 70–99)

## 2014-02-21 NOTE — Progress Notes (Signed)
Patient Name: Hannah Beard Date of Encounter: 02/21/2014     Active Problems:   Acute MI, inferolateral wall, initial episode of care   Acute inferolateral myocardial infarction    SUBJECTIVE  Breathing is back to baseline. No CP, SOB, orthopnea, PND or LE edema.   CURRENT MEDS . antiseptic oral rinse  7 mL Mouth Rinse BID  . aspirin  81 mg Oral Daily  . atorvastatin  80 mg Oral q1800  . furosemide  40 mg Intravenous Once  . heparin  5,000 Units Subcutaneous 3 times per day  . Influenza vac split quadrivalent PF  0.5 mL Intramuscular Tomorrow-1000  . insulin aspart  0-15 Units Subcutaneous TID WC  . insulin aspart  0-5 Units Subcutaneous QHS  . lisinopril  2.5 mg Oral Daily  . metoprolol tartrate  12.5 mg Oral BID  . ticagrelor  90 mg Oral BID    OBJECTIVE  Filed Vitals:   02/20/14 1927 02/20/14 2227 02/21/14 0332 02/21/14 0500  BP:  122/64  125/71  Pulse:  79  88  Temp: 98.1 F (36.7 C) 98.4 F (36.9 C)  98.6 F (37 C)  TempSrc: Oral Oral  Oral  Resp:  18  20  Height:      Weight:   255 lb 8.2 oz (115.9 kg)   SpO2:  92%  91%    Intake/Output Summary (Last 24 hours) at 02/21/14 0920 Last data filed at 02/21/14 0826  Gross per 24 hour  Intake    720 ml  Output    650 ml  Net     70 ml   Filed Weights   02/19/14 2149 02/20/14 0015 02/21/14 0332  Weight: 261 lb (118.389 kg) 266 lb 12.1 oz (121 kg) 255 lb 8.2 oz (115.9 kg)    PHYSICAL EXAM  General: Pleasant, NAD. Obese  Neuro: Alert and oriented X 3. Moves all extremities spontaneously. Psych: Normal affect. HEENT:  Normal  Neck: Supple without bruits or JVD. Lungs:  Resp regular and unlabored, CTA. Heart: RRR no s3, s4, or murmurs. Abdomen: Soft, non-tender, non-distended, BS + x 4.  Extremities: No clubbing, cyanosis or edema. DP/PT/Radials 2+ and equal bilaterally.  Accessory Clinical Findings  CBC  Recent Labs  02/19/14 2152  02/20/14 0313 02/21/14 0450  WBC 17.7*  --  14.2* 10.1    NEUTROABS 12.4*  --   --   --   HGB 13.8  < > 14.9 13.0  HCT 41.0  < > 43.2 38.4  MCV 88.9  --  88.9 89.7  PLT 267  --  225 230  < > = values in this interval not displayed. Basic Metabolic Panel  Recent Labs  02/20/14 0313 02/21/14 0450  NA 139 139  K 3.9 3.6  CL 107 107  CO2 18* 23  GLUCOSE 137* 136*  BUN 18 23  CREATININE 1.41* 1.29*  CALCIUM 8.5 8.8    Fasting Lipid Panel  Recent Labs  02/20/14 0312  CHOL 221*  HDL 42  LDLCALC 148*  TRIG 156*  CHOLHDL 5.3    TELE  NSR HR in 90s with some PVCs  Radiology/Studies  Portable Chest X-ray 1 View  02/20/2014   CLINICAL DATA:  Subsequent evaluation for acute myocardial infarction  EXAM: PORTABLE CHEST - 1 VIEW  COMPARISON:  02/19/2014  FINDINGS: Heart size mildly enlarged but stable. Mild vascular congestion with no evidence of pulmonary edema. Stable scarring or discoid atelectasis in the lingula. No pleural effusion.  IMPRESSION:  Stable mild cardiac enlargement.   Electronically Signed   By: Skipper Cliche M.D.   On: 02/20/2014 07:08   Dg Chest Portable 1 View  02/19/2014   CLINICAL DATA:  Code ST-elevation MI.  Hypotension.  EXAM: PORTABLE CHEST - 1 VIEW  COMPARISON:  None.  FINDINGS: Lung volumes are low. Mild cardiomegaly. Pulmonary vasculature is normal. There is linear atelectasis or scarring in the left midlung zone. Overlying monitoring device projects over the lower hemithorax. There is no consolidation, large pleural effusion or pneumothorax. No acute osseous abnormalities.  IMPRESSION: Mild cardiomegaly. Linear atelectasis or scarring left midlung zone.   Electronically Signed   By: Jeb Levering M.D.   On: 02/19/2014 22:22    ASSESSMENT AND PLAN   Hannah Beard is a 59 y.o. female with a history of obesity and former tobacco abuse who presented to [facility] on 02/19/14 with inferior STEMI, initially with junctional bradycardia. There was also concern for RV infarction based on initial hypotension.   CAD:  Inferior STEMI on , now s/p DES to dominant LCx. Symptoms resolved.  -- Continue Brilinta, ASA 81, and statin.  -- Low dose lisinopril added this morning.   Bradycardia: Initially junctional bradycardia in setting of inferior MI. This has resolved s/p PCI. HR in 90s, low dose metoprolol started.   CHF: Acute diastolic CHF. Elevated LVEDP at cath. She has some volume overload on exam. She received 80mg  IV lasix in cath lab followed by an additional 40mg  IV Lasix. Net neg 1.3 L.  -- EF 65% on LV-gram. 2D ECHO 02/20/14 with EF 55-60%, mild LVH, basal to mid inferior and inferolateral severe hypokinesis. G2DD. Normal RV size and systolic function.    Elevated creatinine: ?hemodynamic versus CKD. Trending downward 1.41--> 1.29  ?Diabetes: Blood glucose high. Hemoglobin A1c in process  -- Continue SSI   HLD- LDL 148. Continue high dose statin   Judy Pimple PA-C  Pager [phone]  I have personally seen and examined the patient and agree with Angelena Form, PA.  Patient doing well.  She has been working with Cardiac Rehab.  Await HgA1C.  She does not appear volume overloaded so will stop Lasix.  Creatinine stable. Adding low dose lisinopril this am since BP is stable. Check BMET in am.   Rhythm stable on BB. Plan to ambulate today and discharge home in am.  Signed Fransico Him, MD [facility] 01/21/2014

## 2014-02-21 NOTE — Progress Notes (Signed)
Utilization Review Completed.   Heywood Tokunaga, RN, BSN Nurse Case Manager  

## 2014-02-22 ENCOUNTER — Telehealth: Payer: Self-pay | Admitting: Cardiovascular Disease

## 2014-02-22 ENCOUNTER — Encounter (HOSPITAL_COMMUNITY): Payer: Self-pay | Admitting: Physician Assistant

## 2014-02-22 DIAGNOSIS — Z955 Presence of coronary angioplasty implant and graft: Secondary | ICD-10-CM

## 2014-02-22 DIAGNOSIS — N189 Chronic kidney disease, unspecified: Secondary | ICD-10-CM | POA: Diagnosis present

## 2014-02-22 DIAGNOSIS — I2119 ST elevation (STEMI) myocardial infarction involving other coronary artery of inferior wall: Secondary | ICD-10-CM

## 2014-02-22 DIAGNOSIS — E785 Hyperlipidemia, unspecified: Secondary | ICD-10-CM | POA: Diagnosis present

## 2014-02-22 DIAGNOSIS — I2510 Atherosclerotic heart disease of native coronary artery without angina pectoris: Secondary | ICD-10-CM | POA: Diagnosis present

## 2014-02-22 DIAGNOSIS — Z87891 Personal history of nicotine dependence: Secondary | ICD-10-CM

## 2014-02-22 LAB — BASIC METABOLIC PANEL
Anion gap: 7 (ref 5–15)
BUN: 21 mg/dL (ref 6–23)
CO2: 28 mmol/L (ref 19–32)
Calcium: 9.3 mg/dL (ref 8.4–10.5)
Chloride: 108 mmol/L (ref 96–112)
Creatinine, Ser: 1.29 mg/dL — ABNORMAL HIGH (ref 0.50–1.10)
GFR calc Af Amer: 52 mL/min — ABNORMAL LOW (ref >90)
GFR calc non Af Amer: 45 mL/min — ABNORMAL LOW (ref >90)
Glucose, Bld: 171 mg/dL — ABNORMAL HIGH (ref 70–99)
Potassium: 3.5 mmol/L (ref 3.5–5.1)
Sodium: 143 mmol/L (ref 135–145)

## 2014-02-22 LAB — HEMOGLOBIN A1C
Hgb A1c MFr Bld: 6.1 % — ABNORMAL HIGH (ref 4.8–5.6)
Mean Plasma Glucose: 128 mg/dL

## 2014-02-22 LAB — GLUCOSE, CAPILLARY: Glucose-Capillary: 155 mg/dL — ABNORMAL HIGH (ref 70–99)

## 2014-02-22 LAB — POCT ACTIVATED CLOTTING TIME: Activated Clotting Time: 773 seconds

## 2014-02-22 MED ORDER — ASPIRIN 81 MG PO CHEW: 81.0000 mg | CHEWABLE_TABLET | Freq: Every day | ORAL | Status: DC

## 2014-02-22 MED ORDER — METOPROLOL TARTRATE 25 MG PO TABS: 12.5000 mg | ORAL_TABLET | Freq: Two times a day (BID) | ORAL | Status: DC

## 2014-02-22 MED ORDER — LISINOPRIL 2.5 MG PO TABS: 2.5000 mg | ORAL_TABLET | Freq: Every day | ORAL | Status: DC

## 2014-02-22 MED ORDER — NITROGLYCERIN 0.4 MG SL SUBL: 0.4000 mg | SUBLINGUAL_TABLET | SUBLINGUAL | Status: DC | PRN

## 2014-02-22 MED ORDER — TICAGRELOR 90 MG PO TABS: 90.0000 mg | ORAL_TABLET | Freq: Two times a day (BID) | ORAL | Status: DC

## 2014-02-22 MED ORDER — ATORVASTATIN CALCIUM 80 MG PO TABS: 80.0000 mg | ORAL_TABLET | Freq: Every day | ORAL | Status: DC

## 2014-02-22 MED FILL — Sodium Chloride IV Soln 0.9%: INTRAVENOUS | Qty: 50 | Status: AC

## 2014-02-22 NOTE — Progress Notes (Signed)
Pt/family given discharge instructions, medication lists, follow up appointments, and when to call the doctor.  Pt/family verbalizes understanding. Pt given signs and symptoms of infection. Donoven Pett McClintock, RN    

## 2014-02-22 NOTE — Discharge Instructions (Signed)

## 2014-02-22 NOTE — Progress Notes (Signed)
CARDIAC REHAB PHASE I   PRE:  Rate/Rhythm: 98 SR  BP:  Supine:   Sitting: 139/70  Standing:    SaO2:   MODE:  Ambulation: 550 ft   POST:  Rate/Rhythm: 128 ST  BP:  Supine:   Sitting: 148/67  Standing:    SaO2:  0835-0935 Pt walked 550 ft with steady gait, no CP. Does have back issues that limit long distance walking. MI education done again as pt stated material was not reviewed with her but she was to read. Did not have materials from Saturday as pt stated they did not come over with her. Gave pt MI booklet, heart healthy and diabetic diets (HGA1C pending), reviewed NTG use, stent/brilinta, MI restrictions and carb counting and heart healthy food choices. Reviewed lipid panel. Pt to receive brilinta booklet before discharge. Pt's RN aware. Discussed CRP 2 and pt gave permission to refer to [facility] Phase 2. Pt voiced understanding of ed.   Graylon Good, RN BSN  02/22/2014 9:29 AM

## 2014-02-22 NOTE — Progress Notes (Signed)
Patient Name: Hannah Beard Date of Encounter: 02/22/2014     Active Problems:   Acute MI, inferolateral wall, initial episode of care   Acute inferolateral myocardial infarction   Bradycardia   Acute kidney injury   Acute diastolic CHF (congestive heart failure)   CAD (coronary artery disease), native coronary artery   Dyslipidemia    SUBJECTIVE  Breathing is back to baseline. No CP, SOB, orthopnea, PND or LE edema. Ready to go home  CURRENT MEDS . antiseptic oral rinse  7 mL Mouth Rinse BID  . aspirin  81 mg Oral Daily  . atorvastatin  80 mg Oral q1800  . furosemide  40 mg Intravenous Once  . heparin  5,000 Units Subcutaneous 3 times per day  . Influenza vac split quadrivalent PF  0.5 mL Intramuscular Tomorrow-1000  . insulin aspart  0-15 Units Subcutaneous TID WC  . insulin aspart  0-5 Units Subcutaneous QHS  . lisinopril  2.5 mg Oral Daily  . metoprolol tartrate  12.5 mg Oral BID  . ticagrelor  90 mg Oral BID    OBJECTIVE  Filed Vitals:   02/21/14 0500 02/21/14 1500 02/21/14 2100 02/22/14 0517  BP: 125/71 108/35 113/50 126/70  Pulse: 88 93 94 87  Temp: 98.6 F (37 C) 98.6 F (37 C) 99 F (37.2 C) 98.2 F (36.8 C)  TempSrc: Oral Oral Oral Oral  Resp: 20 20  18   Height:      Weight:    256 lb 6.4 oz (116.302 kg)  SpO2: 91% 92% 92% 94%    Intake/Output Summary (Last 24 hours) at 02/22/14 1016 Last data filed at 02/21/14 1145  Gross per 24 hour  Intake    240 ml  Output      0 ml  Net    240 ml   Filed Weights   02/20/14 0015 02/21/14 0332 02/22/14 0517  Weight: 266 lb 12.1 oz (121 kg) 255 lb 8.2 oz (115.9 kg) 256 lb 6.4 oz (116.302 kg)    PHYSICAL EXAM  General: Pleasant, NAD. Obese  Neuro: Alert and oriented X 3. Moves all extremities spontaneously. Psych: Normal affect. HEENT:  Normal  Neck: Supple without bruits or JVD. Lungs:  Resp regular and unlabored, CTA. Heart: RRR no s3, s4, or murmurs. Abdomen: Soft, non-tender, non-distended, BS  + x 4.  Extremities: No clubbing, cyanosis or edema. DP/PT/Radials 2+ and equal bilaterally.  Accessory Clinical Findings  CBC  Recent Labs  02/19/14 2152  02/20/14 0313 02/21/14 0450  WBC 17.7*  --  14.2* 10.1  NEUTROABS 12.4*  --   --   --   HGB 13.8  < > 14.9 13.0  HCT 41.0  < > 43.2 38.4  MCV 88.9  --  88.9 89.7  PLT 267  --  225 230  < > = values in this interval not displayed. Basic Metabolic Panel  Recent Labs  02/20/14 0313 02/21/14 0450  NA 139 139  K 3.9 3.6  CL 107 107  CO2 18* 23  GLUCOSE 137* 136*  BUN 18 23  CREATININE 1.41* 1.29*  CALCIUM 8.5 8.8    Fasting Lipid Panel  Recent Labs  02/20/14 0312  CHOL 221*  HDL 42  LDLCALC 148*  TRIG 156*  CHOLHDL 5.3    TELE  NSR HR in 90s with some PVCs  Radiology/Studies  Portable Chest X-ray 1 View  02/20/2014   CLINICAL DATA:  Subsequent evaluation for acute myocardial infarction  EXAM: PORTABLE CHEST -  1 VIEW  COMPARISON:  02/19/2014  FINDINGS: Heart size mildly enlarged but stable. Mild vascular congestion with no evidence of pulmonary edema. Stable scarring or discoid atelectasis in the lingula. No pleural effusion.  IMPRESSION: Stable mild cardiac enlargement.   Electronically Signed   By: Skipper Cliche M.D.   On: 02/20/2014 07:08   Dg Chest Portable 1 View  02/19/2014   CLINICAL DATA:  Code ST-elevation MI.  Hypotension.  EXAM: PORTABLE CHEST - 1 VIEW  COMPARISON:  None.  FINDINGS: Lung volumes are low. Mild cardiomegaly. Pulmonary vasculature is normal. There is linear atelectasis or scarring in the left midlung zone. Overlying monitoring device projects over the lower hemithorax. There is no consolidation, large pleural effusion or pneumothorax. No acute osseous abnormalities.  IMPRESSION: Mild cardiomegaly. Linear atelectasis or scarring left midlung zone.   Electronically Signed   By: Jeb Levering M.D.   On: 02/19/2014 22:22    ASSESSMENT AND PLAN   Hannah Beard is a 59 y.o. female  with a history of obesity and former tobacco abuse who presented to [facility] on 02/19/14 with inferior STEMI, initially with junctional bradycardia. There was also concern for RV infarction based on initial hypotension.   CAD: Inferior STEMI on , now s/p DES to dominant LCx. Symptoms resolved.  -- Continue Brilinta/ ASA 81, metoprolol 12.5mg  BID and statin.  -- Low dose lisinopril 2.5mg  added yesterday morning. BMET pending today.   Bradycardia: Initially junctional bradycardia in setting of inferior MI. This has resolved s/p PCI. HR in 90s, low dose metoprolol started.   CHF: Acute diastolic CHF. Elevated LVEDP at cath.She received 80mg  IV lasix in cath lab followed by an additional 40mg  IV Lasix. Net neg 1 L. Now appears evolemic.  -- EF 65% on LV-gram. 2D ECHO 02/20/14 with EF 55-60%, mild LVH, basal to mid inferior and inferolateral severe hypokinesis. G2DD. Normal RV size and systolic function.   -- She does not appear volume overloaded Lasix discontinued  Elevated creatinine: ?hemodynamic versus CKD. Trending downward 1.41--> 1.29  -- BMET pending today  ?Diabetes: Blood glucose high. Hemoglobin A1c in process. Will not wait for this to discharge patient.  -- Continue SSI   HLD- LDL 148. Continue high dose statin    Signed, Eileen Stanford PA-C  Pager [phone]  I have seen and examined the patient along with Angelena Form R PA-C.  I have reviewed the chart, notes and new data.  I agree with PA/NP's note.  Key new complaints: no angina or dyspnea during brisk walking Key examination changes: no clinical signs of CHF Key new findings / data: labs pending  PLAN: DC home today. Continue ACEi unless there is a new worsening of creatinine. Start carvedilol as outpatient. Statin, ASA/ Brilinta for minimum 12 months. Discussed diet, lifestyle changes.  Sanda Klein, MD, [place] [phone] 02/22/2014, 10:29 AM

## 2014-02-22 NOTE — Discharge Summary (Signed)
Discharge Summary   Patient ID: Hannah Beard MRN: [id number], DOB/AGE: [birth year] 59 y.o. Admit date: 02/19/2014 D/C date:     02/22/2014  Primary Cardiologist: Dr. Burt Knack (new)  Principal Problem:   ST elevation myocardial infarction (STEMI) of inferior wall Active Problems:   Bradycardia   Acute kidney injury   Acute diastolic CHF (congestive heart failure)   CAD (coronary artery disease), native coronary artery   Former smoker   CKD (chronic kidney disease)   HLD (hyperlipidemia)   CAD (coronary artery disease)    Admission Dates: 02/19/14-02/21/11 Discharge Diagnosis: Acute inferior STEMI s/p DES to LCx  HPI: Hannah Beard is a 59 y.o. female with a history of obesity, former tobacco abuse and no prior cardiac history who presented to [facility] on 02/19/14 with inferior STEMI, initially with junctional bradycardia. There was also concern for RV infarction based on initial hypotension. She was called as a CODE STEMI and taken back for emergent cardiac catheterization.    On the night of admission, just after dinner- she developed lightheadedness then syncope with nausea/vomiting and shortness of breath. No chest pain. EMS was called and noted inferior ST elevation on ECG. She was also pale and hypotensive with SBP of 60. She was given 324mg  of ASA but no NTG.In the ED she remained hypotensive and the concern was for RV infarct. 16mm ST elevation inferiorly on ECG with junctional bradycardia. IV fluids were started as well as dopamine at 5 and SBP improved to 90. She was given zofran and heparin in ED as well, and was transported up to the cath lab for primary PCI.   Hospital Course  CAD: Inferior STEMI on , now s/p DES to dominant LCx. Symptoms resolved.  -- Continue Brilinta/ ASA 81, metoprolol 12.5mg  BID and statin.  -- Low dose lisinopril 2.5mg  added yesterday morning. Creat stable today at 1.29  Bradycardia: Initially junctional bradycardia in setting of inferior MI.  This has resolved s/p PCI. HR in 90s, low dose metoprolol started.   CHF: Acute diastolic CHF. Elevated LVEDP at cath.She received 80mg  IV lasix in cath lab followed by an additional 40mg  IV Lasix. Net neg 1 L. Now appears evolemic.  -- EF 65% on LV-gram. 2D ECHO 02/20/14 with EF 55-60%, mild LVH, basal to mid inferior and inferolateral severe hypokinesis. G2DD. Normal RV size and systolic function.  -- She does not appear volume overloaded Lasix discontinued  Elevated creatinine: ?hemodynamic versus CKD. Trending downward 1.41--> 1.29  -- Creat stable today at 1.29 -- She has been counseled on avoiding NSAIDS  ?Diabetes: Blood glucose high. Hemoglobin A1c in process. Will not wait for this to discharge patient.  -- Continue SSI  -- Will follow up with new PCP, Dr. Rex Kras.   HLD- LDL 148. Continue high dose statin   The patient has had an uncomplicated hospital course and is recovering well. The radial catheter site is stable. She has been seen by Dr. Sallyanne Kuster today and deemed ready for discharge home. All follow-up appointments have been scheduled.  A written RX for a 30 day free supply of Brilinta was provided for the patient. Discharge medications are listed below. Discharge summary will be forwarded to her new PCP, Dr. Tamsen Roers in [place].   Discharge Vitals: Blood pressure 126/70, pulse 87, temperature 98.2 F (36.8 C), temperature source Oral, resp. rate 18, height 5' 4.5" (1.638 m), weight 256 lb 6.4 oz (116.302 kg), SpO2 94 %.  Labs: Lab Results  Component Value Date  WBC 10.1 02/21/2014   HGB 13.0 02/21/2014   HCT 38.4 02/21/2014   MCV 89.7 02/21/2014   PLT 230 02/21/2014     Recent Labs Lab 02/22/14 0930  NA 143  K 3.5  CL 108  CO2 28  BUN 21  CREATININE 1.29*  CALCIUM 9.3  GLUCOSE 171*    Lab Results  Component Value Date   CHOL 221* 02/20/2014   HDL 42 02/20/2014   LDLCALC 148* 02/20/2014   TRIG 156* 02/20/2014     Diagnostic  Studies/Procedures   Portable Chest X-ray 1 View  02/20/2014   CLINICAL DATA:  Subsequent evaluation for acute myocardial infarction  EXAM: PORTABLE CHEST - 1 VIEW  COMPARISON:  02/19/2014  FINDINGS: Heart size mildly enlarged but stable. Mild vascular congestion with no evidence of pulmonary edema. Stable scarring or discoid atelectasis in the lingula. No pleural effusion.  IMPRESSION: Stable mild cardiac enlargement.   Electronically Signed   By: Skipper Cliche M.D.   On: 02/20/2014 07:08   Dg Chest Portable 1 View  02/19/2014   CLINICAL DATA:  Code ST-elevation MI.  Hypotension.  EXAM: PORTABLE CHEST - 1 VIEW  COMPARISON:  None.  FINDINGS: Lung volumes are low. Mild cardiomegaly. Pulmonary vasculature is normal. There is linear atelectasis or scarring in the left midlung zone. Overlying monitoring device projects over the lower hemithorax. There is no consolidation, large pleural effusion or pneumothorax. No acute osseous abnormalities.  IMPRESSION: Mild cardiomegaly. Linear atelectasis or scarring left midlung zone.   Electronically Signed   By: Jeb Levering M.D.   On: 02/19/2014 22:22    2D ECHO: 02/20/2014 LV EF: 55% -   60% Study Conclusions - Left ventricle: The cavity size was normal. Wall thickness was   increased in a pattern of mild LVH. Systolic function was normal.   The estimated ejection fraction was in the range of 55% to 60%.   Basal to mid inferior and inferolateral severe hypokinesis.   Features are consistent with a pseudonormal left ventricular   filling pattern, with concomitant abnormal relaxation and   increased filling pressure (grade 2 diastolic dysfunction). - Aortic valve: There was no stenosis. - Mitral valve: Mildly calcified annulus. There was no significant   regurgitation. - Right ventricle: The cavity size was normal. Systolic function   was normal. - Pulmonary arteries: No complete TR doppler jet so unable to   estimate PA systolic pressure. - Inferior  vena cava: The vessel was normal in size. The   respirophasic diameter changes were in the normal range (>= 50%),   consistent with normal central venous pressure. - Pericardium, extracardiac: A trivial pericardial effusion was   identified. Impressions: - Normal LV size with mild LV hypertrophy. EF 55%. There was basal   to mid inferior and inferolateral severe hypokinesis. Moderate   diastolic dysfunction. Normal RV size and systolic function.      02/19/14 Cardiac Catheterization Procedure Note  PROCEDURAL FINDINGS Hemodynamics: AO 98/66 LV 98/31  Left ventriculography: There is mild to moderate hypokinesis of the mid inferior wall. The other LV wall segments contract vigorously. The estimated LVEF is 65%.   PCI Data: Vessel - LCx/Segment - proximal Percent Stenosis (pre) 100 TIMI-flow 0 Stent 3.5x16 mm Promus DES Percent Stenosis (post) 0 TIMI-flow (post) 3  Final Conclusions:  1. Acute inferoposterior/lateral MI secondary to occlusion of a dominant left circumflex, treated successfully with primary PCI using a drug-eluting stent 2. Minimal nonobstructive disease of the LAD and mild to moderate  nonobstructive stenosis of a nondominant RCA 3. Mild segmental contraction abnormality the left ventricle with preserved overall LVEF 4. Markedly elevated left ventricular end-diastolic pressure  Recommendations:  Post MI medical therapy with aggressive secondary risk reduction. May require IV diuresis based on high LV filling pressures. Monitor closely and CCU. The patient was loaded with ticagrelor 180 mg on the cath table. She should receive IV cangrelor x 2 hours. Bivalirudin was discontinued post-PCI in the cath lab.     Discharge Medications     Medication List    STOP taking these medications        ibuprofen 200 MG tablet  Commonly known as:  ADVIL,MOTRIN      TAKE these medications        aspirin 81 MG chewable tablet  Chew 1 tablet (81 mg total) by mouth  daily.     atorvastatin 80 MG tablet  Commonly known as:  LIPITOR  Take 1 tablet (80 mg total) by mouth daily at 6 PM.     lisinopril 2.5 MG tablet  Commonly known as:  PRINIVIL,ZESTRIL  Take 1 tablet (2.5 mg total) by mouth daily.     metoprolol tartrate 25 MG tablet  Commonly known as:  LOPRESSOR  Take 0.5 tablets (12.5 mg total) by mouth 2 (two) times daily.     nitroGLYCERIN 0.4 MG SL tablet  Commonly known as:  NITROSTAT  Place 1 tablet (0.4 mg total) under the tongue every 5 (five) minutes x 3 doses as needed for chest pain.     ticagrelor 90 MG Tabs tablet  Commonly known as:  BRILINTA  Take 1 tablet (90 mg total) by mouth 2 (two) times daily.        Disposition   The patient will be discharged in stable condition to home. Discharge Instructions    Amb Referral to Cardiac Rehabilitation    Complete by:  As directed   Referring to [place] Phase 2          Follow-up Information    Follow up with Tamsen Roers, MD.   Specialty:  Family Medicine   Why:  Please follow up with Dr .Rex Kras as your new PCP. We will send him your discharge summary    Contact information:   [address] [place] [address] [phone]       Follow up with Richardson Dopp, PA-C On 03/08/2014.   Specialty:  Physician Assistant   Why:  @ 8:30am   Contact information:   [address] [address] [phone]         Duration of Discharge Encounter: Greater than 30 minutes including physician and PA time.  Mable Fill R PA-C 02/22/2014, 11:47 AM

## 2014-02-22 NOTE — Telephone Encounter (Signed)
Returned call, phone busy at this time.

## 2014-02-22 NOTE — Telephone Encounter (Signed)
I received prior authorization for Brilinta 90mg  bid until 02/23/2015--PA # [id number]. I attempted to contact [facility] in [place], phone did not ring. Pt advised Brilinta approved until 02/23/2015.

## 2014-02-22 NOTE — Telephone Encounter (Signed)
Pt states she was told by [facility] in [place] that they would not honor 30 day free and that Brilinta needed prior approval by insurance before they would fill the prescription.   Pt gave me phone number [phone], I was transferred to [phone],spoke with Cristie Hem. I was told by Cristie Hem that they did not handle oral medication prior authorization and he would transfer me to the appropriate department but could not give me the telephone number. I was transferred to [facility], she said her phone number was [phone], and she could not help me. She instructed me to calll [facility],[phone].  I called [phone] and spoke with Rodman Key.

## 2014-02-22 NOTE — Telephone Encounter (Signed)
New Message         Pt scheduled for TCM appt on 03/08/14 at 8:30 w/ Richardson Dopp per Myra (PA).

## 2014-02-22 NOTE — Telephone Encounter (Signed)
New Message  Pt daughter - calling about prescription (brilinta- 90 mg). Please call back (ask for [facility]) and discuss.

## 2014-02-23 LAB — GLUCOSE, CAPILLARY: Glucose-Capillary: 92 mg/dL (ref 70–99)

## 2014-02-23 NOTE — Telephone Encounter (Signed)
**Note De-Identified Hannah Beard Obfuscation** Patient contacted regarding discharge from [facility] hospital on 02/22/14.  Patient understands to follow up with Richardson Dopp, PA-C  on 03/08/14 at 8:30 at [facility] located at [address]. Patient understands discharge instructions? yes  Patient understands medications and regiment? yes Patient understands to bring all medications to this visit? yes

## 2014-03-03 ENCOUNTER — Ambulatory Visit (INDEPENDENT_AMBULATORY_CARE_PROVIDER_SITE_OTHER): Payer: 59 | Admitting: Sports Medicine

## 2014-03-03 VITALS — BP 132/76 | HR 77 | Temp 98.4°F | Resp 16 | Ht 64.25 in | Wt 252.0 lb

## 2014-03-03 DIAGNOSIS — N3001 Acute cystitis with hematuria: Secondary | ICD-10-CM

## 2014-03-03 DIAGNOSIS — R300 Dysuria: Secondary | ICD-10-CM

## 2014-03-03 DIAGNOSIS — T457X5A Adverse effect of anticoagulant antagonists, vitamin K and other coagulants, initial encounter: Secondary | ICD-10-CM

## 2014-03-03 DIAGNOSIS — R3915 Urgency of urination: Secondary | ICD-10-CM

## 2014-03-03 LAB — POCT UA - MICROSCOPIC ONLY
Casts, Ur, LPF, POC: NEGATIVE
Crystals, Ur, HPF, POC: NEGATIVE
Yeast, UA: NEGATIVE

## 2014-03-03 LAB — POCT URINALYSIS DIPSTICK
Glucose, UA: NEGATIVE
Ketones, UA: 15
Nitrite, UA: NEGATIVE
Protein, UA: >=300
Spec Grav, UA: 1.01
Urobilinogen, UA: 2
pH, UA: 5.5

## 2014-03-03 MED ORDER — CIPROFLOXACIN HCL 500 MG PO TABS: 500.0000 mg | ORAL_TABLET | Freq: Two times a day (BID) | ORAL | Status: DC

## 2014-03-03 NOTE — Progress Notes (Signed)
Hannah Beard - 59 y.o. female MRN [id number]  Date of birth: [birth year]  SUBJECTIVE:  Including CC & ROS.  Patient's a 59 year old Caucasian female past medical history is significant for recent  inferior wall STEMI status post 1 stent placed in the left circumflex just discharged on 02/22/14, currently on Brilinta and 81 mg aspirin. History also significant for hypertension hyperlipidemia and congestive heart failure with an EF of 55%. Patient presents to urgent care this evening complaining of hematuria was started today 03/03/14. Patient reports noticing small clots in her urine followed by dark red discoloration to the event. He also complains associated symptoms of urgency, frequency, bladder pressure. Denies any flank pain or radiating pain within the flanks of her abdomen. Patient denies any recent urinary catheterization in the hospital. She had no problems urinating prior to leaving the hospital. No further problems until today. Patient denies any body aches fever or chills.   ROS:  Constitutional:  No fever, chills, or fatigue.  Respiratory:  No shortness of breath, cough, or wheezing Cardiovascular:  No palpitations, chest pain or syncope Gastrointestinal:  No nausea, no abdominal pain Review of systems otherwise negative except for what is stated in HPI  HISTORY: Past Medical, Surgical, Social, and Family History Reviewed & Updated per EMR. Pertinent Historical Findings include: Recent STEMI, CHF, hypertension, tubal ligation, family history of diabetes heart disease and hypertension in her mother, father, brothers grandparents History of tobacco abuse quit in 2011, no alcohol use or illegal drug use.  PHYSICAL EXAM:  VS: BP:132/76 mmHg  HR:77bpm  TEMP:98.4 F (36.9 C)(Oral)  RESP:95 %  HT:5' 4.25" (163.2 cm)   WT:252 lb (114.306 kg)  BMI:43 PHYSICAL EXAM: General:  Alert and oriented, No acute distress.   HENT:  Normocephalic, Oral mucosa is moist.   Respiratory:  Lungs are  clear to auscultation, Respirations are non-labored, Symmetrical chest wall expansion.   Cardiovascular:  Normal rate, Regular rhythm, No murmur, Good pulses equal in all extremities, No edema.   Gastrointestinal:  Soft, mild tenderness to palpation over the suprapubic region, no flank pain, no right upper quadrant and left upper quadrant pain., Non-distended, Normal bowel sounds, No organomegaly.   Integumentary:  Warm, Dry, No rash.   Neurologic:  Alert, Oriented, No focal defects Psychiatric:  Cooperative, Appropriate mood & affect.    Results for orders placed or performed in visit on 03/03/14  POCT UA - Microscopic Only  Result Value Ref Range   WBC, Ur, HPF, POC 10-15    RBC, urine, microscopic TNTC    Bacteria, U Microscopic 1+    Mucus, UA trace    Epithelial cells, urine per micros 2-5    Crystals, Ur, HPF, POC neg    Casts, Ur, LPF, POC neg    Yeast, UA neg   POCT urinalysis dipstick  Result Value Ref Range   Color, UA red    Clarity, UA turbid    Glucose, UA neg    Bilirubin, UA large    Ketones, UA 15    Spec Grav, UA 1.010    Blood, UA large    pH, UA 5.5    Protein, UA >=300    Urobilinogen, UA 2.0    Nitrite, UA neg    Leukocytes, UA large (3+)    ASSESSMENT & PLAN:  UTI Plan: Patient presented with classic symptoms of UTI including dysuria, frequency, and suprapubic pain. But concerning symptoms of hematuria. Her UA concerning with hematuria for large blood, negative nitrates, large leukoesterase,  fairly significant WBCs and +1 bacteria. Urine was sent for urine culture will follow up on sensitivities. Will treat with Ciprofloxacin until sensitivities return. I'm concerned that her hematuria is possibly related to recently being started on 2 antiplatelet therapy with aspirin and brilinta. However given that the patient is only one week status post stent placement these medications cannot be discontinued at this time. Advised patient to call cardiologist tomorrow  morning advise him of the symptoms recommend any adjustments of medication. Common side effects of this medication include bleeding and dysuria. Recommend f/u in ED or [facility] if bleeding continues or worsens.

## 2014-03-04 ENCOUNTER — Telehealth: Payer: Self-pay | Admitting: Physician Assistant

## 2014-03-04 ENCOUNTER — Telehealth: Payer: Self-pay | Admitting: Cardiovascular Disease

## 2014-03-04 NOTE — Telephone Encounter (Signed)
New message      Pt is due to be seen on Monday for a hosp follow up.  Pt was seen in urgent care last night for a UTI--she has blood in her urine and is on brilinta 90mg  bid.  She was prescribed cipro floxaxin  HCL 500mg  bid.  Should she stop brilinta or continue medication until she is seen on Monday?

## 2014-03-04 NOTE — Telephone Encounter (Signed)
The patient had recent myocardial infarction treated with a drug-coated stent. She needs to remain on aspirin and Brilinta for a minimum of 1 year. If she is having gross hematuria, she needs to go the emergency room. If blood was noted on her urinalysis confirming urinary tract infection, this is to be expected and she can safely remain on ASA and Brilinta. Richardson Dopp, PA-C   03/04/2014 5:10 PM

## 2014-03-04 NOTE — Telephone Encounter (Signed)
Patient is still bleeding with clots and painful. Patient states that it is heavy like a period. Advised patient to go to Emergency department to have this checked out. Patient agreed to plan.

## 2014-03-05 LAB — URINE CULTURE
Colony Count: NO GROWTH
Organism ID, Bacteria: NO GROWTH

## 2014-03-06 ENCOUNTER — Encounter: Payer: Self-pay | Admitting: *Deleted

## 2014-03-07 DIAGNOSIS — I5032 Chronic diastolic (congestive) heart failure: Secondary | ICD-10-CM | POA: Insufficient documentation

## 2014-03-07 NOTE — Progress Notes (Signed)
Cardiology Office Note   Date:  03/08/2014   ID:  Hannah Beard, DOB September 20, 1955, MRN [id number]  PCP:  Tamsen Roers, MD  Cardiologist:  Dr. Sherren Mocha     Chief Complaint  Patient presents with  . Hospitalization Follow-up  . Coronary Artery Disease    s/p Inferior STEMI >> DES to CFX     History of Present Illness: Hannah Beard is a 59 y.o. female with a hx of tobacco abuse.  She was admitted 2/5-2/8 with an inferior STEMI.  Initially presented with junctional bradycardia and hypotension (concern for RV infarct).  LHC demonstrated an occluded proximal CFX that was treated with a DES.  She was placed on BB, ACEI, ASA, Brilinta, statin.  Course was c/b by volume excess (diastolic HF) treated with IV Lasix.  She was noted to have mildly elevated Creatinine that improved slightly prior to DC.  Since DC she has called the office with significant hematuria in the setting of UTI.  She was advised to go to the ED given her recent PCI in the setting of a STEMI and need to remain on DAPT.  She is doing well.  The patient denies chest pain, shortness of breath, syncope, orthopnea, PND or significant pedal edema.   She had hematuria for several days.  Say urgent care and took a full course of Cipro.  Chart indicates her culture was negative. She tells me she passed some clots several days ago.  Her hematuria and dysuria has resolved.     Studies/Reports Reviewed Today:  Echocardiogram 02/20/14 - Mild LVH. EF 55-60%. Inf and Inf-Lat HK, Grade 2 diastolic dysfunction - Pericardium, extracardiac: A trivial pericardial effusion was identified.  Cardiac Catheterization and PCI 02/19/14 LCx:  Proximal occluded RCA:  Mid 50-60% EF:  Inf HK, EF 65% PCI:  Stent 3.5x16 mm Promus DES to prox CFX   Past Medical History  Diagnosis Date  . Former smoker   . CAD (coronary artery disease)     a. 02/19/14 inf STEMI s/p DES to LCx   . HLD (hyperlipidemia)   . Obesity   . Hyperglycemia   . CKD  (chronic kidney disease)   . CHF (congestive heart failure)   . Hypertension   . Myocardial infarction     Past Surgical History  Procedure Laterality Date  . Left heart cath N/A 02/19/2014    Procedure: LEFT HEART CATH;  Surgeon: Blane Ohara, MD;  Location: [facility] CATH LAB;  Service: Cardiovascular;  Laterality: N/A;  . Cardiac catheterization    . Tubal ligation       Current Outpatient Prescriptions  Medication Sig Dispense Refill  . aspirin 81 MG chewable tablet Chew 1 tablet (81 mg total) by mouth daily.    Marland Kitchen atorvastatin (LIPITOR) 80 MG tablet Take 1 tablet (80 mg total) by mouth daily at 6 PM. 30 tablet 11  . lisinopril (PRINIVIL,ZESTRIL) 2.5 MG tablet Take 1 tablet (2.5 mg total) by mouth daily. 30 tablet 11  . metoprolol tartrate (LOPRESSOR) 25 MG tablet Take 0.5 tablets (12.5 mg total) by mouth 2 (two) times daily. 30 tablet 11  . nitroGLYCERIN (NITROSTAT) 0.4 MG SL tablet Place 1 tablet (0.4 mg total) under the tongue every 5 (five) minutes x 3 doses as needed for chest pain. 25 tablet 12  . ticagrelor (BRILINTA) 90 MG TABS tablet Take 1 tablet (90 mg total) by mouth 2 (two) times daily. 60 tablet 11   No current facility-administered medications for this visit.  Allergies:   Review of patient's allergies indicates no known allergies.    Social History:  The patient  reports that she quit smoking about 4 years ago. Her smoking use included Cigarettes. She does not have any smokeless tobacco history on file. She reports that she does not drink alcohol or use illicit drugs.   Family History:  The patient's family history includes Cancer in her mother; Diabetes in her brother, brother, maternal grandfather, and mother; Heart attack in her maternal grandfather; Heart disease in her brother, maternal grandfather, maternal grandmother, and mother; Heart failure in her maternal grandmother; Hyperlipidemia in her brother, brother, and mother; Hypertension in her brother, brother,  and mother. There is no history of Stroke.    ROS:   Please see the history of present illness.   Review of Systems  Respiratory: Positive for cough.   Genitourinary: Positive for hematuria.       Recent UTI - tx with antibiotics  All other systems reviewed and are negative.     PHYSICAL EXAM: VS:  BP 133/83 mmHg  Pulse 80  Ht 5\' 5"  (1.651 m)  Wt 248 lb (112.492 kg)  BMI 41.27 kg/m2    Wt Readings from Last 3 Encounters:  03/08/14 248 lb (112.492 kg)  03/03/14 252 lb (114.306 kg)  02/22/14 256 lb 6.4 oz (116.302 kg)     GEN: Well nourished, well developed, in no acute distress HEENT: normal Neck: no JVD, no masses Cardiac:  Normal S1/S2, RRR; no murmur, no rubs or gallops, no edema right wrist without hematoma or mass  Respiratory:  clear to auscultation bilaterally, no wheezing, rhonchi or rales. GI: soft, nontender, nondistended, + BS MS: no deformity or atrophy Skin: warm and dry  Neuro:  CNs II-XII intact, Strength and sensation are intact Psych: Normal affect   EKG:  EKG is ordered today.  It demonstrates:   NSR, HR 80, inf Q waves, low voltage, no change from prior tracing   Recent Labs: 02/21/2014: Hemoglobin 13.0; Platelets 230 02/22/2014: BUN 21; Creatinine 1.29*; Potassium 3.5; Sodium 143    Lipid Panel    Component Value Date/Time   CHOL 221* 02/20/2014 0312   TRIG 156* 02/20/2014 0312   HDL 42 02/20/2014 0312   CHOLHDL 5.3 02/20/2014 0312   VLDL 31 02/20/2014 0312   LDLCALC 148* 02/20/2014 0312      ASSESSMENT AND PLAN:  1.  CAD s/p Recent Inferior STEMI Rx with DES to CFX:  She is doing well since her recent MI.  We discussed the importance of dual antiplatelet therapy. She is adherent with all of her medications.  HR remains in 80s.    -  Continue ASA, Brilinta, Metoprolol, ACEI, statin.    -  Check on Cardiac Rehab at [facility]    -  Increase Metoprolol to 25 mg Twice daily  2.  Hyperlipidemia:  LDL 148 at time of MI.  Continue Statin.    -   Check Lipids and LFTs in 6 weeks.   3.  Glucose Intolerance:  A1c 6.1 in the hospital.  FU with PCP. 4.  CKD:  ? Baseline CKD vs AKI from hypotension/contrast during hospital stay.  Repeat BMET today. 5.  Hematuria:  Reviewed chart.  Cx neg for organism.  She will need close FU with PCP.  Check CBC today.   Current medicines are reviewed at length with the patient today.  The patient does not have concerns regarding medicines.  The following changes have been made:  As above.   Labs/ tests ordered today include:  Orders Placed This Encounter  Procedures  . Basic Metabolic Panel (BMET)  . CBC w/Diff  . Lipid Profile  . Hepatic function panel  . EKG 12-Lead     Disposition:   FU with Dr. Sherren Mocha  in 8 weeks   Signed, Versie Starks, [facility] 03/08/2014 8:49 AM    [address], [place],   [address] Phone: [phone]; Fax: [phone]

## 2014-03-08 ENCOUNTER — Encounter: Payer: Self-pay | Admitting: Physician Assistant

## 2014-03-08 ENCOUNTER — Ambulatory Visit (INDEPENDENT_AMBULATORY_CARE_PROVIDER_SITE_OTHER): Payer: 59 | Admitting: Physician Assistant

## 2014-03-08 VITALS — BP 133/83 | HR 80 | Ht 65.0 in | Wt 248.0 lb

## 2014-03-08 DIAGNOSIS — I2510 Atherosclerotic heart disease of native coronary artery without angina pectoris: Secondary | ICD-10-CM

## 2014-03-08 DIAGNOSIS — N189 Chronic kidney disease, unspecified: Secondary | ICD-10-CM

## 2014-03-08 DIAGNOSIS — R319 Hematuria, unspecified: Secondary | ICD-10-CM

## 2014-03-08 DIAGNOSIS — E785 Hyperlipidemia, unspecified: Secondary | ICD-10-CM

## 2014-03-08 LAB — BASIC METABOLIC PANEL
BUN: 22 mg/dL (ref 6–23)
CO2: 24 mEq/L (ref 19–32)
Calcium: 9.5 mg/dL (ref 8.4–10.5)
Chloride: 107 mEq/L (ref 96–112)
Creatinine, Ser: 1.2 mg/dL (ref 0.40–1.20)
GFR: 49.07 mL/min — ABNORMAL LOW (ref >60.00)
Glucose, Bld: 121 mg/dL — ABNORMAL HIGH (ref 70–99)
Potassium: 4.6 mEq/L (ref 3.5–5.1)
Sodium: 140 mEq/L (ref 135–145)

## 2014-03-08 LAB — CBC WITH DIFFERENTIAL/PLATELET
Basophils Absolute: 0 10*3/uL (ref 0.0–0.1)
Basophils Relative: 0.5 % (ref 0.0–3.0)
Eosinophils Absolute: 0.2 10*3/uL (ref 0.0–0.7)
Eosinophils Relative: 2.5 % (ref 0.0–5.0)
HCT: 37.7 % (ref 36.0–46.0)
Hemoglobin: 13 g/dL (ref 12.0–15.0)
Lymphocytes Relative: 35.5 % (ref 12.0–46.0)
Lymphs Abs: 2.4 10*3/uL (ref 0.7–4.0)
MCHC: 34.6 g/dL (ref 30.0–36.0)
MCV: 87.9 fl (ref 78.0–100.0)
Monocytes Absolute: 0.5 10*3/uL (ref 0.1–1.0)
Monocytes Relative: 8.2 % (ref 3.0–12.0)
Neutro Abs: 3.6 10*3/uL (ref 1.4–7.7)
Neutrophils Relative %: 53.3 % (ref 43.0–77.0)
Platelets: 462 10*3/uL — ABNORMAL HIGH (ref 150.0–400.0)
RBC: 4.29 Mil/uL (ref 3.87–5.11)
RDW: 12.3 % (ref 11.5–15.5)
WBC: 6.7 10*3/uL (ref 4.0–10.5)

## 2014-03-08 MED ORDER — METOPROLOL TARTRATE 25 MG PO TABS: 25.0000 mg | ORAL_TABLET | Freq: Two times a day (BID) | ORAL | Status: DC

## 2014-03-08 NOTE — Patient Instructions (Signed)
Your physician has recommended you make the following change in your medication:  1. INCREASE METOPROLOL TO 25 MG 1 TABLET TWICE DAILY; NEW RX SENT IN WITH NEW DIRECTIONS  LAB WORK TODAY, BMET, CBC W/DIFF  FOLLOW UP WITH DR. Burt Knack IN ABOUT 6-8 WEEKS  FASTING LIPID AND LIVER PANEL TO BE DONE IN 6 WEEKS  CARDIAC REHAB AT [facility]  BE SURE TO FOLLOW UP WITH PCP ABOUT HEMATURIA

## 2014-03-09 ENCOUNTER — Telehealth: Payer: Self-pay | Admitting: *Deleted

## 2014-03-09 NOTE — Telephone Encounter (Signed)
pt notified about lab results with verbal understanding  

## 2014-04-28 ENCOUNTER — Other Ambulatory Visit (INDEPENDENT_AMBULATORY_CARE_PROVIDER_SITE_OTHER): Payer: 59 | Admitting: *Deleted

## 2014-04-28 ENCOUNTER — Ambulatory Visit (INDEPENDENT_AMBULATORY_CARE_PROVIDER_SITE_OTHER): Payer: 59 | Admitting: Cardiovascular Disease

## 2014-04-28 ENCOUNTER — Telehealth: Payer: Self-pay | Admitting: *Deleted

## 2014-04-28 ENCOUNTER — Encounter: Payer: Self-pay | Admitting: Cardiovascular Disease

## 2014-04-28 VITALS — BP 142/90 | HR 61 | Ht 65.0 in | Wt 249.1 lb

## 2014-04-28 DIAGNOSIS — E782 Mixed hyperlipidemia: Secondary | ICD-10-CM

## 2014-04-28 DIAGNOSIS — E785 Hyperlipidemia, unspecified: Secondary | ICD-10-CM

## 2014-04-28 DIAGNOSIS — I2510 Atherosclerotic heart disease of native coronary artery without angina pectoris: Secondary | ICD-10-CM | POA: Diagnosis not present

## 2014-04-28 LAB — LIPID PANEL
Cholesterol: 130 mg/dL (ref 0–200)
HDL: 38.4 mg/dL — ABNORMAL LOW (ref >39.00)
LDL Cholesterol: 68 mg/dL (ref 0–99)
NonHDL: 91.6
Total CHOL/HDL Ratio: 3
Triglycerides: 118 mg/dL (ref 0.0–149.0)
VLDL: 23.6 mg/dL (ref 0.0–40.0)

## 2014-04-28 LAB — HEPATIC FUNCTION PANEL
ALT: 27 U/L (ref 0–35)
AST: 18 U/L (ref 0–37)
Albumin: 4 g/dL (ref 3.5–5.2)
Alkaline Phosphatase: 106 U/L (ref 39–117)
Bilirubin, Direct: 0.1 mg/dL (ref 0.0–0.3)
Total Bilirubin: 0.4 mg/dL (ref 0.2–1.2)
Total Protein: 7.6 g/dL (ref 6.0–8.3)

## 2014-04-28 MED ORDER — LISINOPRIL 5 MG PO TABS: 5.0000 mg | ORAL_TABLET | Freq: Every day | ORAL | Status: DC

## 2014-04-28 NOTE — Telephone Encounter (Signed)
pt notified of lab results with verbal understanding 

## 2014-04-28 NOTE — Patient Instructions (Signed)
Your physician has recommended you make the following change in your medication:  1. INCREASE Lisinopril to 5mg  take one by mouth daily  Your physician wants you to follow-up in: 4 MONTHS with Richardson Dopp PA-C.  You will receive a reminder letter in the mail two months in advance. If you don't receive a letter, please call our office to schedule the follow-up appointment.  Your physician wants you to follow-up in: February 2017 with Dr Burt Knack.  You will receive a reminder letter in the mail two months in advance. If you don't receive a letter, please call our office to schedule the follow-up appointment.

## 2014-04-28 NOTE — Progress Notes (Signed)
Cardiology Office Note   Date:  04/28/2014   ID:  Celestial Barnfield, DOB 12-Feb-1955, MRN [id number]  PCP:  Tamsen Roers, MD  Cardiologist:  Sherren Mocha, MD    Chief Complaint  Patient presents with  . Coronary Artery Disease     History of Present Illness: Hannah Beard is a 59 y.o. female who presents for follow-up of coronary artery disease. She presented with an inferior wall STEMI in February 2016. She was found to have total occlusion of the proximal left circumflex and was treated with primary PCI using a drug-eluting stent. Her post MI course was complicated by congestive heart failure requiring IV diuresis. She was noted to have moderate stenosis of the mid right coronary artery with recommendation for medical therapy for her residual CAD.  She's doing fairly well. Has been walking up to 1/2 mile at a time. She stops and rests for a while once during her walk. Most limited by shortness of breath but this is longstanding and predates her MI. She's had no chest pain or pressure, nausea, vomiting, lightheadedness, or syncope.   She is eager to become more active. Wants to mow her yard, garden, [employer] research [employer], [employer].  Past Medical History  Diagnosis Date  . Former smoker   . CAD (coronary artery disease)     a. 02/19/14 inf STEMI s/p DES to LCx   . HLD (hyperlipidemia)   . Obesity   . Hyperglycemia     A1c 6.1 in 02/2014  . CKD (chronic kidney disease)   . (HFpEF) heart failure with preserved ejection fraction 02/2014    volume excess in setting of STEMI and volume resuscitation (EF preserved)   . Hypertension   . History of ST elevation myocardial infarction (STEMI) 02/2014  . Empyema 08/2009    s/p L VATS and decortication    Past Surgical History  Procedure Laterality Date  . Left heart cath N/A 02/19/2014    Procedure: LEFT HEART CATH;  Surgeon: Blane Ohara, MD;  Location: [facility] CATH LAB;  Service: Cardiovascular;  Laterality: N/A;  . Cardiac catheterization    . Tubal ligation       Current Outpatient Prescriptions  Medication Sig Dispense Refill  . aspirin 81 MG chewable tablet Chew 1 tablet (81 mg total) by mouth daily.    Marland Kitchen atorvastatin (LIPITOR) 80 MG tablet Take 1 tablet (80 mg total) by mouth daily at 6 PM. 30 tablet 11  . lisinopril (PRINIVIL,ZESTRIL) 5 MG tablet Take 1 tablet (5 mg total) by mouth daily. 30 tablet 11  . metoprolol tartrate (LOPRESSOR) 25 MG tablet Take 1 tablet (25 mg total) by mouth 2 (two) times daily. 60 tablet 11  . nitroGLYCERIN (NITROSTAT) 0.4 MG SL tablet Place 1 tablet (0.4 mg total) under the tongue every 5 (five) minutes x 3 doses as needed for chest pain. 25 tablet 12  . ticagrelor (BRILINTA) 90 MG TABS tablet Take 1 tablet (90 mg total) by mouth 2 (two) times daily. 60 tablet 11   No current facility-administered medications for this visit.    Allergies:   Review of patient's allergies indicates no known allergies.   Social History:  The patient  reports that she quit smoking about 4 years ago. Her smoking use included Cigarettes. She does not have any smokeless tobacco history on file. She reports that she does not drink alcohol or use illicit drugs.   Family History:  The patient's family history includes Cancer in her mother; Diabetes in her brother, brother, maternal  grandfather, and mother; Heart attack in her maternal grandfather; Heart disease in her brother, maternal grandfather, maternal grandmother, and mother; Heart failure in her maternal grandmother; Hyperlipidemia in her brother, brother, and mother; Hypertension in her brother, brother, and mother. There is no history of Stroke.    ROS:  Please see the history of present illness.  Otherwise, review of systems is positive for back pain, shoulder pain, shortness of breath.  All other systems are reviewed and negative.   PHYSICAL EXAM: VS:  BP 142/90 mmHg  Pulse 61  Ht 5\' 5"  (1.651 m)  Wt 249 lb 1.9 oz (113 kg)  BMI 41.46 kg/m2  SpO2 97% , BMI Body mass index is  41.46 kg/(m^2). GEN: Well nourished, well developed, in no acute distress HEENT: normal Neck: no JVD, no masses. No carotid bruits Cardiac: RRR without murmur or gallop                Respiratory:  clear to auscultation bilaterally, normal work of breathing GI: soft, nontender, nondistended, + BS MS: no deformity or atrophy Ext: no pretibial edema, pedal pulses 2+= bilaterally Skin: warm and dry, no rash Neuro:  Strength and sensation are intact Psych: euthymic mood, full affect  EKG:  EKG is not ordered today.  Recent Labs: 03/08/2014: BUN 22; Creatinine 1.20; Hemoglobin 13.0; Platelets 462.0*; Potassium 4.6; Sodium 140 04/28/2014: ALT 27   Lipid Panel     Component Value Date/Time   CHOL 130 04/28/2014 0835   TRIG 118.0 04/28/2014 0835   HDL 38.40* 04/28/2014 0835   CHOLHDL 3 04/28/2014 0835   VLDL 23.6 04/28/2014 0835   LDLCALC 68 04/28/2014 0835      Wt Readings from Last 3 Encounters:  04/28/14 249 lb 1.9 oz (113 kg)  03/08/14 248 lb (112.492 kg)  03/03/14 252 lb (114.306 kg)    Cardiac Studies Reviewed: 2-D echocardiogram 02/20/2014: Study Conclusions  - Left ventricle: The cavity size was normal. Wall thickness was increased in a pattern of mild LVH. Systolic function was normal. The estimated ejection fraction was in the range of 55% to 60%. Basal to mid inferior and inferolateral severe hypokinesis. Features are consistent with a pseudonormal left ventricular filling pattern, with concomitant abnormal relaxation and increased filling pressure (grade 2 diastolic dysfunction). - Aortic valve: There was no stenosis. - Mitral valve: Mildly calcified annulus. There was no significant regurgitation. - Right ventricle: The cavity size was normal. Systolic function was normal. - Pulmonary arteries: No complete TR doppler jet so unable to estimate PA systolic pressure. - Inferior vena cava: The vessel was normal in size. The respirophasic  diameter changes were in the normal range (>= 50%), consistent with normal central venous pressure. - Pericardium, extracardiac: A trivial pericardial effusion was identified.  Impressions:  - Normal LV size with mild LV hypertrophy. EF 55%. There was basal to mid inferior and inferolateral severe hypokinesis. Moderate diastolic dysfunction. Normal RV size and systolic function.   ASSESSMENT AND PLAN: 1.  CAD, native vessel, without symptoms of angina: The patient is doing very well now over 2 months out from her history elevation infarction. She will continue dual antiplatelet therapy with aspirin and brilinta for at least one year. I gave her specific instructions on her activity level and I think she can basically returned to a normal level of activity without specific restrictions at this point. She was advised to of course be sensible about how hard she works in the yard and to avoid the hot temperatures  over the summer.  2. Hyperlipidemia: The patient is treated with high-dose atorvastatin. Lipids and LFTs were drawn this morning.  3. Essential hypertension: Blood pressure mildly elevated. I'm going to increase lisinopril 5 mg daily. She'll continue on metoprolol 25 mg twice daily.   Current medicines are reviewed with the patient today.  The patient does not have concerns regarding medicines.  The following changes have been made:  Increase lisinopril to 5 mg daily  Labs/ tests ordered today include:  No orders of the defined types were placed in this encounter.    Disposition:   FU 4 months with Richardson Dopp, PA-C  Signed, Sherren Mocha, MD  04/28/2014 2:03 PM    [facility] [address], [place], [place]  [address] Phone: [phone]; Fax: [phone]

## 2015-02-21 ENCOUNTER — Other Ambulatory Visit: Payer: Self-pay | Admitting: Physician Assistant

## 2015-03-09 ENCOUNTER — Other Ambulatory Visit: Payer: Self-pay | Admitting: Cardiovascular Disease

## 2019-02-25 ENCOUNTER — Ambulatory Visit (INDEPENDENT_AMBULATORY_CARE_PROVIDER_SITE_OTHER): Payer: 59 | Admitting: Internal Medicine

## 2019-02-25 ENCOUNTER — Encounter: Payer: Self-pay | Admitting: Internal Medicine

## 2019-02-25 ENCOUNTER — Other Ambulatory Visit: Payer: Self-pay

## 2019-02-25 DIAGNOSIS — R319 Hematuria, unspecified: Secondary | ICD-10-CM | POA: Diagnosis not present

## 2019-02-25 DIAGNOSIS — Z7689 Persons encountering health services in other specified circumstances: Secondary | ICD-10-CM | POA: Diagnosis not present

## 2019-02-25 NOTE — Progress Notes (Signed)
Virtual Visit via Telephone Note  I connected with Hannah Beard, on 02/25/2019 at 3:15 PM by telephone due to the COVID-19 pandemic and verified that I am speaking with the correct person using two identifiers.   Consent: I discussed the limitations, risks, security and privacy concerns of performing an evaluation and management service by telephone and the availability of in person appointments. I also discussed with the patient that there may be a patient responsible charge related to this service. The patient expressed understanding and agreed to proceed.   Location of Patient: Home   Location of Provider: Clinic    Persons participating in Telemedicine visit: Jayline Munyon [facility] Dr. Juleen China      History of Present Illness: Patient has a visit to establish care. PMH is significant for CKD, CAD with h/o MI in Feb 2015, Chronic HFrEF, HLD. Not currently on any medications. Last time followed by cardiology was 2016. Last time saw physician was around the same time.   Patient reports hematuria. She sees gross blood in the urine in the toilet bowl. She also sometimes notes blood on toilet paper when she wipes. Sometimes passes bloody mucus. Does not believe she has any bleeding from the uterus; she only notices blood when urinating. Never has bleeding in between using bathroom, blood never present on underwear. Denies blood in stools. No melena.This has been occurring for several years on and off. Has gotten worse in the last month. Sees blood at least 15-20 days per month. Denies dysuria, frequency, urgency. Does endorse mild increase in pressure when she is bleeding. No abdominal pain, nausea or vomiting. Quit smoking Aug 2011. Smoked for 20-25 years. She was previously noting the bleeding when on Brilinta and ASA; has continued to have bleeding despite not being on these medications for several years.    Past Medical History:  Diagnosis Date  . (HFpEF) heart failure  with preserved ejection fraction ([facility]) 02/2014   volume excess in setting of STEMI and volume resuscitation (EF preserved)   . CAD (coronary artery disease)    a. 02/19/14 inf STEMI s/p DES to LCx   . CKD (chronic kidney disease)   . Empyema ([place]) 08/2009   s/p L VATS and decortication  . Former smoker   . History of ST elevation myocardial infarction (STEMI) 02/2014  . HLD (hyperlipidemia)   . Hyperglycemia    A1c 6.1 in 02/2014  . Hypertension   . Obesity    No Known Allergies  No current outpatient medications on file prior to visit.   No current facility-administered medications on file prior to visit.    Observations/Objective: NAD. Speaking clearly.  Work of breathing normal.  Alert and oriented. Mood appropriate.   Assessment and Plan: 1. Encounter to establish care   2. Hematuria, unspecified type Patient reporting frequent, gross hematuria. History is concerning and warrants urology evaluation as need to rule out bladder cancer especially given history of smoking. Patient not present in person to obtain UA; will proceed with referral given concerning nature of history/symptoms. Patient had gross hematuria at urgent care visit in 2016 thought to be related to antiplatelet therapy; also treated empirically for UTI at that time. Bleeding has persisted/worsened despite not being on any antiplatelet medication. Patient is very confident that bleeding is coming from urinary tract as does not having bleeding in underwear and does not note blood streaked stools or blood in the rectal area when wiping. However, given age would certainly warrant future screening for colon cancer.  Will also have patient return for annual exam for PAP. If blood does happen to be noted in vagina would also obtain endometrial biopsy and pelvic ultrasound. If urology work up ends up being negative will also plan to proceed with pelvic work up.  - Ambulatory referral to Urology   Follow Up Instructions: With  PCP for annual exam; urology referral for hematuria placed    I discussed the assessment and treatment plan with the patient. The patient was provided an opportunity to ask questions and all were answered. The patient agreed with the plan and demonstrated an understanding of the instructions.   The patient was advised to call back or seek an in-person evaluation if the symptoms worsen or if the condition fails to improve as anticipated.     I provided 18 minutes total of non-face-to-face time during this encounter including median intraservice time, reviewing previous notes, investigations, ordering medications, medical decision making, coordinating care and patient verbalized understanding at the end of the visit.    Phill Myron, D.O. Primary Care at [facility]  02/25/2019, 3:15 PM

## 2019-02-26 ENCOUNTER — Telehealth: Payer: Self-pay

## 2019-02-26 NOTE — Telephone Encounter (Signed)
Patient is scheduled at [facility] on 02/27/2019 @ 9:45 AM.  Hannah Beard [address], [place]  Ph. [phone].  Please notify patient of appointment date & time.

## 2019-02-26 NOTE — Telephone Encounter (Signed)
Called the pt and informed her  

## 2019-03-13 ENCOUNTER — Encounter (HOSPITAL_BASED_OUTPATIENT_CLINIC_OR_DEPARTMENT_OTHER): Payer: Self-pay | Admitting: Urology

## 2019-03-13 ENCOUNTER — Other Ambulatory Visit: Payer: Self-pay

## 2019-03-13 NOTE — Progress Notes (Addendum)
ADDENDUM:  Chart reviewed by anesthesia, Konrad Felix PA, whom also reviewed with Dr Lanetta Inch.  Ok to proceed barring acute status change (refer to Janett Billow progress note)   Spoke w/ via phone for pre-op interview--- PT Lab needs dos---- Istat 8 and EKG              Lab results------ no COVID test ------ 03-14-2019 @ [id number] Arrive at ------- [id number] NPO after ------  MN w/ exception clear liquids until 0730 then nothing by mouth ( no cream/ milk products) Medications to take morning of surgery ----- NONE Diabetic medication ----- n/a Patient Special Instructions ----- n/a Pre-Op special Istructions ----- n/a Patient verbalized understanding of instructions that were given at this phone interview. Patient denies shortness of breath, chest pain, fever, cough a this phone interview.   Anesthesia Review:  Hx CAD s/p  PCI with DES x1 to LCx 02-20-2004 with STEMI post heart failure with preserved ef.  Pt has not seen any doctor  In 5 yrs .  PCP: Cardiologist : Chest x-ray : EKG : Echo : Cardiac Cath :  Sleep Study/ CPAP : Fasting Blood Sugar :      / Checks Blood Sugar -- times a day:   Blood Thinner/ Instructions /Last Dose: ASA / Instructions/ Last Dose :   Patient denies shortness of breath, chest pain, fever, and cough at this phone interview.

## 2019-03-14 ENCOUNTER — Other Ambulatory Visit (HOSPITAL_COMMUNITY)
Admission: RE | Admit: 2019-03-14 | Discharge: 2019-03-14 | Disposition: A | Discharge status: Home / Self Care | Payer: 59 | Source: Ambulatory Visit | Attending: Urology | Admitting: Urology

## 2019-03-14 DIAGNOSIS — Z01812 Encounter for preprocedural laboratory examination: Secondary | ICD-10-CM | POA: Diagnosis not present

## 2019-03-14 DIAGNOSIS — Z20822 Contact with and (suspected) exposure to covid-19: Secondary | ICD-10-CM | POA: Insufficient documentation

## 2019-03-14 LAB — SARS CORONAVIRUS 2 (TAT 6-24 HRS): SARS Coronavirus 2: NEGATIVE

## 2019-03-17 NOTE — Progress Notes (Signed)
Anesthesia Chart Review   Case: [id number] Date/Time: 03/18/19 1330   Procedure: TRANSURETHRAL RESECTION OF BLADDER TUMOR (TURBT) WITH CYSTOSCOPY/POSSIBLE RIGHT STENT PLACEMENT/ INTRAVESICAL INSTILLATION OF GEMCITABINE (N/A )   Anesthesia type: General   Pre-op diagnosis: BLADDER MASS   Location: [facility] OR ROOM 2 / [place]   Surgeons: Ceasar Mons, MD      DISCUSSION:64 y.o. former smoker (quit 08/28/09) with h/o HLD, pre-diabetes, HTN, CAD (DES to LCx 2016), bladder mass scheduled for above procedure 03/18/19 with Dr. Harrell Gave Hannah Beard.   Pt was last seen by cardiologist, Dr. Sherren Mocha, 04/28/2014.  Stable at this visit s/p recent PCI.  She has not followed up since then due to insurance issues.  Pt asymptomatic since this time.   Established care with PCP, Dr. Juleen China, 02/25/2019.  Presented due to hematuria. Referred to urology.     Discussed with Dr. Lanetta Inch.  Anticipate pt can proceed with planned procedure barring acute status change and after evaluation DOS including EKG.  VS: Ht 5' 4.5" (1.638 m)   Wt 114.8 kg   BMI 42.76 kg/m   PROVIDERS: Nicolette Bang, DO is PCP    LABS: Labs DOS (all labs ordered are listed, but only abnormal results are displayed)  Labs Reviewed - No data to display   IMAGES:   EKG:   CV: -- EF 65% on LV-gram. 2D ECHO 02/20/14 with EF 55-60%, mild LVH, basal to mid inferior and inferolateral severe hypokinesis. G2DD. Normal RV size and systolic function. Past Medical History:  Diagnosis Date  . (HFpEF) heart failure with preserved ejection fraction ([place]) 02/2014   volume excess in setting of STEMI and volume resuscitation (EF preserved) post cardiac cath;  last echo 02-20-2014  ef 55-60%. G2DD  . Bladder mass   . CAD (coronary artery disease) previous cardiologist Dr Burt Knack, lov note in epic 04-28-2014  (03-13-2019  per pt has not seen any doctor in approx. 5 yrs ago, but denies any cardiac s&s)   a.  02/19/14 inf STEMI s/p DES to LCx and moderate stenosis midRCA  . Hematuria   . History of pleural empyema    left side  08-30-2009 s/p  left VATS w/ drainage/ decortication  . History of ST elevation myocardial infarction (STEMI)   . HLD (hyperlipidemia)   . Hypertension    03-13-2019  was on medication 5 yrs ago but none since this time due to not having seen any doctor for 5 yrs due to insurance issue's  . Pre-diabetes    watches diet  . S/P drug eluting coronary stent placement    02-20-2004   PCI and DES x1 to LCx  . Wears glasses     Past Surgical History:  Procedure Laterality Date  . DILATION AND CURETTAGE OF UTERUS  yrs ago  . LEFT HEART CATH N/A 02/19/2014   Procedure: LEFT HEART CATH;  Surgeon: Blane Ohara, MD;  Location: [facility] CATH LAB;  Service: Cardiovascular;  Laterality: N/A;  . TUBAL LIGATION Bilateral yrs ago  . VIDEO ASSISTED THORACOSCOPY (VATS)/EMPYEMA Left 08-30-2009   dr hendrickson  @[facility]     MEDICATIONS: No current facility-administered medications for this encounter.   No current outpatient medications on file.    Hannah Beard [facility] Pre-Surgical Testing [phone] 03/17/19  3:04 PM

## 2019-03-18 ENCOUNTER — Ambulatory Visit (HOSPITAL_BASED_OUTPATIENT_CLINIC_OR_DEPARTMENT_OTHER)
Admission: RE | Admit: 2019-03-18 | Discharge: 2019-03-18 | Disposition: A | Discharge status: Home / Self Care | Payer: 59 | Attending: Urology | Admitting: Urology

## 2019-03-18 ENCOUNTER — Encounter (HOSPITAL_BASED_OUTPATIENT_CLINIC_OR_DEPARTMENT_OTHER)
Admission: RE | Disposition: A | Discharge status: Home / Self Care | Payer: Self-pay | Source: Home / Self Care | Attending: Urology

## 2019-03-18 ENCOUNTER — Ambulatory Visit (HOSPITAL_BASED_OUTPATIENT_CLINIC_OR_DEPARTMENT_OTHER): Payer: 59 | Admitting: Physician Assistant

## 2019-03-18 ENCOUNTER — Encounter (HOSPITAL_BASED_OUTPATIENT_CLINIC_OR_DEPARTMENT_OTHER): Payer: Self-pay | Admitting: Urology

## 2019-03-18 ENCOUNTER — Other Ambulatory Visit: Payer: Self-pay | Admitting: Urology

## 2019-03-18 DIAGNOSIS — E785 Hyperlipidemia, unspecified: Secondary | ICD-10-CM | POA: Diagnosis not present

## 2019-03-18 DIAGNOSIS — R7303 Prediabetes: Secondary | ICD-10-CM | POA: Diagnosis not present

## 2019-03-18 DIAGNOSIS — I5032 Chronic diastolic (congestive) heart failure: Secondary | ICD-10-CM | POA: Diagnosis not present

## 2019-03-18 DIAGNOSIS — I110 Hypertensive heart disease with heart failure: Secondary | ICD-10-CM | POA: Diagnosis not present

## 2019-03-18 DIAGNOSIS — N1330 Unspecified hydronephrosis: Secondary | ICD-10-CM | POA: Diagnosis not present

## 2019-03-18 DIAGNOSIS — R310 Gross hematuria: Secondary | ICD-10-CM | POA: Diagnosis not present

## 2019-03-18 DIAGNOSIS — C679 Malignant neoplasm of bladder, unspecified: Secondary | ICD-10-CM | POA: Insufficient documentation

## 2019-03-18 DIAGNOSIS — Z8249 Family history of ischemic heart disease and other diseases of the circulatory system: Secondary | ICD-10-CM | POA: Insufficient documentation

## 2019-03-18 DIAGNOSIS — Z87891 Personal history of nicotine dependence: Secondary | ICD-10-CM | POA: Diagnosis not present

## 2019-03-18 DIAGNOSIS — Z6841 Body Mass Index (BMI) 40.0 and over, adult: Secondary | ICD-10-CM | POA: Insufficient documentation

## 2019-03-18 DIAGNOSIS — I252 Old myocardial infarction: Secondary | ICD-10-CM | POA: Insufficient documentation

## 2019-03-18 DIAGNOSIS — I2510 Atherosclerotic heart disease of native coronary artery without angina pectoris: Secondary | ICD-10-CM | POA: Diagnosis not present

## 2019-03-18 DIAGNOSIS — Z8349 Family history of other endocrine, nutritional and metabolic diseases: Secondary | ICD-10-CM | POA: Insufficient documentation

## 2019-03-18 DIAGNOSIS — Z833 Family history of diabetes mellitus: Secondary | ICD-10-CM | POA: Diagnosis not present

## 2019-03-18 DIAGNOSIS — Z955 Presence of coronary angioplasty implant and graft: Secondary | ICD-10-CM | POA: Diagnosis not present

## 2019-03-18 DIAGNOSIS — D494 Neoplasm of unspecified behavior of bladder: Secondary | ICD-10-CM | POA: Diagnosis present

## 2019-03-18 HISTORY — DX: Presence of coronary angioplasty implant and graft: Z95.5

## 2019-03-18 HISTORY — PX: TRANSURETHRAL RESECTION OF BLADDER TUMOR: SHX2575

## 2019-03-18 HISTORY — DX: Personal history of other diseases of the respiratory system: Z87.09

## 2019-03-18 HISTORY — DX: Prediabetes: R73.03

## 2019-03-18 HISTORY — DX: Presence of spectacles and contact lenses: Z97.3

## 2019-03-18 LAB — POCT I-STAT, CHEM 8
BUN: 21 mg/dL (ref 8–23)
Calcium, Ion: 1.19 mmol/L (ref 1.15–1.40)
Chloride: 106 mmol/L (ref 98–111)
Creatinine, Ser: 1.6 mg/dL — ABNORMAL HIGH (ref 0.44–1.00)
Glucose, Bld: 153 mg/dL — ABNORMAL HIGH (ref 70–99)
HCT: 43 % (ref 36.0–46.0)
Hemoglobin: 14.6 g/dL (ref 12.0–15.0)
Potassium: 3.5 mmol/L (ref 3.5–5.1)
Sodium: 144 mmol/L (ref 135–145)
TCO2: 23 mmol/L (ref 22–32)

## 2019-03-18 LAB — GLUCOSE, CAPILLARY: Glucose-Capillary: 190 mg/dL — ABNORMAL HIGH (ref 70–99)

## 2019-03-18 SURGERY — TURBT (TRANSURETHRAL RESECTION OF BLADDER TUMOR)
Anesthesia: General | Site: Pelvis

## 2019-03-18 MED ORDER — ACETAMINOPHEN 500 MG PO TABS
ORAL_TABLET | ORAL | Status: AC
  Filled 2019-03-18: qty 2

## 2019-03-18 MED ORDER — PHENYLEPHRINE 40 MCG/ML (10ML) SYRINGE FOR IV PUSH (FOR BLOOD PRESSURE SUPPORT)
PREFILLED_SYRINGE | INTRAVENOUS | Status: AC
  Filled 2019-03-18: qty 10

## 2019-03-18 MED ORDER — ACETAMINOPHEN 10 MG/ML IV SOLN
INTRAVENOUS | Status: AC
  Filled 2019-03-18: qty 100

## 2019-03-18 MED ORDER — ALBUMIN HUMAN 5 % IV SOLN
12.5000 g | Freq: Once | INTRAVENOUS | Status: AC
  Administered 2019-03-18: 12.5 g via INTRAVENOUS
  Filled 2019-03-18: qty 250

## 2019-03-18 MED ORDER — SODIUM CHLORIDE 0.9 % IR SOLN
Status: DC | PRN
  Administered 2019-03-18: 3000 mL via INTRAVESICAL
  Administered 2019-03-18 (×2): 6000 mL via INTRAVESICAL

## 2019-03-18 MED ORDER — ROCURONIUM BROMIDE 10 MG/ML (PF) SYRINGE
PREFILLED_SYRINGE | INTRAVENOUS | Status: AC
  Filled 2019-03-18: qty 10

## 2019-03-18 MED ORDER — IOHEXOL 300 MG/ML  SOLN
INTRAMUSCULAR | Status: DC | PRN
  Administered 2019-03-18: 50 mL via URETHRAL

## 2019-03-18 MED ORDER — FENTANYL CITRATE (PF) 100 MCG/2ML IJ SOLN
INTRAMUSCULAR | Status: DC | PRN
  Administered 2019-03-18 (×2): 50 ug via INTRAVENOUS

## 2019-03-18 MED ORDER — CIPROFLOXACIN IN D5W 400 MG/200ML IV SOLN
400.0000 mg | Freq: Once | INTRAVENOUS | Status: AC
  Administered 2019-03-18: 400 mg via INTRAVENOUS
  Filled 2019-03-18: qty 200

## 2019-03-18 MED ORDER — ALBUMIN HUMAN 5 % IV SOLN
12.5000 g | Freq: Once | INTRAVENOUS | Status: AC
  Administered 2019-03-18: 17:00:00 12.5 g via INTRAVENOUS
  Filled 2019-03-18 (×2): qty 250

## 2019-03-18 MED ORDER — CIPROFLOXACIN IN D5W 400 MG/200ML IV SOLN
INTRAVENOUS | Status: AC
  Filled 2019-03-18: qty 200

## 2019-03-18 MED ORDER — PHENYLEPHRINE 40 MCG/ML (10ML) SYRINGE FOR IV PUSH (FOR BLOOD PRESSURE SUPPORT)
PREFILLED_SYRINGE | INTRAVENOUS | Status: DC | PRN
  Administered 2019-03-18: 120 ug via INTRAVENOUS
  Administered 2019-03-18: 160 ug via INTRAVENOUS
  Administered 2019-03-18: 200 ug via INTRAVENOUS
  Administered 2019-03-18: 120 ug via INTRAVENOUS
  Administered 2019-03-18: 240 ug via INTRAVENOUS
  Administered 2019-03-18: 160 ug via INTRAVENOUS

## 2019-03-18 MED ORDER — PROPOFOL 10 MG/ML IV BOLUS
INTRAVENOUS | Status: AC
  Filled 2019-03-18: qty 20

## 2019-03-18 MED ORDER — FENTANYL CITRATE (PF) 100 MCG/2ML IJ SOLN
INTRAMUSCULAR | Status: AC
  Filled 2019-03-18: qty 2

## 2019-03-18 MED ORDER — CEPHALEXIN 500 MG PO CAPS: 500.0000 mg | ORAL_CAPSULE | Freq: Two times a day (BID) | ORAL | 0 refills | Status: AC

## 2019-03-18 MED ORDER — ROCURONIUM BROMIDE 10 MG/ML (PF) SYRINGE
PREFILLED_SYRINGE | INTRAVENOUS | Status: DC | PRN
  Administered 2019-03-18: 5 mg via INTRAVENOUS
  Administered 2019-03-18: 45 mg via INTRAVENOUS

## 2019-03-18 MED ORDER — HYDROCODONE-ACETAMINOPHEN 5-325 MG PO TABS: 1.0000 | ORAL_TABLET | ORAL | 0 refills | Status: DC | PRN

## 2019-03-18 MED ORDER — ALBUMIN HUMAN 5 % IV SOLN
INTRAVENOUS | Status: AC
  Filled 2019-03-18: qty 250

## 2019-03-18 MED ORDER — PHENYLEPHRINE HCL (PRESSORS) 10 MG/ML IV SOLN
INTRAVENOUS | Status: AC
  Filled 2019-03-18: qty 1

## 2019-03-18 MED ORDER — SUCCINYLCHOLINE CHLORIDE 200 MG/10ML IV SOSY
PREFILLED_SYRINGE | INTRAVENOUS | Status: DC | PRN
  Administered 2019-03-18: 190 mg via INTRAVENOUS

## 2019-03-18 MED ORDER — METOPROLOL TARTRATE 5 MG/5ML IV SOLN
INTRAVENOUS | Status: DC | PRN
  Administered 2019-03-18: 1 mg via INTRAVENOUS

## 2019-03-18 MED ORDER — OXYBUTYNIN CHLORIDE 5 MG PO TABS: 5.0000 mg | ORAL_TABLET | Freq: Three times a day (TID) | ORAL | 1 refills | Status: DC | PRN

## 2019-03-18 MED ORDER — ALBUMIN HUMAN 5 % IV SOLN: INTRAVENOUS | Status: DC | PRN

## 2019-03-18 MED ORDER — MIDAZOLAM HCL 2 MG/2ML IJ SOLN
INTRAMUSCULAR | Status: DC | PRN
  Administered 2019-03-18 (×2): 1 mg via INTRAVENOUS

## 2019-03-18 MED ORDER — MIDAZOLAM HCL 2 MG/2ML IJ SOLN
INTRAMUSCULAR | Status: AC
  Filled 2019-03-18: qty 2

## 2019-03-18 MED ORDER — PROPOFOL 10 MG/ML IV BOLUS
INTRAVENOUS | Status: DC | PRN
  Administered 2019-03-18: 80 mg via INTRAVENOUS

## 2019-03-18 MED ORDER — FENTANYL CITRATE (PF) 100 MCG/2ML IJ SOLN
25.0000 ug | INTRAMUSCULAR | Status: DC | PRN
  Filled 2019-03-18: qty 1

## 2019-03-18 MED ORDER — ONDANSETRON HCL 4 MG/2ML IJ SOLN
INTRAMUSCULAR | Status: AC
  Filled 2019-03-18: qty 2

## 2019-03-18 MED ORDER — GEMCITABINE CHEMO FOR BLADDER INSTILLATION 2000 MG: 2000.0000 mg | Freq: Once | INTRAVENOUS | Status: DC

## 2019-03-18 MED ORDER — GEMCITABINE CHEMO FOR BLADDER INSTILLATION 2000 MG
2000.0000 mg | Freq: Once | INTRAVENOUS | Status: AC
  Administered 2019-03-18: 2000 mg via INTRAVESICAL
  Filled 2019-03-18: qty 2000

## 2019-03-18 MED ORDER — ACETAMINOPHEN 10 MG/ML IV SOLN
1000.0000 mg | Freq: Once | INTRAVENOUS | Status: AC
  Administered 2019-03-18: 1000 mg via INTRAVENOUS
  Filled 2019-03-18: qty 100

## 2019-03-18 MED ORDER — LIDOCAINE 2% (20 MG/ML) 5 ML SYRINGE
INTRAMUSCULAR | Status: AC
  Filled 2019-03-18: qty 5

## 2019-03-18 MED ORDER — SUGAMMADEX SODIUM 200 MG/2ML IV SOLN
INTRAVENOUS | Status: DC | PRN
  Administered 2019-03-18: 400 mg via INTRAVENOUS

## 2019-03-18 MED ORDER — ONDANSETRON HCL 4 MG/2ML IJ SOLN
INTRAMUSCULAR | Status: DC | PRN
  Administered 2019-03-18: 4 mg via INTRAVENOUS

## 2019-03-18 MED ORDER — LACTATED RINGERS IV SOLN
INTRAVENOUS | Status: DC
  Filled 2019-03-18: qty 1000

## 2019-03-18 MED ORDER — ACETAMINOPHEN 500 MG PO TABS
1000.0000 mg | ORAL_TABLET | Freq: Once | ORAL | Status: AC
  Administered 2019-03-18: 1000 mg via ORAL
  Filled 2019-03-18: qty 2

## 2019-03-18 MED ORDER — DEXAMETHASONE SODIUM PHOSPHATE 10 MG/ML IJ SOLN
INTRAMUSCULAR | Status: AC
  Filled 2019-03-18: qty 1

## 2019-03-18 MED ORDER — PHENYLEPHRINE HCL-NACL 10-0.9 MG/250ML-% IV SOLN
INTRAVENOUS | Status: DC | PRN
  Administered 2019-03-18 (×2): 50 ug/min via INTRAVENOUS

## 2019-03-18 MED ORDER — DEXAMETHASONE SODIUM PHOSPHATE 10 MG/ML IJ SOLN
INTRAMUSCULAR | Status: DC | PRN
  Administered 2019-03-18: 8 mg via INTRAVENOUS

## 2019-03-18 MED ORDER — LIDOCAINE 2% (20 MG/ML) 5 ML SYRINGE
INTRAMUSCULAR | Status: DC | PRN
  Administered 2019-03-18: 40 mg via INTRAVENOUS

## 2019-03-18 SURGICAL SUPPLY — 23 items
BAG DRAIN URO-CYSTO SKYTR STRL (DRAIN) ×3 IMPLANT
BAG URINE DRAIN 2000ML AR STRL (UROLOGICAL SUPPLIES) ×3 IMPLANT
BAG URINE LEG 500ML (DRAIN) IMPLANT
CATH FOLEY 3WAY 30CC 22FR (CATHETERS) ×3 IMPLANT
CATH URET 5FR 28IN OPEN ENDED (CATHETERS) ×3 IMPLANT
GLOVE BIO SURGEON STRL SZ7.5 (GLOVE) ×3 IMPLANT
GOWN STRL REUS W/TWL XL LVL3 (GOWN DISPOSABLE) ×3 IMPLANT
GUIDEWIRE ANG ZIPWIRE 038X150 (WIRE) ×3 IMPLANT
HOLDER FOLEY CATH W/STRAP (MISCELLANEOUS) ×3 IMPLANT
IV NS IRRIG 3000ML ARTHROMATIC (IV SOLUTION) ×15 IMPLANT
LOOP CUT BIPOLAR 24F LRG (ELECTROSURGICAL) ×3 IMPLANT
MANIFOLD NEPTUNE II (INSTRUMENTS) ×3 IMPLANT
NS IRRIG 500ML POUR BTL (IV SOLUTION) IMPLANT
PACK CYSTO (CUSTOM PROCEDURE TRAY) ×3 IMPLANT
PLUG CATH AND CAP STER (CATHETERS) ×3 IMPLANT
STENT URET 6FRX24 CONTOUR (STENTS) ×3 IMPLANT
SYR TOOMEY IRRIG 70ML (MISCELLANEOUS) ×3
SYRINGE TOOMEY IRRIG 70ML (MISCELLANEOUS) ×1 IMPLANT
TUBE CONNECTING 12'X1/4 (SUCTIONS) ×1
TUBE CONNECTING 12X1/4 (SUCTIONS) ×2 IMPLANT
TUBING UROLOGY SET (TUBING) ×3 IMPLANT
WATER STERILE IRR 3000ML UROMA (IV SOLUTION) IMPLANT
WATER STERILE IRR 500ML POUR (IV SOLUTION) ×9 IMPLANT

## 2019-03-18 NOTE — Progress Notes (Signed)
Patient stated that at least I don't have all that pressure on my bladder since it was drained.

## 2019-03-18 NOTE — Anesthesia Procedure Notes (Signed)
Procedure Name: Intubation Date/Time: 03/18/2019 3:04 PM Performed by: Mechele Claude, CRNA Pre-anesthesia Checklist: Patient identified, Emergency Drugs available, Suction available and Patient being monitored Patient Re-evaluated:Patient Re-evaluated prior to induction Oxygen Delivery Method: Circle system utilized Preoxygenation: Pre-oxygenation with 100% oxygen Induction Type: IV induction Ventilation: Mask ventilation without difficulty Laryngoscope Size: Mac and 3 Grade View: Grade I Tube type: Oral Number of attempts: 1 Airway Equipment and Method: Stylet Placement Confirmation: ETT inserted through vocal cords under direct vision,  positive ETCO2 and breath sounds checked- equal and bilateral Secured at: 21 cm Tube secured with: Tape Dental Injury: Teeth and Oropharynx as per pre-operative assessment

## 2019-03-18 NOTE — Anesthesia Preprocedure Evaluation (Addendum)
Anesthesia Evaluation  Patient identified by MRN, date of birth, ID band Patient awake    Reviewed: Allergy & Precautions, NPO status , Patient's Chart, lab work & pertinent test results  Airway Mallampati: I  TM Distance: >3 FB Neck ROM: Full    Dental no notable dental hx. (+) Poor Dentition, Chipped, Dental Advisory Given,    Pulmonary neg pulmonary ROS, former smoker,    Pulmonary exam normal breath sounds clear to auscultation       Cardiovascular hypertension, Pt. on medications + CAD, + Past MI, + Cardiac Stents (DES to LCx 2006) and +CHF  Normal cardiovascular exam Rhythm:Regular Rate:Normal  EF 65% on LV-gram.  2D ECHO 02/20/14 with EF 55-60%, mild LVH, basal to mid inferior and inferolateral severe hypokinesis. G2DD. Normal RV size and systolic function   Neuro/Psych negative neurological ROS  negative psych ROS   GI/Hepatic negative GI ROS, Neg liver ROS,   Endo/Other  Morbid obesity  Renal/GU Renal InsufficiencyRenal disease  negative genitourinary   Musculoskeletal negative musculoskeletal ROS (+)   Abdominal   Peds  Hematology negative hematology ROS (+)   Anesthesia Other Findings Bladder mass  Reproductive/Obstetrics                            Anesthesia Physical Anesthesia Plan  ASA: III  Anesthesia Plan: General   Post-op Pain Management:    Induction: Intravenous  PONV Risk Score and Plan: 3 and Midazolam, Dexamethasone and Ondansetron  Airway Management Planned: Oral ETT  Additional Equipment:   Intra-op Plan:   Post-operative Plan: Extubation in OR  Informed Consent: I have reviewed the patients History and Physical, chart, labs and discussed the procedure including the risks, benefits and alternatives for the proposed anesthesia with the patient or authorized representative who has indicated his/her understanding and acceptance.     Dental advisory  given  Plan Discussed with: CRNA  Anesthesia Plan Comments:        Anesthesia Quick Evaluation

## 2019-03-18 NOTE — Progress Notes (Signed)
Dr. Ellison Hughs notified that when patient's catheter reconnected to drain gemcitabine, there was very little urine to drain out, no plug in 3rd port of catheter and patient's bed was wet.  Asked if we should obtain a hemoblobin, or get a hemocue. Dr. Lovena Neighbours stated not needed at this point and okay to discharge home.

## 2019-03-18 NOTE — Discharge Instructions (Signed)

## 2019-03-18 NOTE — H&P (Signed)
Urology Preoperative H&P   Chief Complaint: Bladder mass  History of Present Illness: Hannah Beard is a 64 y.o. female with a 2-year history of intermittent episodes of painless gross hematuria.  She had a CT urogram on 03/13/2019 that showed a 6 cm solid and enhancing bladder mass with mild right-sided hydronephrosis.  She continued to have intermittent episodes of gross hematuria, urinary hesitancy and the sensation of incomplete bladder emptying.  She denies interval urinary tract infections or dysuria.    Past Medical History:  Diagnosis Date  . (HFpEF) heart failure with preserved ejection fraction ([place]) 02/2014   volume excess in setting of STEMI and volume resuscitation (EF preserved) post cardiac cath;  last echo 02-20-2014  ef 55-60%. G2DD  . Bladder mass   . CAD (coronary artery disease) previous cardiologist Dr Burt Knack, lov note in epic 04-28-2014  (03-13-2019  per pt has not seen any doctor in approx. 5 yrs ago, but denies any cardiac s&s)   a. 02/19/14 inf STEMI s/p DES to LCx and moderate stenosis midRCA  . Hematuria   . History of pleural empyema    left side  08-30-2009 s/p  left VATS w/ drainage/ decortication  . History of ST elevation myocardial infarction (STEMI)   . HLD (hyperlipidemia)   . Hypertension    03-13-2019  was on medication 5 yrs ago but none since this time due to not having seen any doctor for 5 yrs due to insurance issue's  . Pre-diabetes    watches diet  . S/P drug eluting coronary stent placement    02-20-2004   PCI and DES x1 to LCx  . Wears glasses     Past Surgical History:  Procedure Laterality Date  . DILATION AND CURETTAGE OF UTERUS  yrs ago  . LEFT HEART CATH N/A 02/19/2014   Procedure: LEFT HEART CATH;  Surgeon: Blane Ohara, MD;  Location: [facility] CATH LAB;  Service: Cardiovascular;  Laterality: N/A;  . TUBAL LIGATION Bilateral yrs ago  . VIDEO ASSISTED THORACOSCOPY (VATS)/EMPYEMA Left 08-30-2009   dr hendrickson  @[facility]     Allergies: No  Known Allergies  Family History  Problem Relation Age of Onset  . Cancer Mother   . Diabetes Mother   . Heart disease Mother   . Hyperlipidemia Mother   . Hypertension Mother   . Heart disease Maternal Grandmother   . Heart failure Maternal Grandmother   . Heart disease Maternal Grandfather   . Diabetes Maternal Grandfather   . Heart attack Maternal Grandfather   . Diabetes Brother   . Heart disease Brother   . Hyperlipidemia Brother   . Hypertension Brother   . Diabetes Brother   . Hyperlipidemia Brother   . Hypertension Brother   . Stroke Neg Hx     Social History:  reports that she quit smoking about 9 years ago. Her smoking use included cigarettes. She quit after 20.00 years of use. She has never used smokeless tobacco. She reports current alcohol use. She reports that she does not use drugs.  ROS: A complete review of systems was performed.  All systems are negative except for pertinent findings as noted.  Physical Exam:  Vital signs in last 24 hours:   Constitutional:  Alert and oriented, No acute distress Cardiovascular: Regular rate and rhythm, No JVD Respiratory: Normal respiratory effort, Lungs clear bilaterally GI: Abdomen is soft, nontender, nondistended, no abdominal masses GU: No CVA tenderness Lymphatic: No lymphadenopathy Neurologic: Grossly intact, no focal deficits Psychiatric: Normal mood  and affect  Laboratory Data:  No results for input(s): WBC, HGB, HCT, PLT in the last 72 hours.  No results for input(s): NA, K, CL, GLUCOSE, BUN, CALCIUM, CREATININE in the last 72 hours.  Invalid input(s): CO3   No results found for this or any previous visit (from the past 24 hour(s)). Recent Results (from the past 240 hour(s))  SARS CORONAVIRUS 2 (TAT 6-24 HRS) Nasopharyngeal Nasopharyngeal Swab     Status: None   Collection Time: 03/14/19  5:14 PM   Specimen: Nasopharyngeal Swab  Result Value Ref Range Status   SARS Coronavirus 2 NEGATIVE NEGATIVE Final     Comment: (NOTE) SARS-CoV-2 target nucleic acids are NOT DETECTED. The SARS-CoV-2 RNA is generally detectable in upper and lower respiratory specimens during the acute phase of infection. Negative results do not preclude SARS-CoV-2 infection, do not rule out co-infections with other pathogens, and should not be used as the sole basis for treatment or other patient management decisions. Negative results must be combined with clinical observations, patient history, and epidemiological information. The expected result is Negative. Fact Sheet for Patients: SugarRoll.be Fact Sheet for Healthcare Providers: https://www.woods-mathews.com/ This test is not yet approved or cleared by the [place] FDA and  has been authorized for detection and/or diagnosis of SARS-CoV-2 by FDA under an Emergency Use Authorization (EUA). This EUA will remain  in effect (meaning this test can be used) for the duration of the COVID-19 declaration under Section 56 4(b)(1) of the Act, 21 U.S.C. section 360bbb-3(b)(1), unless the authorization is terminated or revoked sooner. Performed at [facility], [place] [address] [place], [place] [address]     Renal Function: No results for input(s): CREATININE in the last 168 hours. CrCl cannot be calculated (Patient's most recent lab result is older than the maximum 21 days allowed.).  Radiologic Imaging:  CLINICAL DATA: Gross hematuria   EXAM:  CT ABDOMEN AND PELVIS WITHOUT AND WITH CONTRAST   TECHNIQUE:  Multidetector CT imaging of the abdomen and pelvis was performed  following the standard protocol before and following the bolus  administration of intravenous contrast.   CONTRAST: 125 mL Omnipaque 300 IV   COMPARISON: Partial comparison to CTA chest dated 08/28/2009   FINDINGS:  Lower chest: Mild nodular scarring at the medial left lung base,  likely post infectious/inflammatory when correlating with  remote  prior CTA chest. Mild compressive atelectasis in medial right lower  lobe.   Hepatobiliary: Moderate hepatic steatosis. No suspicious/enhancing  hepatic lesions.   Cholelithiasis with gallbladder sludge. No associated inflammatory  changes. No intrahepatic ductal dilatation. Common duct measures 9  mm and smoothly tapers at the ampulla.   Pancreas: Within normal limits.   Spleen: Within normal limits.   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   Right adrenal glands are within normal limits.   Left kidney is within normal limits.   Right kidney is notable for moderate right hydroureteronephrosis.   Associated 4.7 x 6.0 cm right posterolateral bladder mass at the  right ureteral orifice (series 5/image 79).   On delayed imaging, there are no filling defects in the left upper  pole collecting system or ureter. Right upper pole collecting system  and ureter remain unopacified.   Stomach/Bowel: Stomach is within normal limits.   No evidence of bowel obstruction.   Normal appendix (series 5/image 60).   Vascular/Lymphatic: No  evidence of abdominal aortic aneurysm.   Atherosclerotic calcifications of the abdominal aorta and branch  vessels.   No suspicious abdominopelvic lymphadenopathy.   Reproductive: Uterus is within normal limits.   Bilateral ovaries are unremarkable.   Other: No abdominopelvic ascites.   Musculoskeletal: Mild degenerative changes of the visualized  thoracolumbar spine.   IMPRESSION:  4.7 x 6.0 cm right posterolateral bladder mass at the right ureteral  orifice, compatible with urothelial neoplasm. Cystoscopy is  suggested.   Associated moderate right hydroureteronephrosis.   Additional ancillary findings as above.   These results will be called to the ordering clinician or  representative by  the Radiologist Assistant, and communication  documented in the PACS or zVision Dashboard.    Electronically Signed  By: Julian Hy M.D.  On: 03/13/2019 09:32   I independently reviewed the above imaging studies.  Assessment and Plan Wave Pocock is a 64 y.o. 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  -The risks, benefits and alternatives of cystoscopy with TURBT and gemcitabine instillation was discussed with the patient.  The risks include, but are not limited to, bleeding,  urinary tract infection, bladder perforation requiring prolonged catheterization and/or open bladder repair, ureteral obstruction, voiding dysfunction and the inherent risks of general anesthesia.  The patient voices understanding and wishes to proceed.    Ellison Hughs, MD 03/18/2019, 11:06 AM  [facility] Pager: [phone]

## 2019-03-18 NOTE — Transfer of Care (Signed)
  Last Vitals:  Vitals Value Taken Time  BP 144/77 03/18/19 1700  Temp 37.2 C 03/18/19 1652  Pulse 76 03/18/19 1703  Resp 12 03/18/19 1703  SpO2 100 % 03/18/19 1703  Vitals shown include unvalidated device data.  Last Pain:  Vitals:   03/18/19 1652  TempSrc:   PainSc: 8       Patients Stated Pain Goal: 5 ([id number])  Complications:Immediate Anesthesia Transfer of Care Note  Patient: Hannah Beard  Procedure(s) Performed: Procedure(s) (LRB): TRANSURETHRAL RESECTION OF BLADDER TUMOR (TURBT) WITH CYSTOSCOPY RIGHT STENT PLACEMENT/ INTRAVESICAL INSTILLATION OF GEMCITABINE (N/A)  Patient Location: PACU  Anesthesia Type: General  Level of Consciousness: awake, alert  and oriented  Airway & Oxygen Therapy: Patient Spontanous Breathing and Patient connected to nasal cannula oxygen  Post-op Assessment: Report given to PACU RN and Post -op Vital signs reviewed and stable  Post vital signs: Reviewed and stable  Complications: No apparent anesthesia complications

## 2019-03-18 NOTE — Anesthesia Postprocedure Evaluation (Signed)
Anesthesia Post Note  Patient: Hannah Beard  Procedure(s) Performed: TRANSURETHRAL RESECTION OF BLADDER TUMOR (TURBT) WITH CYSTOSCOPY RIGHT STENT PLACEMENT/ INTRAVESICAL INSTILLATION OF GEMCITABINE (N/A Pelvis)     Patient location during evaluation: PACU Anesthesia Type: General Level of consciousness: awake and alert Pain management: pain level controlled Vital Signs Assessment: post-procedure vital signs reviewed and stable Respiratory status: spontaneous breathing, nonlabored ventilation, respiratory function stable and patient connected to nasal cannula oxygen Cardiovascular status: blood pressure returned to baseline and stable Postop Assessment: no apparent nausea or vomiting Anesthetic complications: no    Last Vitals:  Vitals:   03/18/19 1910 03/18/19 1915  BP: (!) 107/55 (!) 113/53  Pulse: 80 81  Resp: 14 19  Temp:    SpO2: 96% 97%    Last Pain:  Vitals:   03/18/19 1652  TempSrc:   PainSc: 8                  Barnet Glasgow

## 2019-03-18 NOTE — Op Note (Signed)
Operative Note  Preoperative diagnosis:  1.  6 cm bladder mass 2.  Right-sided hydronephrosis  Postoperative diagnosis: 1.  6 cm bladder tumor directly involving the right ureteral orifice 2.  Right hydronephrosis  Procedure(s): 1.  Cystoscopy with TURBT (large) 2.  Right JJ stent placement 3.  Instillation of intravesical chemotherapy 4.  Right retrograde pyelogram 5.  Cystogram  Surgeon: Ellison Hughs, MD  Assistants:  None  Anesthesia:  General  Complications:  None  EBL: 500 mL  Specimens: 1.  Superficial and deep margins of bladder tumor  Drains/Catheters: 1.  22 French three-way Foley catheter with 10 mL in the balloon 2.  6 French, 24 cm JJ stent without tether  Intraoperative findings:   1. 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 Solitary left collecting system with no filling defects or dilation involving the left ureter or left renal pelvis seen on retrograde pyelogram 3. Cystogram revealed no evidence of bladder perforation with the bladder filled up to 500 mL and then completely drained.  Indication:  Hannah Beard is a 64 y.o. female with a 2-year history of intermittent episodes of painless gross hematuria.  She had a CT urogram on 03/13/2019 that showed a 6 cm solid and enhancing bladder mass with mild right-sided hydronephrosis.    She has been consented for the above procedures, voices understanding and wishes to proceed.  Description of procedure:  The patient was taken to the operating room and administered general anesthesia. They were then placed on the table and moved to the dorsal lithotomy position after which the genitalia was sterilely prepped and draped. An official timeout was then performed.  The 84 French resectoscope with the 30 lens and visual obturator were then passed into the bladder under direct visualization. Urethra appeared normal.  The visual obturator was then removed and the Gyrus resectoscope element with 30  lens was then inserted and the bladder was fully and systematically inspected, which revealed an extremely large bladder tumor with papillary features.  The bladder tumor was systematically resected down to the base .  I then took deep margins and send that tissue off as a separate specimen for pathologic analysis.  Reinspection of the bladder revealed all obvious tumor had been fully resected and there was no evidence of perforation. The Microvasive evacuator was then used to irrigate the bladder and remove all of the portions of bladder tumor which were sent to pathology.  A cystogram was performed, which revealed a smooth contour of the bladder with no evidence of bladder perforation with the bladder distended up to 500 mL and then completely drained.  I then removed the resectoscope.  A 22 French Foley catheter was then inserted in the bladder and irrigated. The irrigant returned slightly pink with no clots. The patient was awakened and taken to the recovery room.  While in the recovery room 2000 mg of gemcitabine in 50 mL of water was instilled in the bladder through the catheter and the catheter was plugged. This will remain indwelling for approximately one hour. It will then be drained from the bladder and the catheter will be removed and the patient discharged home.   Plan: The patient has been instructed to remove her Foley catheter at 7 AM on 03/20/2019.  She will follow-up in 2 weeks for stent removal and to discuss her pathology results.

## 2019-03-18 NOTE — Progress Notes (Signed)
Continuing to monitor patient and IV fluids continuing to infuse.

## 2019-03-18 NOTE — Progress Notes (Signed)
Informed Dr. Valma Cava that patient has flipped QRS, total of 65 cc urine output in little over 2 hours,patient's weight, and blood pressures since start of 4th albumin. States "patient is dry, and needs the rest of the IV fluids".

## 2019-03-20 LAB — SURGICAL PATHOLOGY

## 2019-03-25 ENCOUNTER — Telehealth: Payer: Self-pay

## 2019-03-25 NOTE — Telephone Encounter (Signed)

## 2019-03-25 NOTE — Patient Instructions (Signed)

## 2019-03-26 ENCOUNTER — Ambulatory Visit (INDEPENDENT_AMBULATORY_CARE_PROVIDER_SITE_OTHER): Payer: 59 | Admitting: Internal Medicine

## 2019-03-26 ENCOUNTER — Other Ambulatory Visit: Payer: Self-pay

## 2019-03-26 ENCOUNTER — Encounter: Payer: Self-pay | Admitting: Internal Medicine

## 2019-03-26 VITALS — BP 124/72 | HR 93 | Temp 97.3°F | Resp 17 | Ht 64.0 in | Wt 246.0 lb

## 2019-03-26 DIAGNOSIS — Z0000 Encounter for general adult medical examination without abnormal findings: Secondary | ICD-10-CM

## 2019-03-26 DIAGNOSIS — Z13220 Encounter for screening for lipoid disorders: Secondary | ICD-10-CM

## 2019-03-26 DIAGNOSIS — Z1231 Encounter for screening mammogram for malignant neoplasm of breast: Secondary | ICD-10-CM | POA: Diagnosis not present

## 2019-03-26 DIAGNOSIS — Z1159 Encounter for screening for other viral diseases: Secondary | ICD-10-CM | POA: Diagnosis not present

## 2019-03-26 DIAGNOSIS — Z13228 Encounter for screening for other metabolic disorders: Secondary | ICD-10-CM

## 2019-03-26 DIAGNOSIS — L309 Dermatitis, unspecified: Secondary | ICD-10-CM

## 2019-03-26 MED ORDER — HYDROCORTISONE 2 % EX LOTN: 1.0000 "application " | TOPICAL_LOTION | Freq: Two times a day (BID) | CUTANEOUS | 1 refills | Status: DC

## 2019-03-26 NOTE — Progress Notes (Signed)
Subjective:    Hannah Beard - 64 y.o. female MRN [id number]  Date of birth: Feb 05, 1955  HPI  Hannah Beard is here for annual exam. Recently underwent a transurethral resection for bladder mass concerning for neoplasm. Does have some skin concerns. Patches of itchy, scaling skin present behind her ears and on her forehead. Uses Hydrocortisone 1% and has decent relief but can't get the spots to go fully away.      Health Maintenance:  Health Maintenance Due  Topic Date Due  . Hepatitis C Screening  Never done  . PAP SMEAR-Modifier  Never done  . MAMMOGRAM  Never done    -  reports that she quit smoking about 9 years ago. Her smoking use included cigarettes. She quit after 20.00 years of use. She has never used smokeless tobacco. - Review of Systems: Per HPI. - Past Medical History: Patient Active Problem List   Diagnosis Date Noted  . Chronic diastolic CHF (congestive heart failure) ([place]) 03/07/2014  . ST elevation myocardial infarction (STEMI) of inferior wall ([place]) 02/22/2014  . Former smoker   . CKD (chronic kidney disease)   . HLD (hyperlipidemia)   . CAD (coronary artery disease)   . Bradycardia 02/21/2014  . Acute kidney injury ([place]) 02/21/2014  . Acute diastolic CHF (congestive heart failure) ([place]) 02/21/2014  . CAD (coronary artery disease), native coronary artery 02/21/2014   - Medications: reviewed and updated   Objective:   Physical Exam BP 124/72   Pulse 93   Temp (!) 97.3 F (36.3 C) (Temporal)   Resp 17   Ht 5\' 4"  (1.626 m)   Wt 246 lb (111.6 kg)   SpO2 95%   BMI 42.23 kg/m  Physical Exam  Constitutional: She is oriented to person, place, and time and well-developed, well-nourished, and in no distress.  HENT:  Head: Normocephalic and atraumatic.  Mouth/Throat: Oropharynx is clear and moist.  Eyes: Pupils are equal, round, and reactive to light. Conjunctivae and EOM are normal.  Neck: No thyromegaly present.  Cardiovascular: Normal rate,  regular rhythm, normal heart sounds and intact distal pulses.  No murmur heard. Pulmonary/Chest: Effort normal and breath sounds normal. No respiratory distress. She has no wheezes.  Abdominal: Soft. Bowel sounds are normal. She exhibits no distension. There is no abdominal tenderness. There is no rebound and no guarding.  Musculoskeletal:        General: No deformity or edema. Normal range of motion.     Cervical back: Normal range of motion and neck supple.  Lymphadenopathy:    She has no cervical adenopathy.  Neurological: She is alert and oriented to person, place, and time. Gait normal.  Skin: Skin is warm and dry. She is not diaphoretic.  Small area of dry, erythematous, flaking skin on forehead between eye brows.   Psychiatric: Mood, affect and judgment normal.       Assessment & Plan:  1. Annual physical exam Will defer PAP for now per patient request with recent bladder surgery.  - CBC with Differential - Lipid Panel  2. Breast cancer screening by mammogram - MM Digital Screening; Future  3. Lipid screening - Lipid Panel  4. Need for hepatitis C screening test - Hepatitis C Antibody  5. Screening for metabolic disorder - CBC with Differential - Lipid Panel - Comprehensive metabolic panel  6. Eczema, unspecified type Appears consistent with mild eczema. Will prescribe slightly stronger steroid cream than what patient has been using OTC. Discussed avoiding use of more potent steroids  on the face.  - HYDROCORTISONE, TOPICAL, 2 % LOTN; Apply 1 application topically in the morning and at bedtime.  Dispense: 59.2 mL; Refill: [place], D.O. 03/26/2019, 8:59 AM Primary Care at [facility]

## 2019-03-27 ENCOUNTER — Other Ambulatory Visit: Payer: Self-pay | Admitting: Internal Medicine

## 2019-03-27 DIAGNOSIS — D649 Anemia, unspecified: Secondary | ICD-10-CM

## 2019-03-27 DIAGNOSIS — R7309 Other abnormal glucose: Secondary | ICD-10-CM

## 2019-03-27 LAB — CBC WITH DIFFERENTIAL/PLATELET
Basophils Absolute: 0 10*3/uL (ref 0.0–0.2)
Basos: 0 %
EOS (ABSOLUTE): 0.1 10*3/uL (ref 0.0–0.4)
Eos: 1 %
Hematocrit: 24.1 % — ABNORMAL LOW (ref 34.0–46.6)
Hemoglobin: 7.8 g/dL — ABNORMAL LOW (ref 11.1–15.9)
Immature Grans (Abs): 0 10*3/uL (ref 0.0–0.1)
Immature Granulocytes: 0 %
Lymphocytes Absolute: 1.9 10*3/uL (ref 0.7–3.1)
Lymphs: 21 %
MCH: 30.8 pg (ref 26.6–33.0)
MCHC: 32.4 g/dL (ref 31.5–35.7)
MCV: 95 fL (ref 79–97)
Monocytes Absolute: 0.7 10*3/uL (ref 0.1–0.9)
Monocytes: 8 %
Neutrophils Absolute: 6.3 10*3/uL (ref 1.4–7.0)
Neutrophils: 70 %
Platelets: 294 10*3/uL (ref 150–450)
RBC: 2.53 x10E6/uL — CL (ref 3.77–5.28)
RDW: 12.1 % (ref 11.7–15.4)
WBC: 9 10*3/uL (ref 3.4–10.8)

## 2019-03-27 LAB — COMPREHENSIVE METABOLIC PANEL
ALT: 14 IU/L (ref 0–32)
AST: 15 IU/L (ref 0–40)
Albumin/Globulin Ratio: 1.4 (ref 1.2–2.2)
Albumin: 3.7 g/dL — ABNORMAL LOW (ref 3.8–4.8)
Alkaline Phosphatase: 87 IU/L (ref 39–117)
BUN/Creatinine Ratio: 10 — ABNORMAL LOW (ref 12–28)
BUN: 13 mg/dL (ref 8–27)
Bilirubin Total: 0.2 mg/dL (ref 0.0–1.2)
CO2: 24 mmol/L (ref 20–29)
Calcium: 8.8 mg/dL (ref 8.7–10.3)
Chloride: 105 mmol/L (ref 96–106)
Creatinine, Ser: 1.26 mg/dL — ABNORMAL HIGH (ref 0.57–1.00)
GFR calc Af Amer: 53 mL/min/{1.73_m2} — ABNORMAL LOW (ref >59)
GFR calc non Af Amer: 46 mL/min/{1.73_m2} — ABNORMAL LOW (ref >59)
Globulin, Total: 2.7 g/dL (ref 1.5–4.5)
Glucose: 147 mg/dL — ABNORMAL HIGH (ref 65–99)
Potassium: 3.8 mmol/L (ref 3.5–5.2)
Sodium: 142 mmol/L (ref 134–144)
Total Protein: 6.4 g/dL (ref 6.0–8.5)

## 2019-03-27 LAB — LIPID PANEL
Chol/HDL Ratio: 3.6 ratio (ref 0.0–4.4)
Cholesterol, Total: 145 mg/dL (ref 100–199)
HDL: 40 mg/dL (ref >39)
LDL Chol Calc (NIH): 88 mg/dL (ref 0–99)
Triglycerides: 87 mg/dL (ref 0–149)
VLDL Cholesterol Cal: 17 mg/dL (ref 5–40)

## 2019-03-27 LAB — HEPATITIS C ANTIBODY: Hep C Virus Ab: <0.1 s/co ratio (ref 0.0–0.9)

## 2019-03-27 MED ORDER — ATORVASTATIN CALCIUM 80 MG PO TABS: 80.0000 mg | ORAL_TABLET | Freq: Every day | ORAL | 3 refills | Status: DC

## 2019-03-27 NOTE — Progress Notes (Signed)
Patient notified of results & recommendations. Expressed understanding. Is agreeable to starting Statin  Made lab appointment for 03/30/2019 at 8:45 AM.

## 2019-03-30 ENCOUNTER — Other Ambulatory Visit: Payer: Self-pay

## 2019-03-30 ENCOUNTER — Other Ambulatory Visit (INDEPENDENT_AMBULATORY_CARE_PROVIDER_SITE_OTHER): Payer: 59

## 2019-03-30 DIAGNOSIS — R7309 Other abnormal glucose: Secondary | ICD-10-CM | POA: Diagnosis not present

## 2019-03-30 DIAGNOSIS — D649 Anemia, unspecified: Secondary | ICD-10-CM

## 2019-03-30 NOTE — Progress Notes (Signed)
Patient here for repeat CBC & an A1C.

## 2019-03-31 LAB — HEMOGLOBIN A1C
Est. average glucose Bld gHb Est-mCnc: 114 mg/dL
Hgb A1c MFr Bld: 5.6 % (ref 4.8–5.6)

## 2019-03-31 LAB — CBC
Hematocrit: 26 % — ABNORMAL LOW (ref 34.0–46.6)
Hemoglobin: 8.2 g/dL — ABNORMAL LOW (ref 11.1–15.9)
MCH: 29.8 pg (ref 26.6–33.0)
MCHC: 31.5 g/dL (ref 31.5–35.7)
MCV: 95 fL (ref 79–97)
Platelets: 510 10*3/uL — ABNORMAL HIGH (ref 150–450)
RBC: 2.75 x10E6/uL — ABNORMAL LOW (ref 3.77–5.28)
RDW: 12.6 % (ref 11.7–15.4)
WBC: 8.5 10*3/uL (ref 3.4–10.8)

## 2019-04-13 ENCOUNTER — Other Ambulatory Visit: Payer: Self-pay | Admitting: Urology

## 2019-04-16 ENCOUNTER — Telehealth: Payer: Self-pay

## 2019-04-16 NOTE — Patient Instructions (Signed)

## 2019-04-16 NOTE — Telephone Encounter (Signed)

## 2019-04-20 ENCOUNTER — Other Ambulatory Visit: Payer: Self-pay

## 2019-04-20 ENCOUNTER — Other Ambulatory Visit (HOSPITAL_COMMUNITY)
Admission: RE | Admit: 2019-04-20 | Discharge: 2019-04-20 | Disposition: A | Discharge status: Home / Self Care | Payer: 59 | Source: Ambulatory Visit | Attending: Internal Medicine | Admitting: Internal Medicine

## 2019-04-20 ENCOUNTER — Encounter: Payer: Self-pay | Admitting: Internal Medicine

## 2019-04-20 ENCOUNTER — Ambulatory Visit (INDEPENDENT_AMBULATORY_CARE_PROVIDER_SITE_OTHER): Payer: 59 | Admitting: Internal Medicine

## 2019-04-20 VITALS — BP 140/84 | HR 83 | Temp 97.7°F | Resp 17 | Wt 242.0 lb

## 2019-04-20 DIAGNOSIS — Z1151 Encounter for screening for human papillomavirus (HPV): Secondary | ICD-10-CM | POA: Diagnosis not present

## 2019-04-20 DIAGNOSIS — Z124 Encounter for screening for malignant neoplasm of cervix: Secondary | ICD-10-CM

## 2019-04-20 DIAGNOSIS — Z780 Asymptomatic menopausal state: Secondary | ICD-10-CM | POA: Insufficient documentation

## 2019-04-20 MED ORDER — TRIAMCINOLONE ACETONIDE 0.1 % EX CREA: 1.0000 "application " | TOPICAL_CREAM | Freq: Two times a day (BID) | CUTANEOUS | 0 refills | Status: AC

## 2019-04-20 NOTE — Progress Notes (Signed)
  Subjective:    Hannah Beard - 64 y.o. female MRN [id number]  Date of birth: September 05, 1955  HPI  Hannah Beard is here for PAP. Denies concerns with vaginal region. Prior PAP was at least 5 years ago. Reports no h/o abnormal PAP. Declines STD testing.      Health Maintenance:  Health Maintenance Due  Topic Date Due  . PAP SMEAR-Modifier  Never done  . MAMMOGRAM  Never done    -  reports that she quit smoking about 9 years ago. Her smoking use included cigarettes. She quit after 20.00 years of use. She has never used smokeless tobacco. - Review of Systems: Per HPI. - Past Medical History: Patient Active Problem List   Diagnosis Date Noted  . Chronic diastolic CHF (congestive heart failure) ([place]) 03/07/2014  . ST elevation myocardial infarction (STEMI) of inferior wall ([place]) 02/22/2014  . Former smoker   . CKD (chronic kidney disease)   . HLD (hyperlipidemia)   . CAD (coronary artery disease)   . Bradycardia 02/21/2014  . Acute kidney injury ([place]) 02/21/2014  . Acute diastolic CHF (congestive heart failure) ([place]) 02/21/2014  . CAD (coronary artery disease), native coronary artery 02/21/2014   - Medications: reviewed and updated   Objective:   Physical Exam BP 140/84   Pulse 83   Temp 97.7 F (36.5 C) (Temporal)   Resp 17   Wt 242 lb (109.8 kg)   SpO2 95%   BMI 41.54 kg/m  Physical Exam  Constitutional: She is oriented to person, place, and time and well-developed, well-nourished, and in no distress. No distress.  Cardiovascular: Normal rate.  Pulmonary/Chest: Effort normal. No respiratory distress.  Genitourinary:    Genitourinary Comments: GU/GYN: Exam performed in the presence of a chaperone. External genitalia within normal limits.  Vaginal mucosa pink, moist, normal rugae.  Nonfriable cervix without lesions, no discharge or bleeding noted on speculum exam.  Bimanual exam revealed normal, nongravid uterus.  No cervical motion tenderness. No adnexal masses  bilaterally.     Musculoskeletal:        General: Normal range of motion.  Neurological: She is alert and oriented to person, place, and time.  Skin: Skin is warm and dry. She is not diaphoretic.  Psychiatric: Affect and judgment normal.           Assessment & Plan:   1. Pap smear for cervical cancer screening - Cytology - PAP([place])     Phill Myron, D.O. 04/20/2019, 10:10 AM Primary Care at [facility]

## 2019-04-21 ENCOUNTER — Ambulatory Visit
Admission: RE | Admit: 2019-04-21 | Discharge: 2019-04-21 | Disposition: A | Discharge status: Home / Self Care | Payer: 59 | Source: Ambulatory Visit | Attending: Internal Medicine | Admitting: Internal Medicine

## 2019-04-21 DIAGNOSIS — Z1231 Encounter for screening mammogram for malignant neoplasm of breast: Secondary | ICD-10-CM

## 2019-04-21 LAB — CYTOLOGY - PAP
Comment: NEGATIVE
Diagnosis: NEGATIVE
High risk HPV: NEGATIVE

## 2019-04-21 IMAGING — MG DIGITAL SCREENING BILAT W/ TOMO W/ CAD
8 series · 8 of 24 positions shown · non-contrast
Comparison: No previous exams for comparison.

CLINICAL DATA: Screening.

EXAM:
DIGITAL SCREENING BILATERAL MAMMOGRAM WITH TOMO AND CAD

[L CC synth-2D]
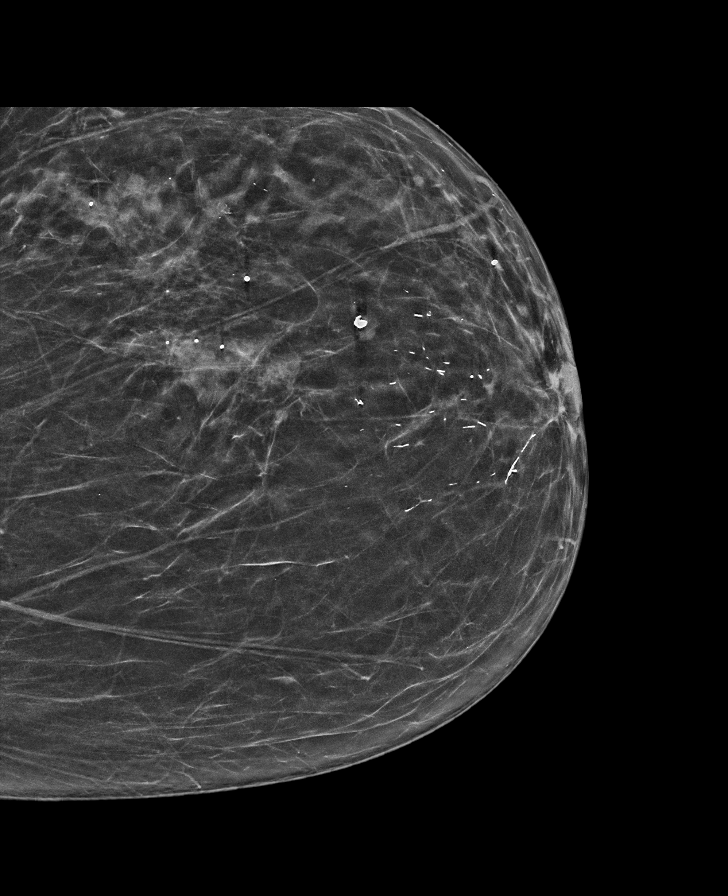

[R CC synth-2D]
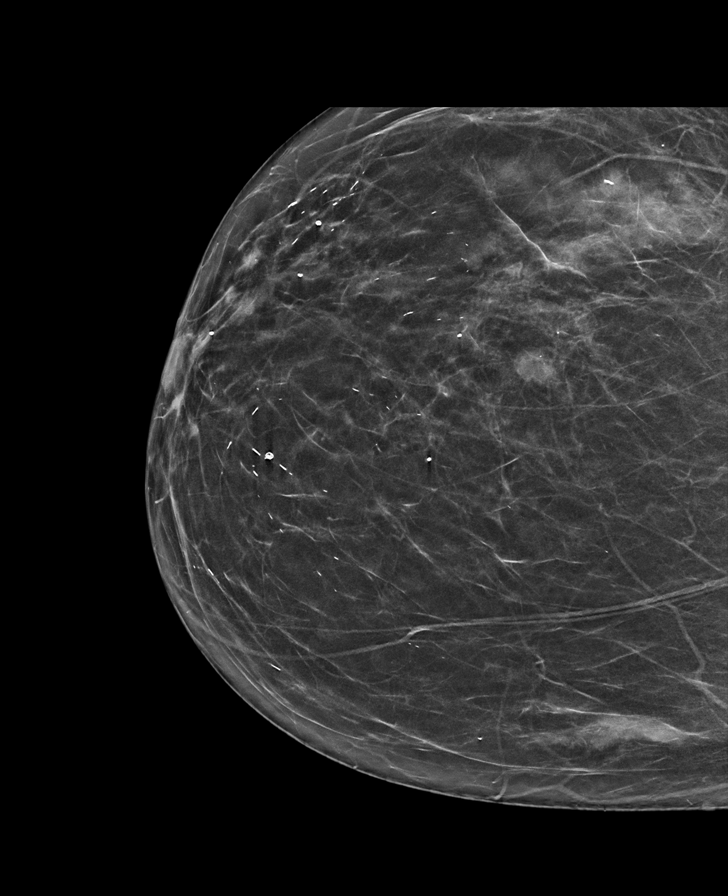

[L MLO synth-2D]
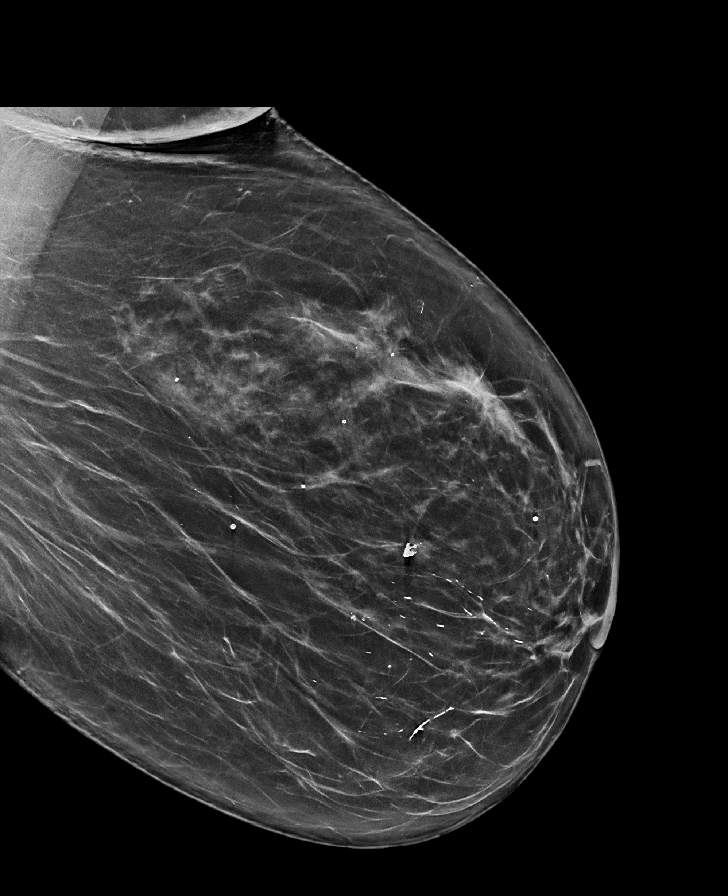

[R MLO synth-2D]
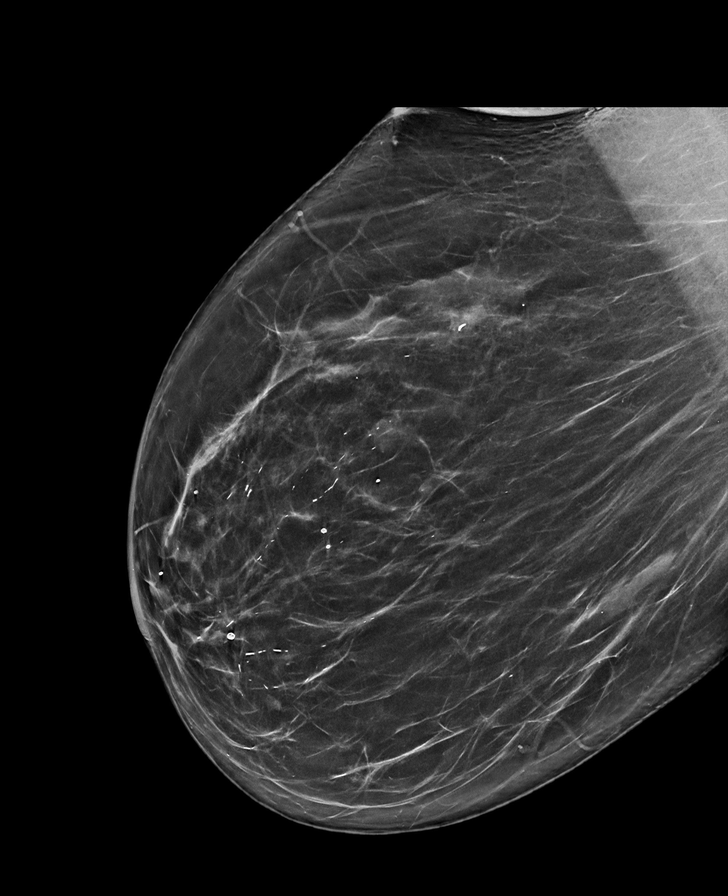

[L CC tomo · tomo slice 32/63.0]
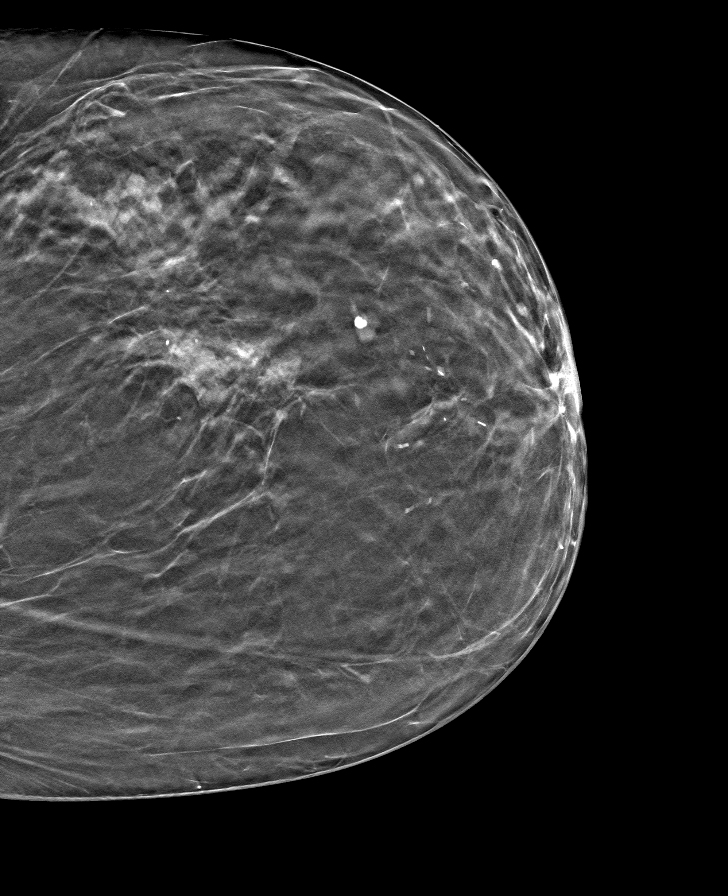

[L MLO tomo · tomo slice 43/84.0]
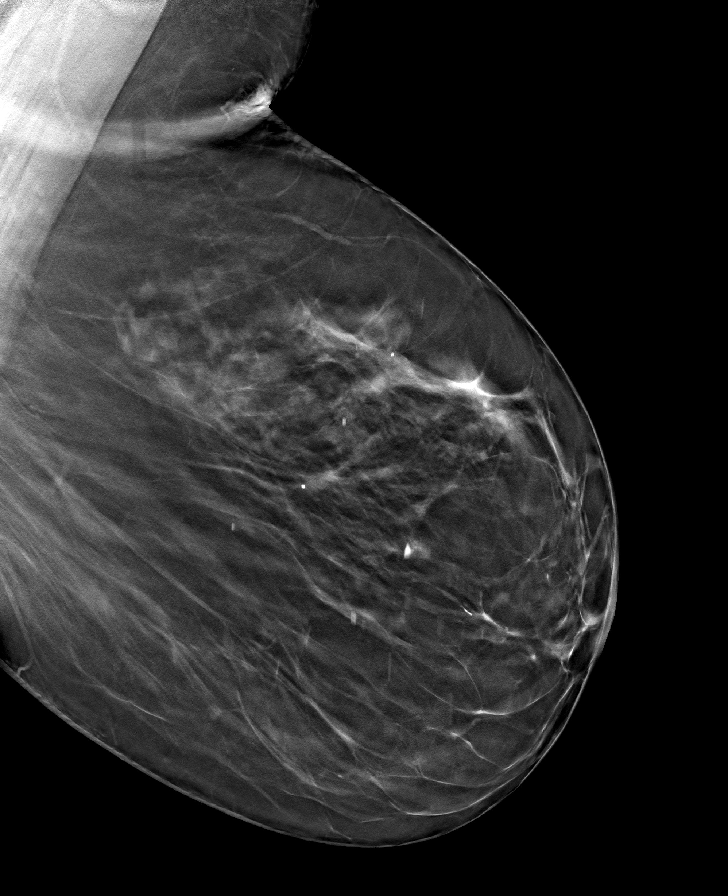

[R MLO tomo · tomo slice 41/81.0]
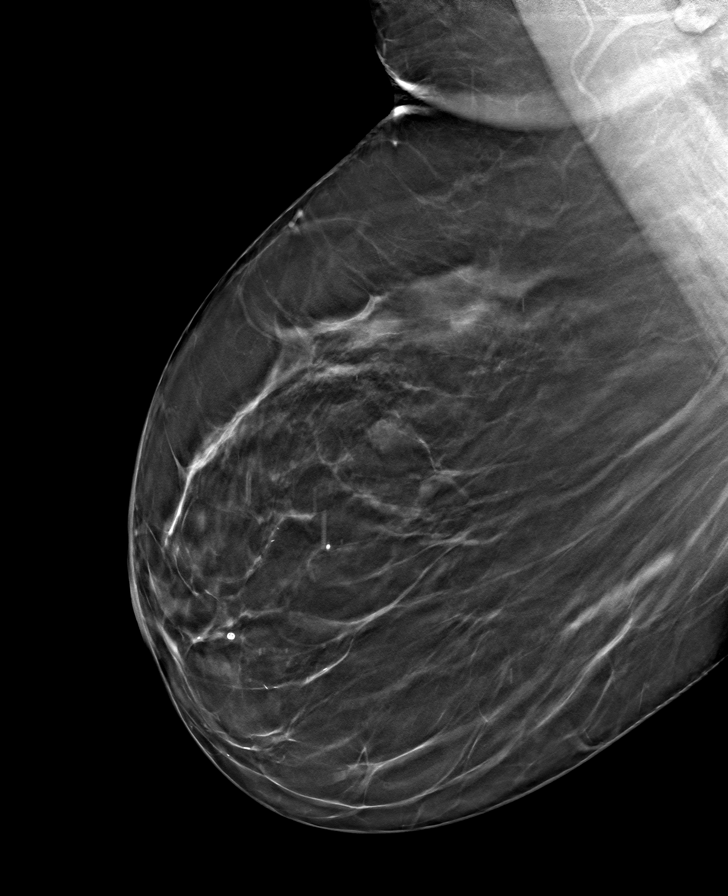

[R CC tomo · tomo slice 31/62.0]
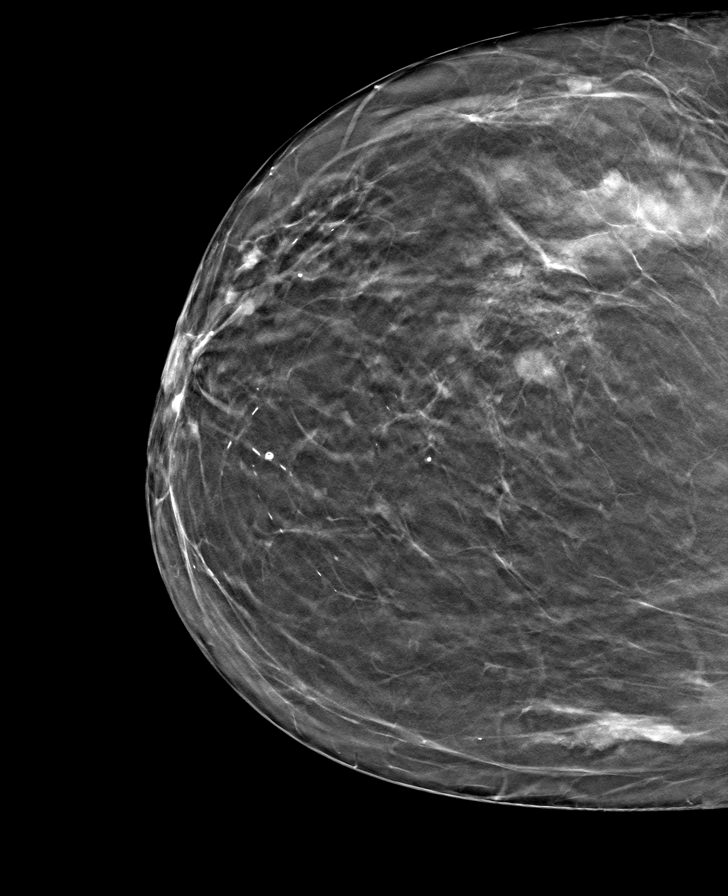

[8 of 24 positions shown; findings below may reference images not displayed]

ACR Breast Density Category b: There are scattered areas of
fibroglandular density.
FINDINGS: In the right breast in asymmetry and possible mass require further
evaluation.

In the left breast asymmetry requires further evaluation.

Images were processed with CAD.
IMPRESSION: Further evaluation is suggested for possible asymmetry and mass in
the right breast.

Further evaluation is suggested for possible asymmetry in the left
breast.

RECOMMENDATION:
Diagnostic mammogram and possibly ultrasound of both breasts.
(Code:[K9])

The patient will be contacted regarding the findings, and additional
imaging will be scheduled.

BI-RADS CATEGORY  0: Incomplete. Need additional imaging evaluation
and/or prior mammograms for comparison.

## 2019-04-21 NOTE — Progress Notes (Signed)
Normal pap letter mailed

## 2019-04-22 ENCOUNTER — Encounter (HOSPITAL_BASED_OUTPATIENT_CLINIC_OR_DEPARTMENT_OTHER): Payer: Self-pay | Admitting: Urology

## 2019-04-22 ENCOUNTER — Other Ambulatory Visit: Payer: Self-pay

## 2019-04-22 NOTE — Progress Notes (Addendum)
ADDENDUM:  Chart reviewed by anesthesia, Konrad Felix PA, ok to proceed barring any acute status change.   Spoke w/ via phone for pre-op interview--- PT Lab needs dos---- no              Lab results------ current ekg in epic/ chart COVID test ------ 04-25-2019 @ 1205 Arrive at ------- 0915 NPO after ------ MN Medications to take morning of surgery ----- NONE Diabetic medication ----- n/a Patient Special Instructions ----- n/a Pre-Op special Istructions ----- n/a Patient verbalized understanding of instructions that were given at this phone interview. Patient denies shortness of breath, chest pain, fever, cough a this phone interview.   Anesthesia Review:  Hx CAD s/p PCI with DES x1 02-20-2004 with STEMI post heart failure with preserved ef.  Pt states has not seen any cardiologist  in 5 yrs, followed by pcp.  Pt denies any cardiac s&s, sob, peripheral swelling. Chart to be reviewed by Konrad Felix PA.  PCP:  Dr Phill Myron Cardiologist :  Previously  seen by Dr Burt Knack, [place] 04-28-2014 in epic, no visit since with any cardiologist Chest x-ray : 02-20-2014 epic EKG :  03-18-2019 epic Echo :  02-20-2014  epic Cardiac Cath :   02-19-2014 epic Sleep Study/ CPAP :  NO Fasting Blood Sugar :      / Checks Blood Sugar -- times a day:   N/A Blood Thinner/ Instructions Maryjane Hurter Dose:  NO ASA / Instructions/ Last Dose :  NO

## 2019-04-23 ENCOUNTER — Other Ambulatory Visit: Payer: Self-pay | Admitting: Internal Medicine

## 2019-04-23 DIAGNOSIS — R928 Other abnormal and inconclusive findings on diagnostic imaging of breast: Secondary | ICD-10-CM

## 2019-04-25 ENCOUNTER — Other Ambulatory Visit (HOSPITAL_COMMUNITY)
Admission: RE | Admit: 2019-04-25 | Discharge: 2019-04-25 | Disposition: A | Discharge status: Home / Self Care | Payer: 59 | Source: Ambulatory Visit | Attending: Urology | Admitting: Urology

## 2019-04-25 DIAGNOSIS — Z20822 Contact with and (suspected) exposure to covid-19: Secondary | ICD-10-CM | POA: Diagnosis not present

## 2019-04-25 DIAGNOSIS — Z01812 Encounter for preprocedural laboratory examination: Secondary | ICD-10-CM | POA: Insufficient documentation

## 2019-04-25 LAB — SARS CORONAVIRUS 2 (TAT 6-24 HRS): SARS Coronavirus 2: NEGATIVE

## 2019-04-29 ENCOUNTER — Encounter (HOSPITAL_BASED_OUTPATIENT_CLINIC_OR_DEPARTMENT_OTHER): Payer: Self-pay | Admitting: Urology

## 2019-04-29 ENCOUNTER — Ambulatory Visit (HOSPITAL_BASED_OUTPATIENT_CLINIC_OR_DEPARTMENT_OTHER)
Admission: RE | Admit: 2019-04-29 | Discharge: 2019-04-29 | Disposition: A | Discharge status: Home / Self Care | Payer: 59 | Attending: Urology | Admitting: Urology

## 2019-04-29 ENCOUNTER — Ambulatory Visit (HOSPITAL_BASED_OUTPATIENT_CLINIC_OR_DEPARTMENT_OTHER): Payer: 59 | Admitting: Physician Assistant

## 2019-04-29 ENCOUNTER — Encounter (HOSPITAL_BASED_OUTPATIENT_CLINIC_OR_DEPARTMENT_OTHER)
Admission: RE | Disposition: A | Discharge status: Home / Self Care | Payer: Self-pay | Source: Home / Self Care | Attending: Urology

## 2019-04-29 DIAGNOSIS — I252 Old myocardial infarction: Secondary | ICD-10-CM | POA: Insufficient documentation

## 2019-04-29 DIAGNOSIS — I130 Hypertensive heart and chronic kidney disease with heart failure and stage 1 through stage 4 chronic kidney disease, or unspecified chronic kidney disease: Secondary | ICD-10-CM | POA: Diagnosis not present

## 2019-04-29 DIAGNOSIS — Z483 Aftercare following surgery for neoplasm: Secondary | ICD-10-CM | POA: Diagnosis not present

## 2019-04-29 DIAGNOSIS — I2510 Atherosclerotic heart disease of native coronary artery without angina pectoris: Secondary | ICD-10-CM | POA: Insufficient documentation

## 2019-04-29 DIAGNOSIS — Z87891 Personal history of nicotine dependence: Secondary | ICD-10-CM | POA: Insufficient documentation

## 2019-04-29 DIAGNOSIS — N189 Chronic kidney disease, unspecified: Secondary | ICD-10-CM | POA: Diagnosis not present

## 2019-04-29 DIAGNOSIS — C679 Malignant neoplasm of bladder, unspecified: Secondary | ICD-10-CM | POA: Insufficient documentation

## 2019-04-29 DIAGNOSIS — I509 Heart failure, unspecified: Secondary | ICD-10-CM | POA: Insufficient documentation

## 2019-04-29 DIAGNOSIS — N1330 Unspecified hydronephrosis: Secondary | ICD-10-CM | POA: Insufficient documentation

## 2019-04-29 DIAGNOSIS — Z955 Presence of coronary angioplasty implant and graft: Secondary | ICD-10-CM | POA: Insufficient documentation

## 2019-04-29 DIAGNOSIS — Z6841 Body Mass Index (BMI) 40.0 and over, adult: Secondary | ICD-10-CM | POA: Insufficient documentation

## 2019-04-29 HISTORY — DX: Dermatitis, unspecified: L30.9

## 2019-04-29 HISTORY — PX: CYSTOSCOPY WITH BIOPSY: SHX5122

## 2019-04-29 SURGERY — CYSTOSCOPY, WITH BIOPSY
Anesthesia: General | Site: Bladder

## 2019-04-29 MED ORDER — CEPHALEXIN 500 MG PO CAPS: 500.0000 mg | ORAL_CAPSULE | Freq: Two times a day (BID) | ORAL | 0 refills | Status: AC

## 2019-04-29 MED ORDER — LACTATED RINGERS IV SOLN
INTRAVENOUS | Status: DC
  Filled 2019-04-29: qty 1000

## 2019-04-29 MED ORDER — PROPOFOL 10 MG/ML IV BOLUS
INTRAVENOUS | Status: DC | PRN
  Administered 2019-04-29: 40 mg via INTRAVENOUS
  Administered 2019-04-29: 100 mg via INTRAVENOUS

## 2019-04-29 MED ORDER — DEXAMETHASONE SODIUM PHOSPHATE 10 MG/ML IJ SOLN
INTRAMUSCULAR | Status: DC | PRN
  Administered 2019-04-29: 10 mg via INTRAVENOUS

## 2019-04-29 MED ORDER — FENTANYL CITRATE (PF) 100 MCG/2ML IJ SOLN
INTRAMUSCULAR | Status: AC
  Filled 2019-04-29: qty 2

## 2019-04-29 MED ORDER — ACETAMINOPHEN 500 MG PO TABS
1000.0000 mg | ORAL_TABLET | Freq: Once | ORAL | Status: AC
  Administered 2019-04-29: 1000 mg via ORAL
  Filled 2019-04-29: qty 2

## 2019-04-29 MED ORDER — PROPOFOL 10 MG/ML IV BOLUS
INTRAVENOUS | Status: AC
  Filled 2019-04-29: qty 20

## 2019-04-29 MED ORDER — FENTANYL CITRATE (PF) 100 MCG/2ML IJ SOLN
25.0000 ug | INTRAMUSCULAR | Status: DC | PRN
  Filled 2019-04-29: qty 1

## 2019-04-29 MED ORDER — ACETAMINOPHEN 500 MG PO TABS
ORAL_TABLET | ORAL | Status: AC
  Filled 2019-04-29: qty 2

## 2019-04-29 MED ORDER — MIDAZOLAM HCL 2 MG/2ML IJ SOLN
INTRAMUSCULAR | Status: AC
  Filled 2019-04-29: qty 2

## 2019-04-29 MED ORDER — ONDANSETRON HCL 4 MG/2ML IJ SOLN
4.0000 mg | Freq: Once | INTRAMUSCULAR | Status: DC | PRN
  Filled 2019-04-29: qty 2

## 2019-04-29 MED ORDER — LIDOCAINE 2% (20 MG/ML) 5 ML SYRINGE
INTRAMUSCULAR | Status: DC | PRN
  Administered 2019-04-29: 100 mg via INTRAVENOUS

## 2019-04-29 MED ORDER — LIDOCAINE 2% (20 MG/ML) 5 ML SYRINGE
INTRAMUSCULAR | Status: AC
  Filled 2019-04-29: qty 5

## 2019-04-29 MED ORDER — ONDANSETRON HCL 4 MG/2ML IJ SOLN
INTRAMUSCULAR | Status: AC
  Filled 2019-04-29: qty 2

## 2019-04-29 MED ORDER — ONDANSETRON HCL 4 MG/2ML IJ SOLN
INTRAMUSCULAR | Status: DC | PRN
  Administered 2019-04-29: 4 mg via INTRAVENOUS

## 2019-04-29 MED ORDER — STERILE WATER FOR IRRIGATION IR SOLN
Status: DC | PRN
  Administered 2019-04-29: 6000 mL via INTRAVESICAL

## 2019-04-29 MED ORDER — IOHEXOL 300 MG/ML  SOLN
INTRAMUSCULAR | Status: DC | PRN
  Administered 2019-04-29: 17 mL via URETHRAL

## 2019-04-29 MED ORDER — CEFAZOLIN SODIUM-DEXTROSE 2-4 GM/100ML-% IV SOLN
INTRAVENOUS | Status: AC
  Filled 2019-04-29: qty 100

## 2019-04-29 MED ORDER — MIDAZOLAM HCL 5 MG/5ML IJ SOLN
INTRAMUSCULAR | Status: DC | PRN
  Administered 2019-04-29: 2 mg via INTRAVENOUS

## 2019-04-29 MED ORDER — FENTANYL CITRATE (PF) 100 MCG/2ML IJ SOLN
INTRAMUSCULAR | Status: DC | PRN
  Administered 2019-04-29 (×3): 25 ug via INTRAVENOUS
  Administered 2019-04-29: 50 ug via INTRAVENOUS
  Administered 2019-04-29: 25 ug via INTRAVENOUS
  Administered 2019-04-29: 50 ug via INTRAVENOUS

## 2019-04-29 MED ORDER — KETOROLAC TROMETHAMINE 15 MG/ML IJ SOLN
15.0000 mg | Freq: Once | INTRAMUSCULAR | Status: DC | PRN
  Filled 2019-04-29: qty 1

## 2019-04-29 MED ORDER — CEFAZOLIN SODIUM-DEXTROSE 2-4 GM/100ML-% IV SOLN
2.0000 g | Freq: Once | INTRAVENOUS | Status: AC
  Administered 2019-04-29: 11:00:00 2 g via INTRAVENOUS
  Filled 2019-04-29: qty 100

## 2019-04-29 MED ORDER — DEXAMETHASONE SODIUM PHOSPHATE 10 MG/ML IJ SOLN
INTRAMUSCULAR | Status: AC
  Filled 2019-04-29: qty 1

## 2019-04-29 SURGICAL SUPPLY — 20 items
BAG DRAIN URO-CYSTO SKYTR STRL (DRAIN) ×3 IMPLANT
CATH ROBINSON RED A/P 14FR (CATHETERS) IMPLANT
CATH URET 5FR 28IN OPEN ENDED (CATHETERS) ×3 IMPLANT
CLOTH BEACON ORANGE TIMEOUT ST (SAFETY) ×3 IMPLANT
ELECT REM PT RETURN 9FT ADLT (ELECTROSURGICAL) ×3
ELECTRODE REM PT RTRN 9FT ADLT (ELECTROSURGICAL) ×1 IMPLANT
GLOVE BIO SURGEON STRL SZ7.5 (GLOVE) ×3 IMPLANT
GOWN STRL REUS W/ TWL XL LVL3 (GOWN DISPOSABLE) ×1 IMPLANT
GOWN STRL REUS W/TWL XL LVL3 (GOWN DISPOSABLE) ×2
GUIDEWIRE ZIPWRE .038 STRAIGHT (WIRE) ×3 IMPLANT
KIT TURNOVER CYSTO (KITS) ×3 IMPLANT
MANIFOLD NEPTUNE II (INSTRUMENTS) ×3 IMPLANT
NDL SAFETY ECLIPSE 18X1.5 (NEEDLE) IMPLANT
NEEDLE HYPO 18GX1.5 SHARP (NEEDLE)
PACK CYSTO (CUSTOM PROCEDURE TRAY) ×3 IMPLANT
STENT URET 6FRX24 CONTOUR (STENTS) ×3 IMPLANT
SYR 20ML LL LF (SYRINGE) ×3 IMPLANT
TUBE CONNECTING 12'X1/4 (SUCTIONS) ×1
TUBE CONNECTING 12X1/4 (SUCTIONS) ×2 IMPLANT
WATER STERILE IRR 3000ML UROMA (IV SOLUTION) ×6 IMPLANT

## 2019-04-29 NOTE — Discharge Instructions (Signed)
Post Anesthesia Home Care Instructions  Activity: Get plenty of rest for the remainder of the day. A responsible individual must stay with you for 24 hours following the procedure.  For the next 24 hours, DO NOT: -Drive a car -Paediatric nurse -Drink alcoholic beverages -Take any medication unless instructed by your physician -Make any legal decisions or sign important papers.  Meals: Start with liquid foods such as gelatin or soup. Progress to regular foods as tolerated. Avoid greasy, spicy, heavy foods. If nausea and/or vomiting occur, drink only clear liquids until the nausea and/or vomiting subsides. Call your physician if vomiting continues.  Special Instructions/Symptoms: Your throat may feel dry or sore from the anesthesia or the breathing tube placed in your throat during surgery. If this causes discomfort, gargle with warm salt water. The discomfort should disappear within 24 hours.   Bladder Biopsy, Care After This sheet gives you information about how to care for yourself after your procedure. Your health care provider may also give you more specific instructions. If you have problems or questions, contact your health care provider. What can I expect after the procedure? After the procedure, it is common to have:  Mild pain in your bladder or kidney area during urination.  Minor burning during urination.  Small amounts of blood in your urine.  A sudden urge to urinate.  A need to urinate more often than usual. Follow these instructions at home: Medicines  Take over-the-counter and prescription medicines only as told by your health care provider.  If you were prescribed an antibiotic medicine, take it as told by your health care provider. Do not stop taking the antibiotic even if you start to feel better. Activity  Rest if told by your health care provider.  Do not drive for 24 hours if you received a medicine to help you relax (sedative) during your procedure.  Ask your health care provider when it is safe for you to drive.  Return to your normal activities as told by your health care provider. Ask your health care provider what activities are safe for you. General instructions   Take a warm bath to relieve any burning sensations around your urethra.  Hold a warm, damp washcloth over the urethral area to ease pain.  It is up to you to get the results of your procedure. Ask your health care provider, or the department that is doing the procedure, when your results will be ready.  Keep all follow-up visits as told by your health care provider. This is important. Contact a health care provider if:  You have a fever.  Your symptoms do not improve within 24 hours, and you continue to have: ? Burning during urination. ? Increasing amounts of blood in your urine. ? Pain during urination. ? An urgent need to urinate. ? A need to urinate more often than usual. Get help right away if:  You have a lot of bleeding or more bleeding.  You have severe pain.  You are unable to urinate.  You have bright red blood in your urine.  You are passing blood clots in your urine.  You have a fever. Summary  After the procedure, it is common to have mild pain, burning with urination, and some blood.  Take medicines as told. If you were given antibiotics, finish all of it even if you start to feel better.  Rest after the procedure. Follow your health care provider's instructions for self care at home.  Contact a health care provider  if your symptoms do not improve within 24 hours, or if you have more pain or more blood in your urine.  Get help right away if you have a lot of bleeding, severe pain, fever, or bright red blood or blood clots in the urine. This information is not intended to replace advice given to you by your health care provider. Make sure you discuss any questions you have with your health care provider. Document Revised: 07/09/2018  Document Reviewed: 07/09/2018 Elsevier Patient Education  [place].      Tylenol may be taken after 6:00pm today.

## 2019-04-29 NOTE — Transfer of Care (Signed)
Immediate Anesthesia Transfer of Care Note  Patient: Hannah Beard  Procedure(s) Performed: CYSTOSCOPY WITH BLADDER BIOPSY/ RIGHT STENT REMOVAL (N/A Bladder)  Patient Location: PACU  Anesthesia Type:General  Level of Consciousness: awake, alert  and oriented  Airway & Oxygen Therapy: Patient Spontanous Breathing and Patient connected to nasal cannula oxygen  Post-op Assessment: Report given to RN and Post -op Vital signs reviewed and stable  Post vital signs: Reviewed and stable  Last Vitals:  Vitals Value Taken Time  BP    Temp    Pulse    Resp    SpO2      Last Pain:  Vitals:   04/29/19 0938  TempSrc: Oral  PainSc: 0-No pain      Patients Stated Pain Goal: 5 ([id number])  Complications: No apparent anesthesia complications

## 2019-04-29 NOTE — Op Note (Addendum)
Operative Note  Preoperative diagnosis:  1.  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  High-grade T1 urothelial carcinoma the bladder 2.  Right hydroureteronephrosis  Procedure(s): 1.  Cystoscopy with bladder biopsy 2.  Right JJ stent exchange 3.  Right retrograde pyelogram with intraoperative interpretation of fluoroscopic imaging  Surgeon: Ellison Hughs, MD  Assistants:  None  Anesthesia:  General  Complications:  None  EBL: Less than 5 mL  Specimens: 1.  Bladder biopsies 2.  Previously placed right JJ stent was removed intact, inspected and discarded  Drains/Catheters: 1.  Right 6 French, 24 cm JJ stent without tether  Intraoperative findings:   1. No gross evidence of bladder tumor recurrence, but there was a large amount of fibrinous material surrounding her previous resection site that was directly involving the right ureteral orifice.  The right ureteral orifice appeared patent, but the surrounding urothelial mucosa was edematous.  There was no direct tumor involvement with the right ureteral orifice 2. Right retrograde pyelogram revealed a uniformly dilated right ureter and right renal pelvis with no intraluminal filling defects.  Indication:  Aeriel Beard is a 64 y.o. female with a history of high-grade T1 urothelial carcinoma the bladder, s/p TURBT with gemcitabine instillation and right JJ stent placement on 03/18/2019.  She is here today for a confirmatory biopsy and right JJ stent evaluation.  She has been consented for the above procedures, voices understanding and wishes to proceed.  Description of procedure:  After informed consent was obtained, the patient was brought to the operating room and general LMA anesthesia was administered. The patient was then placed in the dorsolithotomy position and prepped and draped in the usual sterile fashion. A timeout was performed. A 23 French rigid cystoscope was then inserted  into the urethral meatus and advanced into the bladder under direct vision. A complete bladder survey revealed the findings listed above.  There were no additional bladder tumors seen throughout the remainder of the bladder.  Biopsy forceps were then used to extensively biopsy her previous resection site, which was directly surrounding her right ureteral orifice.  Her right JJ stent was in place in the ureteral orifice showed no evidence of tumor extension.  Her right JJ stent was then grasped at its distal curl and retracted to the urethral meatus.  A Glidewire was then advanced the lumen of the stent and up to the right renal pelvis, under fluoroscopic guidance.  The stent was then removed over the wire intact, inspected and discarded.  A 5 French ureteral catheter was then advanced over the wire and placed into the distal aspects of the right ureter.  A right retrograde pyelogram was obtained, with the findings listed above.  At that point, given the persistent dilation of her right collecting system I decided to replace her right JJ stent.  A 6 French, 24 cm JJ stent was then placed over the wire and into good position within the right collecting system, confirming placement via fluoroscopy.  Her biopsy sites were then extensively fulgurated with electrocautery until hemostasis was achieved.  Her bladder was drained via the cystoscope sheath.  She tolerated the procedure well and was transferred to the postanesthesia in stable condition.  Plan: Follow-up in 2 weeks for stent removal and to discuss her biopsy results.

## 2019-04-29 NOTE — Anesthesia Postprocedure Evaluation (Signed)
Anesthesia Post Note  Patient: Hannah Beard  Procedure(s) Performed: CYSTOSCOPY WITH BLADDER BIOPSY/ RIGHT STENT REMOVAL (N/A Bladder)     Patient location during evaluation: PACU Anesthesia Type: General Level of consciousness: awake and alert Pain management: pain level controlled Vital Signs Assessment: post-procedure vital signs reviewed and stable Respiratory status: spontaneous breathing, nonlabored ventilation, respiratory function stable and patient connected to nasal cannula oxygen Cardiovascular status: blood pressure returned to baseline and stable Postop Assessment: no apparent nausea or vomiting Anesthetic complications: no    Last Vitals:  Vitals:   04/29/19 1230 04/29/19 1300  BP: (!) 173/88 (!) 171/82  Pulse: 86 88  Resp: 11 12  Temp:  36.6 C  SpO2: 92% 94%    Last Pain:  Vitals:   04/29/19 1300  TempSrc:   PainSc: 0-No pain                 Pascal Stiggers P [name]

## 2019-04-29 NOTE — H&P (Signed)
PRE-OP H&P  Office Visit Report     04/13/2019   --------------------------------------------------------------------------------   Hannah Beard  MRN: [id number]  DOB: Oct 08, 1955, 64 year old Female  PROVIDER:  Ellison Hughs, M.D.  LOCATION:  [facility] [phone]     --------------------------------------------------------------------------------   CC/HPI: CC: Bladder cancer   HPI: Hannah Beard is a 64 year old female with a 2 year history of intermittent episodes of painless gross hematuria. PMHx of tobacco use, CAD, STEMI (s/p DES in 2016), HTN, HLD, CKD and obesity. 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   04/13/19: The patient is here today for a routine follow-up. She has done well following surgery and has no urinary complaints. Denies interval UTIs, dysuria or hematuria.     ALLERGIES: None   MEDICATIONS: Tamsulosin Hcl 0.4 mg capsule 1 capsule PO Daily  Vitamin C  Vitamin D3     GU PSH: Bladder Instill AntiCA Agent - 03/18/2019 Cystoscopy Insert Stent, Right - 03/18/2019 Cystoscopy TURBT >5 cm - 03/18/2019 Locm 300-399Mg /Ml Iodine,1Ml - 03/13/2019       PSH Notes: chest tubes for pneumonia    NON-GU PSH: Bilateral Tubal Ligation Insert/place Heart Cath     GU PMH: Gross hematuria - 02/27/2019 Urinary Hesitancy - 02/27/2019    NON-GU PMH: Myocardial Infarction    FAMILY HISTORY: Heart Disease - Runs in Family liver cancer - Grandmother Lung Cancer - Mother   SOCIAL HISTORY: Marital Status: Divorced Preferred Language: English; Race: White Current Smoking Status: Patient does not smoke anymore.   Tobacco Use Assessment Completed: Used Tobacco in last 30 days? Drinks 2 drinks per month.  Drinks 3 caffeinated drinks per day.    REVIEW OF SYSTEMS:    GU Review Female:   Patient denies frequent urination, hard to postpone urination, burning  /pain with urination, get up at night to urinate, leakage of urine, stream starts and stops, trouble starting your stream, have to strain to urinate, and being pregnant.  Gastrointestinal (Upper):   Patient denies nausea, vomiting, and indigestion/ heartburn.  Gastrointestinal (Lower):   Patient denies diarrhea and constipation.  Constitutional:   Patient denies fever, night sweats, weight loss, and fatigue.  Skin:   Patient denies skin rash/ lesion and itching.  Eyes:   Patient denies blurred vision and double vision.  Ears/ Nose/ Throat:   Patient denies sore throat and sinus problems.  Hematologic/Lymphatic:   Patient denies swollen glands and easy bruising.  Cardiovascular:   Patient denies leg swelling and chest pains.  Respiratory:   Patient denies cough and shortness of breath.  Endocrine:   Patient denies excessive thirst.  Musculoskeletal:   Patient denies back pain and joint pain.  Neurological:   Patient denies headaches and dizziness.  Psychologic:   Patient denies depression and anxiety.   VITAL SIGNS:      04/13/2019 08:39 AM  Weight 246 lb / 111.58 kg  Height 64.5 in / 163.83 cm  BP 170/80 mmHg  Heart Rate 99 /min  Temperature 97.8 F / 36.5 C  BMI 41.6 kg/m   MULTI-SYSTEM PHYSICAL EXAMINATION:    Constitutional: Well-nourished. No physical deformities. Normally developed. Good grooming.  Neurologic / Psychiatric: Oriented to time, oriented to place, oriented to person. No depression, no anxiety, no agitation.  Musculoskeletal: Normal gait and station of head and neck.     PAST DATA REVIEWED:  Source Of History:  Patient  Lab Test Review:   Path Report  Notes:                     SURGICAL PATHOLOGY SURGICAL PATHOLOGY  CASE: [id number]  PATIENT: Hannah Beard  Surgical Pathology Report      Clinical History: bladder mass      FINAL MICROSCOPIC DIAGNOSIS:   A. BLADDER, TUMOR SUPERFICIAL MARGINS, RESECTION:  High grade papillary urothelial carcinoma   The carcinoma invades laminar propria   B. BLADDER, TUMOR DEEP MARGINS, RESECTION:  Muscularis propria (detrusor muscle) is present and not involved    GROSS DESCRIPTION:   A.  Received in formalin  11 x 9.5 x 3 cm aggregate of friable tan-pink  soft tissue and gelatinous red-brown blood.  Representative sections are  submitted in 10 cassettes.   B.  Received in formalin is a 3 x 3 x 0.5 cm aggregate of friable  tan-pink and red-brown soft tissue, submitted in 2 cassettes.  (AK  03/19/2019)     Final Diagnosis performed by Casimer Lanius, MD.   Electronically signed  03/20/2019  Technical and / or Professional components performed at [facility], [place] [address], [place], [place] [address].   Immunohistochemistry Technical component (if applicable) was performed  at [facility]. [address] [place],  [place], [place] [address].   IMMUNOHISTOCHEMISTRY DISCLAIMER (if applicable):  Some of these immunohistochemical stains may have been developed and the  performance characteristics determine by [facility]. Some  may not have been cleared or approved by the U.S. Food and Drug  Administration. The FDA has determined that such clearance or approval  is not necessary. This test is used for clinical purposes. It should not  be regarded as investigational or for research. This laboratory is  certified under the [place]  (CLIA-88) as qualified to perform high complexity clinical laboratory  testing.  The controls stained appropriately.      PROCEDURES:          Urinalysis w/Scope Dipstick Dipstick Cont'd Micro  Color: Yellow Bilirubin: Neg mg/dL WBC/hpf: 40 - 60/hpf  Appearance: Clear Ketones: Neg mg/dL RBC/hpf: >60/hpf  Specific Gravity: 1.015 Blood: 3+ ery/uL Bacteria: Many (>50/hpf)  pH: 5.5 Protein: 1+ mg/dL Cystals: NS (Not Seen)  Glucose: Neg mg/dL Urobilinogen: 0.2 mg/dL Casts: NS  (Not Seen)    Nitrites: Neg Trichomonas: Not Present    Leukocyte Esterase: 3+ leu/uL Mucous: Present      Epithelial Cells: 6 - 10/hpf      Yeast: NS (Not Seen)      Sperm: Not Present    ASSESSMENT:      ICD-10 Details  1 GU:   Bladder Cancer overlapping sites - C67.8 Undiagnosed New Problem - High Grade T1 UCC  2   Gross hematuria - R31.0    PLAN:           Orders Labs Urine Culture          Schedule Return Visit/Planned Activity: Next Available Appointment - Schedule Surgery          Document Letter(s):  Created for Patient: Clinical Summary         Notes:   -Pathology results discussed with the patient  -Urine C&S pending. Will call in abx PRN  -The risks, benefits and alternatives of repeat cystoscopy with TURBT and right JJ stent removal was discussed with the patient. The risks include, but are not limited  to, bleeding, urinary tract infection, bladder perforation requiring prolonged catheterization and/or open bladder repair, ureteral obstruction, voiding dysfunction and the inherent risks of general anesthesia. The patient voices understanding and wishes to proceed.

## 2019-04-29 NOTE — Anesthesia Procedure Notes (Signed)
Procedure Name: LMA Insertion Date/Time: 04/29/2019 11:08 AM Performed by: Amalia Edgecombe D, CRNA Pre-anesthesia Checklist: Patient identified, Emergency Drugs available, Suction available and Patient being monitored Patient Re-evaluated:Patient Re-evaluated prior to induction Oxygen Delivery Method: Circle system utilized Preoxygenation: Pre-oxygenation with 100% oxygen Induction Type: IV induction Ventilation: Mask ventilation without difficulty LMA: LMA inserted LMA Size: 4.0 Tube type: Oral Number of attempts: 1 Placement Confirmation: positive ETCO2 and breath sounds checked- equal and bilateral Tube secured with: Tape Dental Injury: Teeth and Oropharynx as per pre-operative assessment

## 2019-04-29 NOTE — Anesthesia Preprocedure Evaluation (Addendum)
Anesthesia Evaluation  Patient identified by MRN, date of birth, ID band Patient awake    Reviewed: Allergy & Precautions, NPO status , Patient's Chart, lab work & pertinent test results  Airway Mallampati: II  TM Distance: >3 FB Neck ROM: Full    Dental  (+) Chipped, Poor Dentition   Pulmonary former smoker,    Pulmonary exam normal breath sounds clear to auscultation       Cardiovascular hypertension, + CAD, + Past MI, + Cardiac Stents and +CHF  Normal cardiovascular exam Rhythm:Regular Rate:Normal  ECG: ST, rate 107   Neuro/Psych negative neurological ROS  negative psych ROS   GI/Hepatic negative GI ROS, Neg liver ROS,   Endo/Other  Morbid obesity  Renal/GU Renal disease     Musculoskeletal negative musculoskeletal ROS (+)   Abdominal (+) + obese,   Peds  Hematology HLD   Anesthesia Other Findings BLADDER CANCER  Reproductive/Obstetrics                            Anesthesia Physical Anesthesia Plan  ASA: III  Anesthesia Plan: General   Post-op Pain Management:    Induction: Intravenous  PONV Risk Score and Plan: 4 or greater and Ondansetron, Dexamethasone, Midazolam and Treatment may vary due to age or medical condition  Airway Management Planned: LMA  Additional Equipment:   Intra-op Plan:   Post-operative Plan: Extubation in OR  Informed Consent: I have reviewed the patients History and Physical, chart, labs and discussed the procedure including the risks, benefits and alternatives for the proposed anesthesia with the patient or authorized representative who has indicated his/her understanding and acceptance.     Dental advisory given  Plan Discussed with: CRNA  Anesthesia Plan Comments:        Anesthesia Quick Evaluation

## 2019-04-30 LAB — SURGICAL PATHOLOGY

## 2019-05-12 ENCOUNTER — Ambulatory Visit
Admission: RE | Admit: 2019-05-12 | Discharge: 2019-05-12 | Disposition: A | Discharge status: Home / Self Care | Payer: 59 | Source: Ambulatory Visit | Attending: Internal Medicine | Admitting: Internal Medicine

## 2019-05-12 ENCOUNTER — Other Ambulatory Visit: Payer: Self-pay

## 2019-05-12 ENCOUNTER — Other Ambulatory Visit: Payer: Self-pay | Admitting: Internal Medicine

## 2019-05-12 DIAGNOSIS — R928 Other abnormal and inconclusive findings on diagnostic imaging of breast: Secondary | ICD-10-CM

## 2019-05-12 DIAGNOSIS — N6310 Unspecified lump in the right breast, unspecified quadrant: Secondary | ICD-10-CM

## 2019-05-12 IMAGING — MG DIGITAL DIAGNOSTIC BILAT W/ TOMO W/ CAD
6 of 10 series · 6 of 30 positions shown · non-contrast
Comparison: Previous exam(s).

CLINICAL DATA: 64-year-old female presenting as a recall from
screening for bilateral breast abnormalities. Patient's last
mammogram was over 10 years ago.

EXAM:
DIGITAL DIAGNOSTIC BILATERAL MAMMOGRAM WITH TOMO
ULTRASOUND BILATERAL BREAST

[R CC synth-2D (1 of 2)]
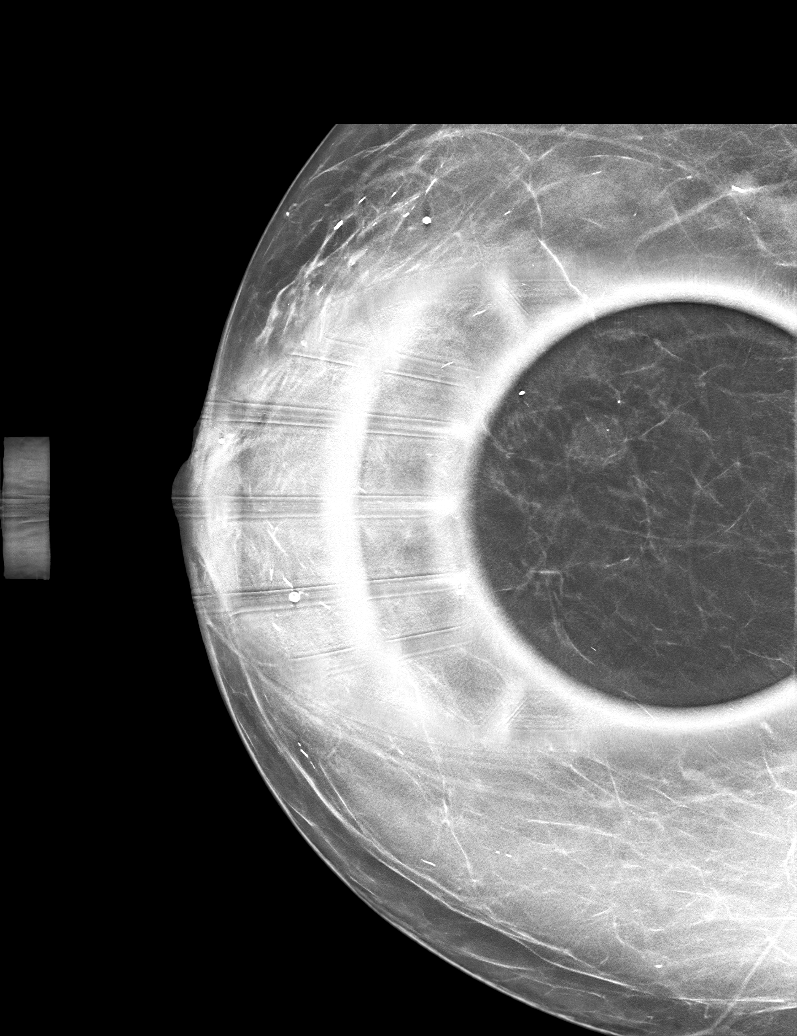

[L ML synth-2D]
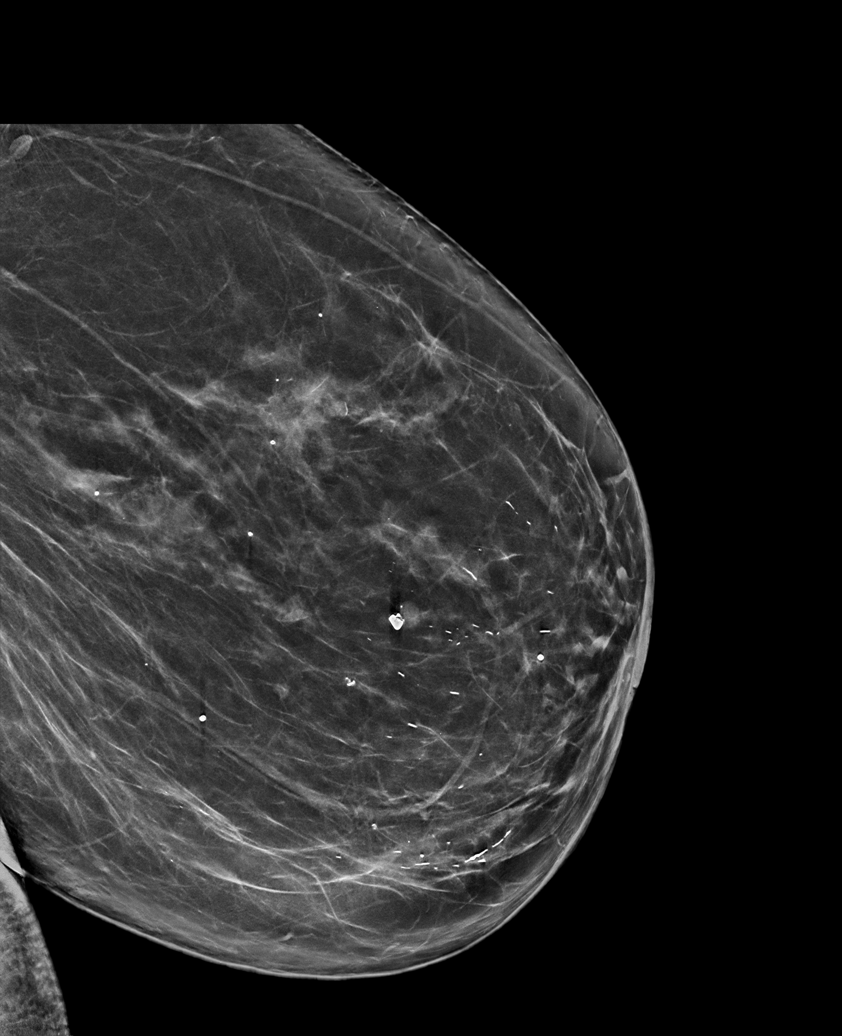

[R CC synth-2D (2 of 2)]
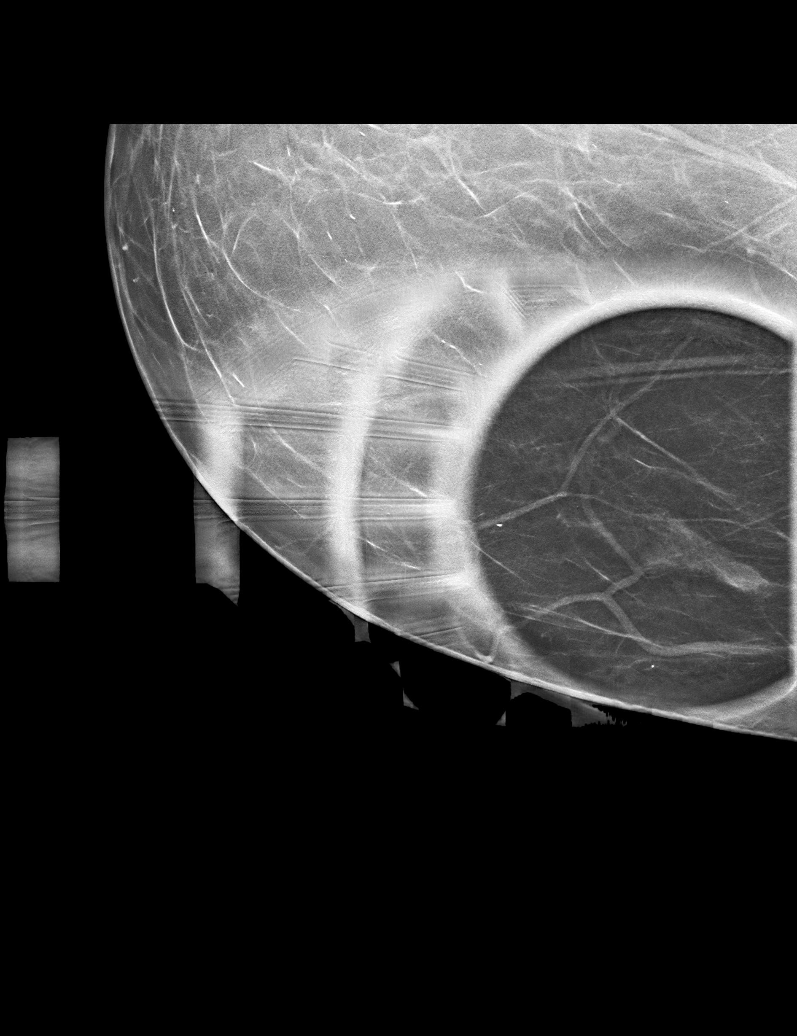

[L CC synth-2D]
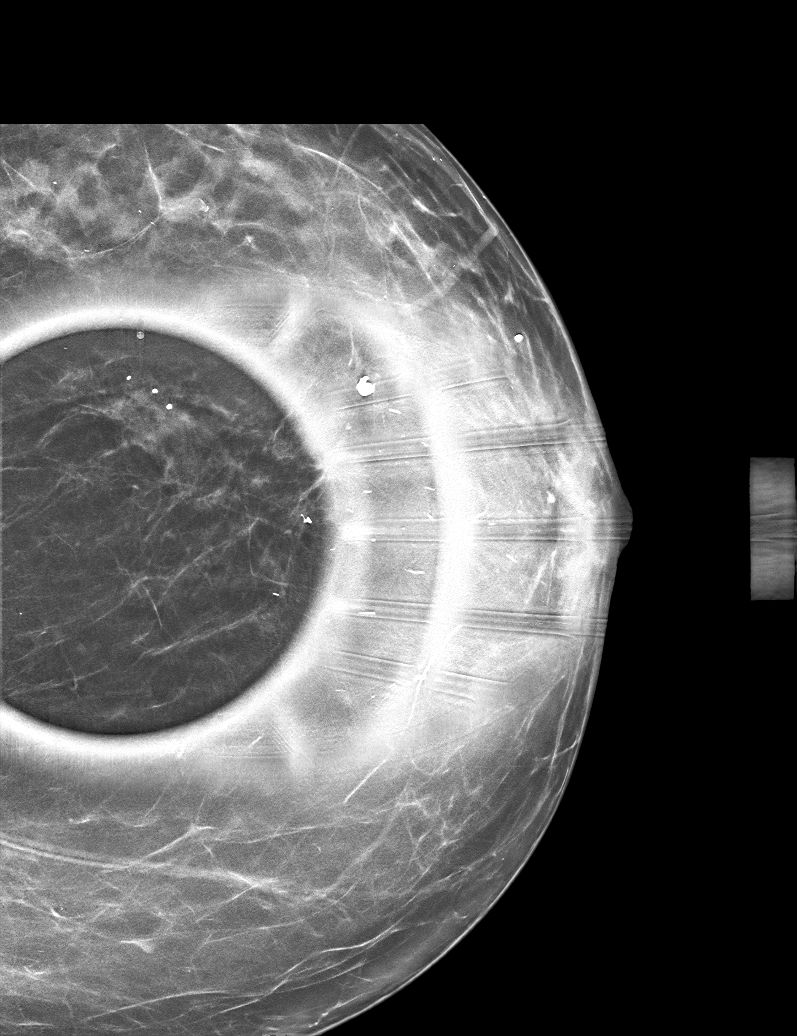

[R ML synth-2D]
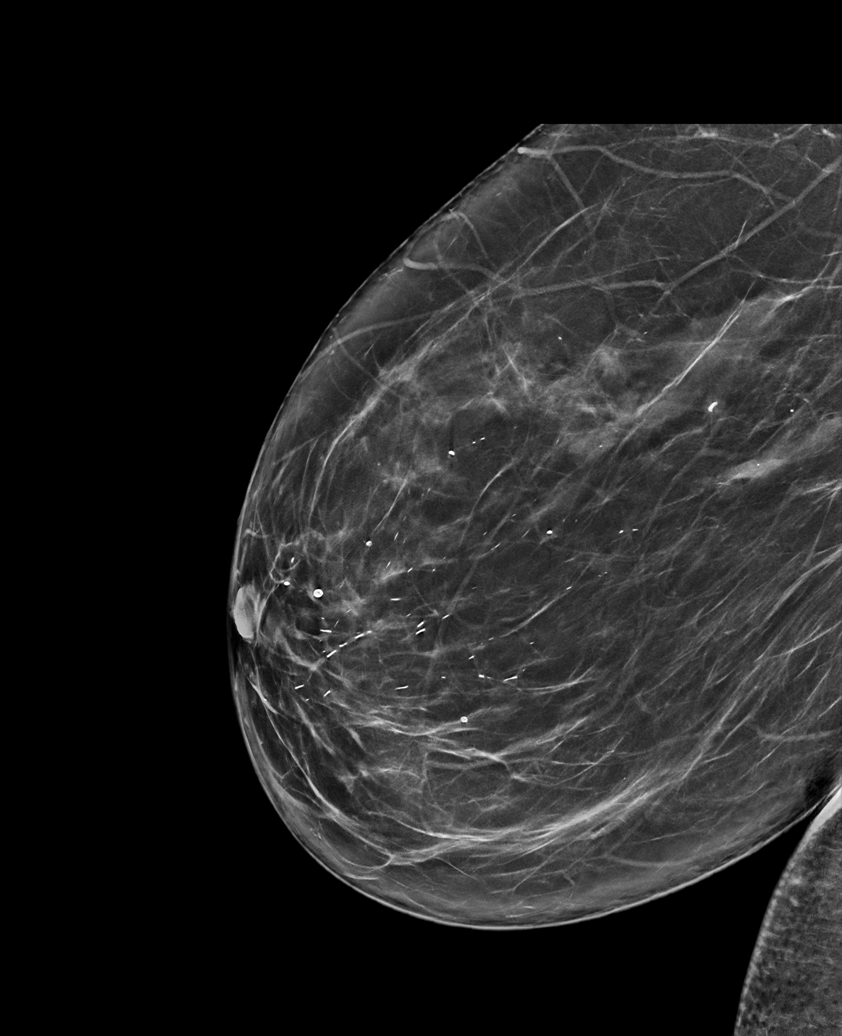

[R ML tomo · tomo slice 45/89.0]
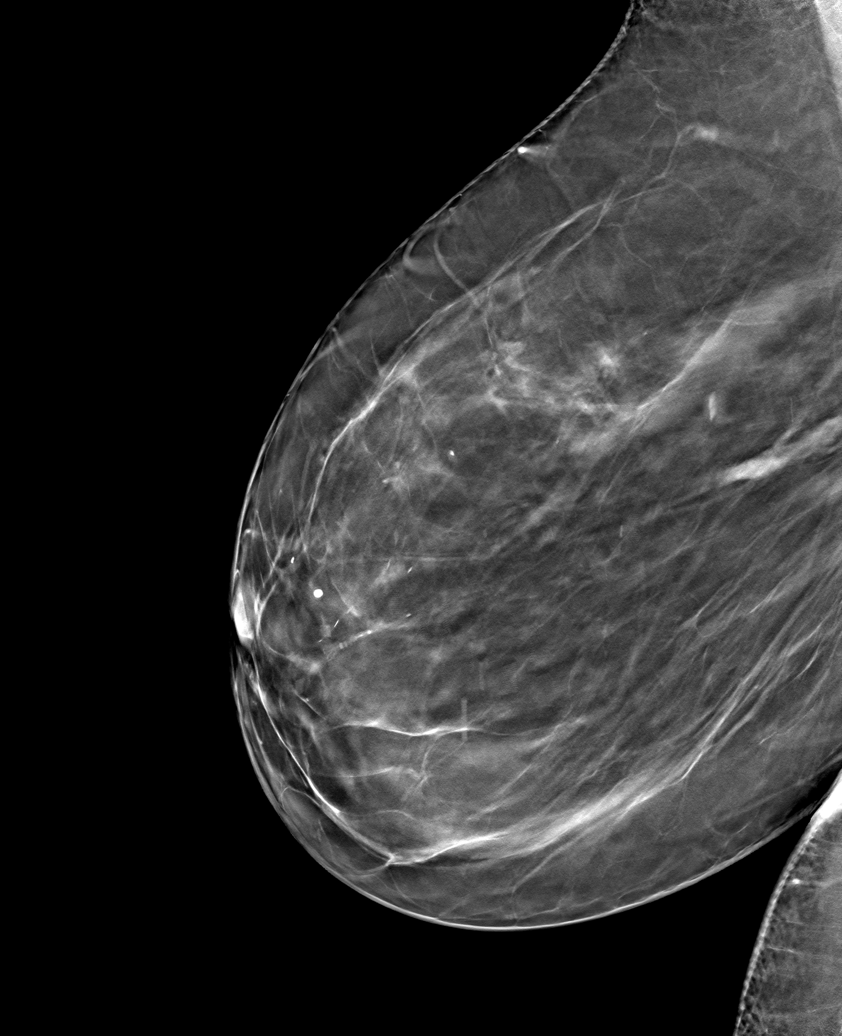

[6 of 30 positions shown; findings below may reference images not displayed]

ACR Breast Density Category b: There are scattered areas of
fibroglandular density.
FINDINGS: Mammogram:

Right breast: Full field and spot compression tomosynthesis views of
the right breast were performed. On the additional imaging there is
persistence of an oval mass in the upper central right breast
measuring approximately 1.1 cm. There is also a tubular mass in the
far posteromedial aspect of the breast measuring approximately
cm.

Left breast: Full field and spot compression tomosynthesis views of
the left breast performed for the questioned asymmetry in the upper
slightly inner aspect of the left breast. On the additional imaging
the tissue in this area disperses without persistent mass or
distortion.

Ultrasound:

Targeted ultrasound is performed in the right breast at 12 o'clock 3
cm from the nipple demonstrating an irregular hypoechoic mass
measuring 1.1 x 0.5 x 1.1 cm. No internal blood flow identified.
This corresponds to the finding identified mammographically.

Targeted ultrasound in the right breast at 2 o'clock 14 cm from the
nipple demonstrates an oval elongated mass with indistinct margins
measuring 2.6 x 0.5 x 0.9 cm. No internal blood flow identified.
This corresponds to the mammographic finding.

Targeted ultrasound of the right axilla demonstrates normal
appearing lymph nodes.

Targeted ultrasound of the upper-outer quadrant the left breast
demonstrates no suspicious cystic or solid mass.
IMPRESSION: 1. Right breast mass at 12 o'clock measuring 1.1 cm is
indeterminate.

2. Right breast mass at 2 o'clock measuring 2.6 cm is also
indeterminate.

3. No mammographic or sonographic evidence of malignancy in the
upper-outer quadrant of the left breast.

RECOMMENDATION:
Ultrasound-guided core needle biopsies of the right breast masses at
12 o'clock and 2 o'clock. Patient indicated she is awaiting results
and treatment planning for bladder cancer and so would like to call
back at the end of the week to schedule the breast biopsies.

I have discussed the findings and recommendations with the patient.
If applicable, a reminder letter will be sent to the patient
regarding the next appointment.

BI-RADS CATEGORY  4: Suspicious.

## 2019-05-12 IMAGING — US US BREAST*L* LIMITED INC AXILLA
1 series · 3 of 3 positions shown · non-contrast
Comparison: Previous exam(s).

CLINICAL DATA: 64-year-old female presenting as a recall from
screening for bilateral breast abnormalities. Patient's last
mammogram was over 10 years ago.

EXAM:
DIGITAL DIAGNOSTIC BILATERAL MAMMOGRAM WITH TOMO
ULTRASOUND BILATERAL BREAST

[Series 1: us breast*left* limited inc axilla · 0.09mm/px · 3 of 3 slices shown]
[im 1/3]
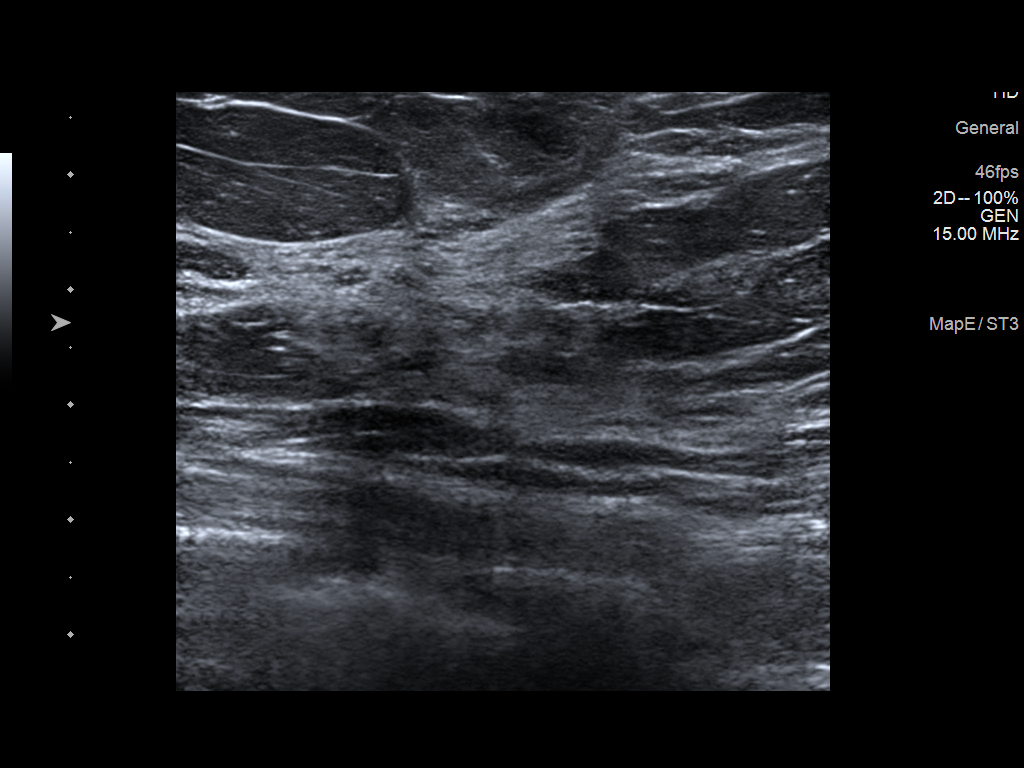
[im 2/3]
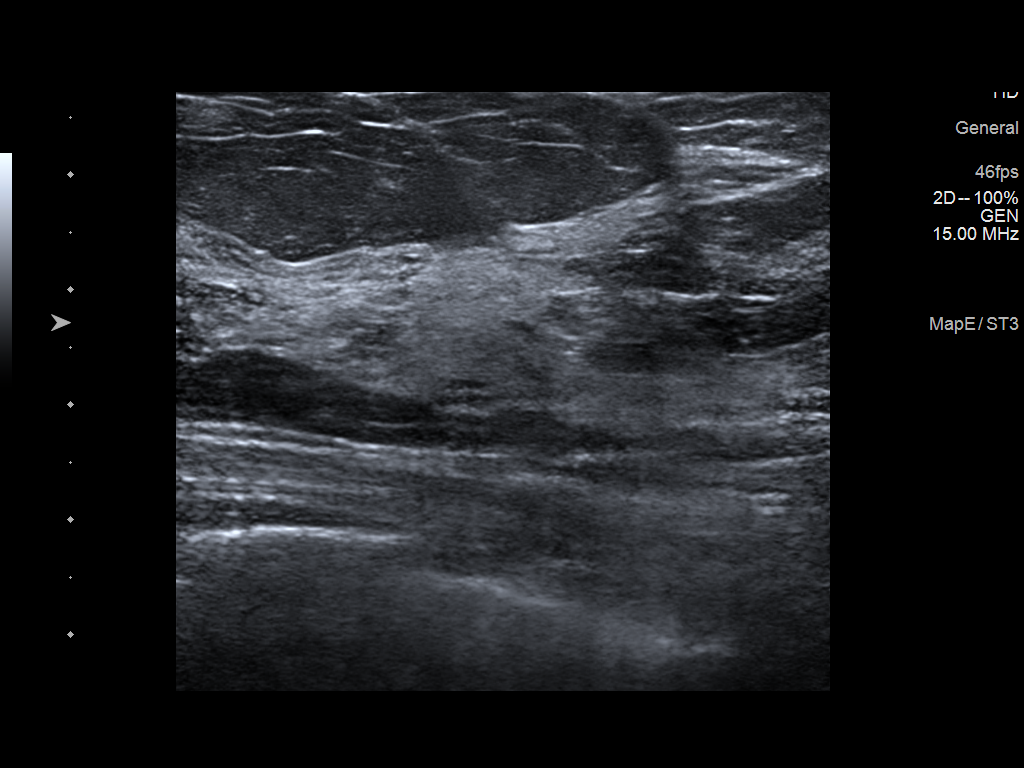
[im 3/3]
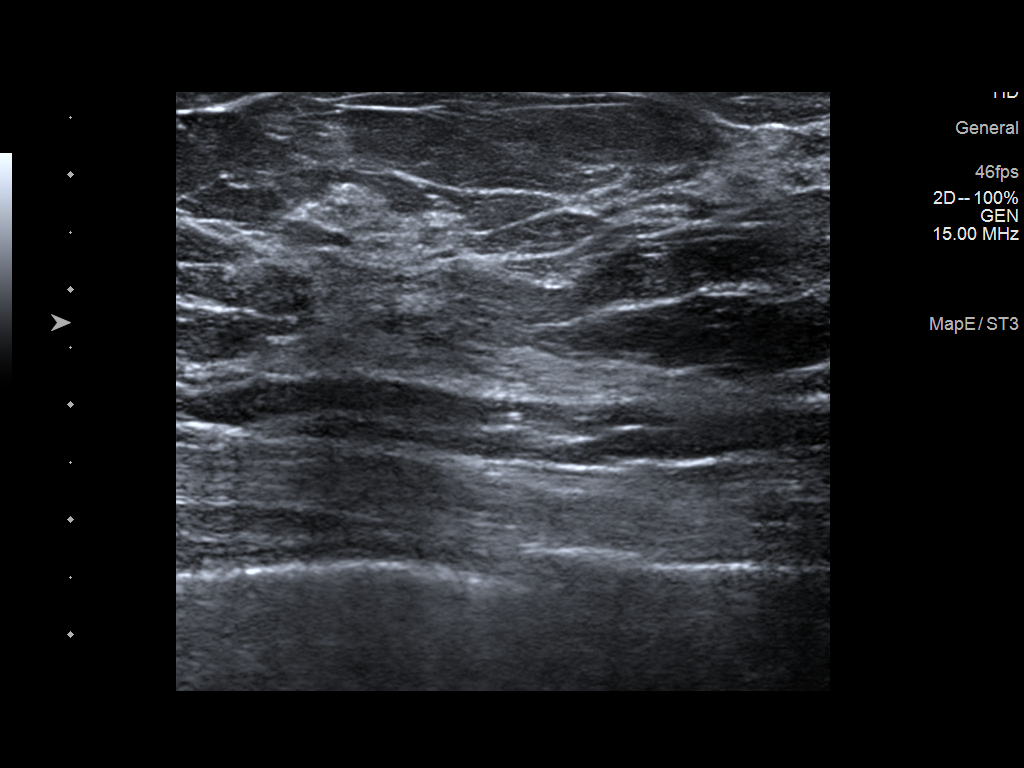

[3 of 3 positions shown; findings below may reference images not displayed]

ACR Breast Density Category b: There are scattered areas of
fibroglandular density.
FINDINGS: Mammogram:

Right breast: Full field and spot compression tomosynthesis views of
the right breast were performed. On the additional imaging there is
persistence of an oval mass in the upper central right breast
measuring approximately 1.1 cm. There is also a tubular mass in the
far posteromedial aspect of the breast measuring approximately
cm.

Left breast: Full field and spot compression tomosynthesis views of
the left breast performed for the questioned asymmetry in the upper
slightly inner aspect of the left breast. On the additional imaging
the tissue in this area disperses without persistent mass or
distortion.

Ultrasound:

Targeted ultrasound is performed in the right breast at 12 o'clock 3
cm from the nipple demonstrating an irregular hypoechoic mass
measuring 1.1 x 0.5 x 1.1 cm. No internal blood flow identified.
This corresponds to the finding identified mammographically.

Targeted ultrasound in the right breast at 2 o'clock 14 cm from the
nipple demonstrates an oval elongated mass with indistinct margins
measuring 2.6 x 0.5 x 0.9 cm. No internal blood flow identified.
This corresponds to the mammographic finding.

Targeted ultrasound of the right axilla demonstrates normal
appearing lymph nodes.

Targeted ultrasound of the upper-outer quadrant the left breast
demonstrates no suspicious cystic or solid mass.
IMPRESSION: 1. Right breast mass at 12 o'clock measuring 1.1 cm is
indeterminate.

2. Right breast mass at 2 o'clock measuring 2.6 cm is also
indeterminate.

3. No mammographic or sonographic evidence of malignancy in the
upper-outer quadrant of the left breast.

RECOMMENDATION:
Ultrasound-guided core needle biopsies of the right breast masses at
12 o'clock and 2 o'clock. Patient indicated she is awaiting results
and treatment planning for bladder cancer and so would like to call
back at the end of the week to schedule the breast biopsies.

I have discussed the findings and recommendations with the patient.
If applicable, a reminder letter will be sent to the patient
regarding the next appointment.

BI-RADS CATEGORY  4: Suspicious.

## 2019-05-12 IMAGING — US US BREAST*R* LIMITED INC AXILLA
1 series · 13 of 15 positions shown · non-contrast
Comparison: Previous exam(s).

CLINICAL DATA: 64-year-old female presenting as a recall from
screening for bilateral breast abnormalities. Patient's last
mammogram was over 10 years ago.

EXAM:
DIGITAL DIAGNOSTIC BILATERAL MAMMOGRAM WITH TOMO
ULTRASOUND BILATERAL BREAST

[Series 1: us breast*right* limited inc axilla · 0.07mm/px · 13 of 15 slices shown]
[im 1/15]
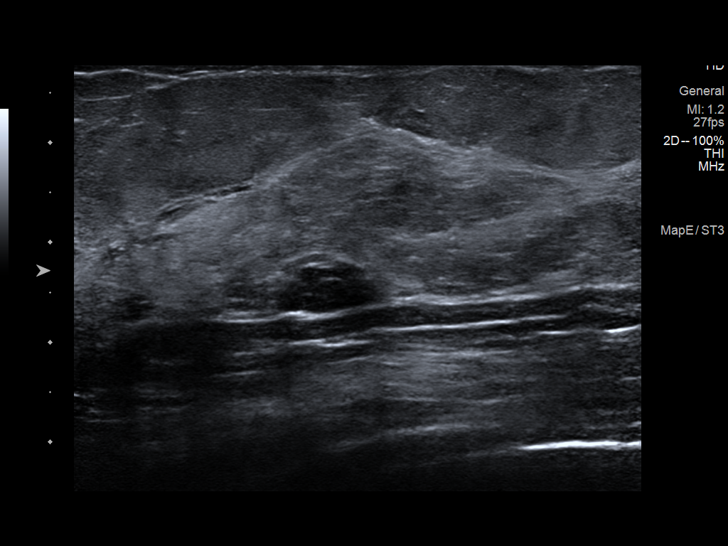
[im 2/15]
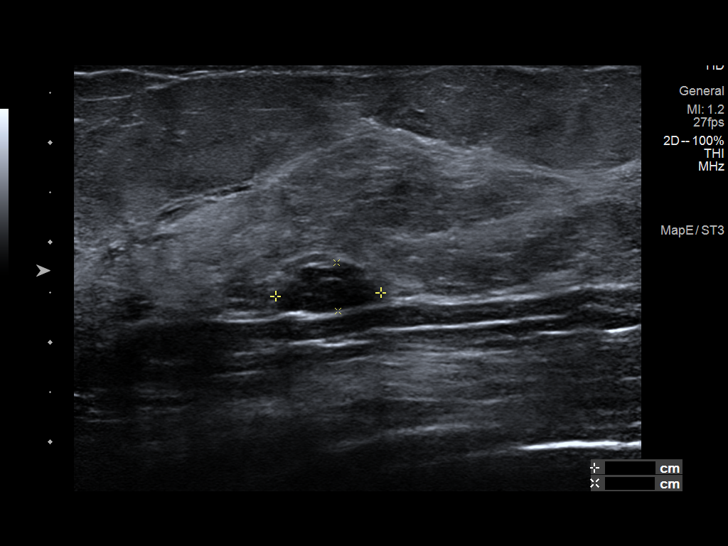
[im 3/15]
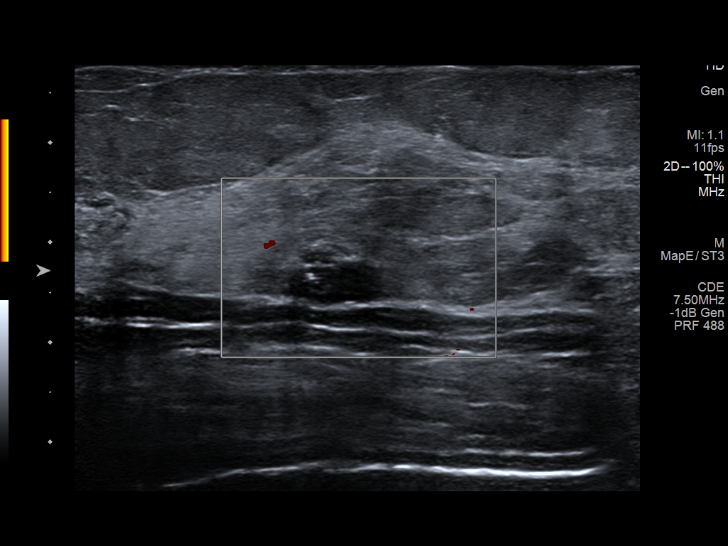
[im 5/15]
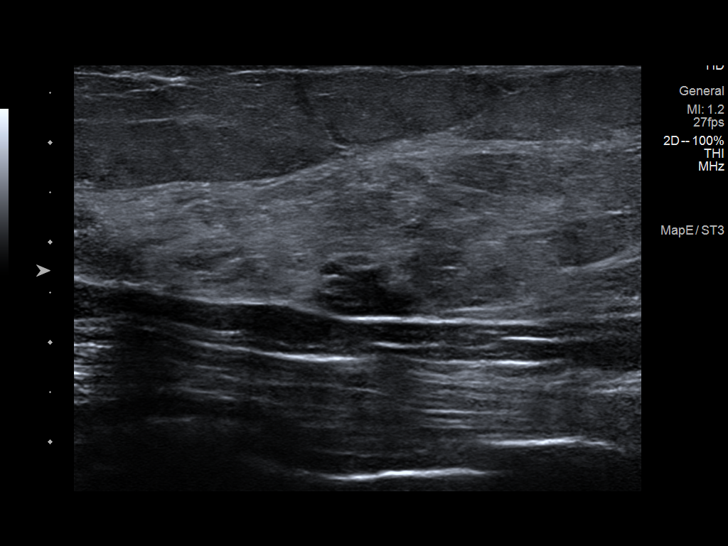
[im 6/15]
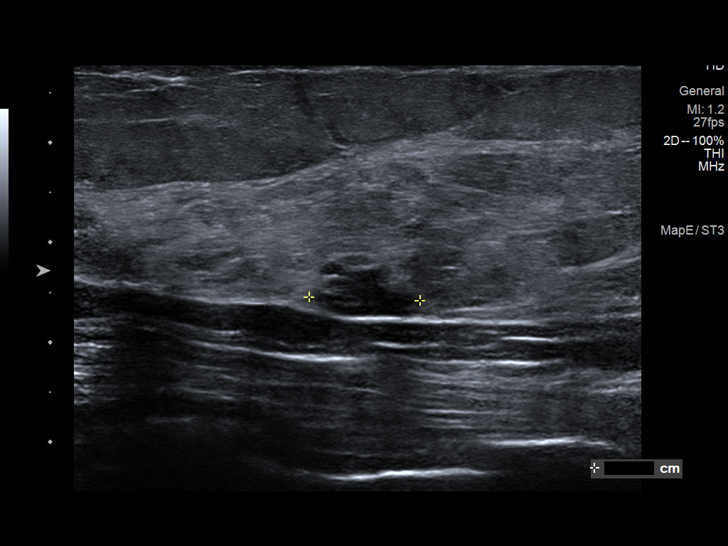
[im 7/15]
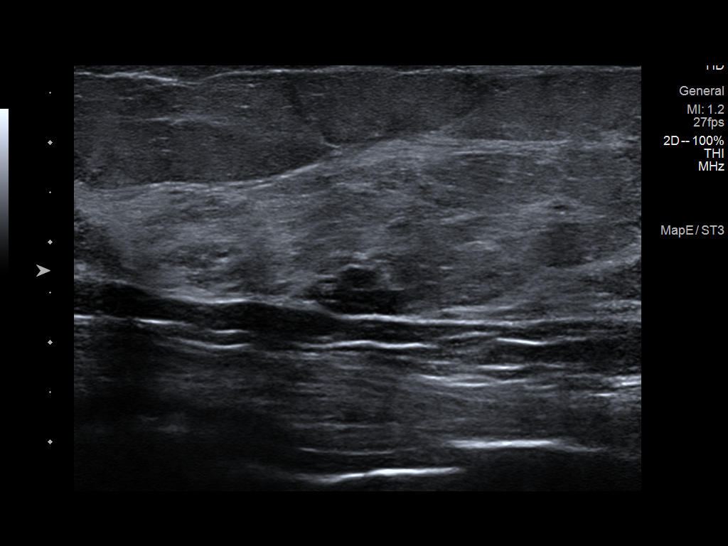
[im 8/15]
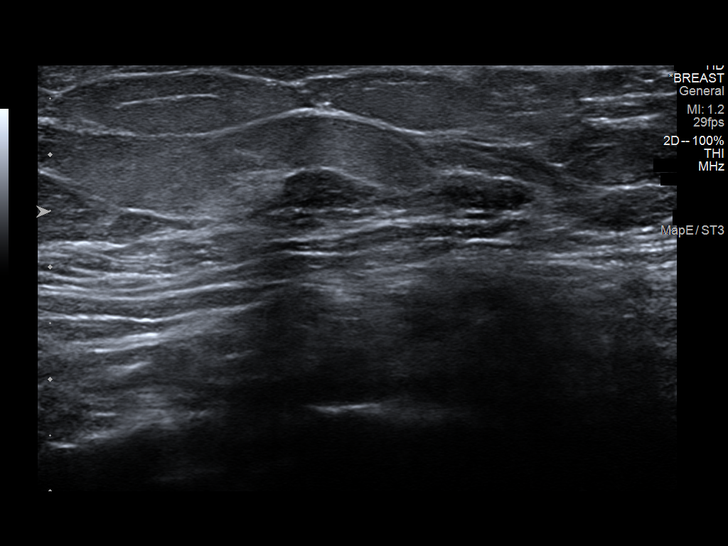
[im 9/15]
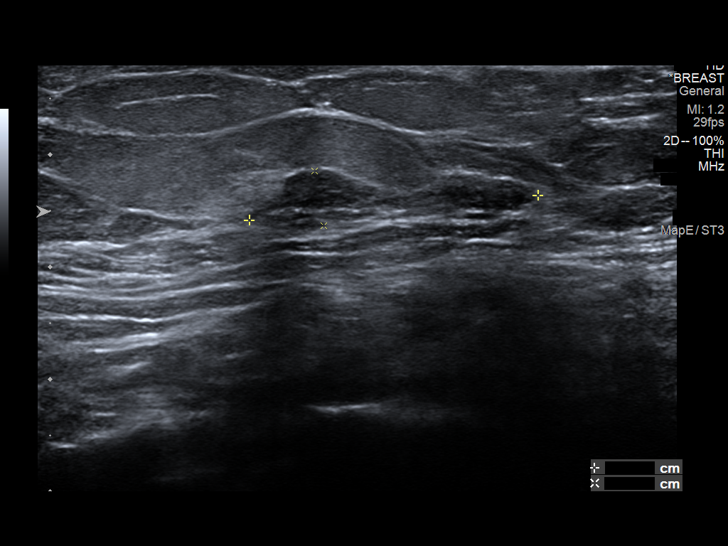
[im 10/15]
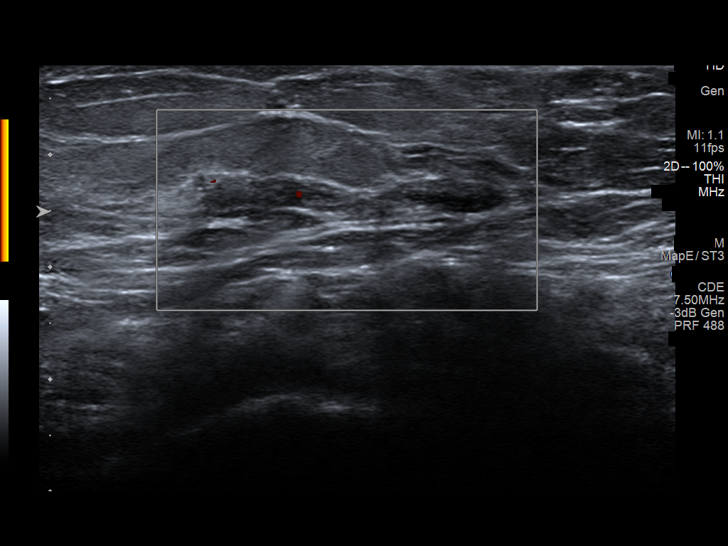
[im 11/15]
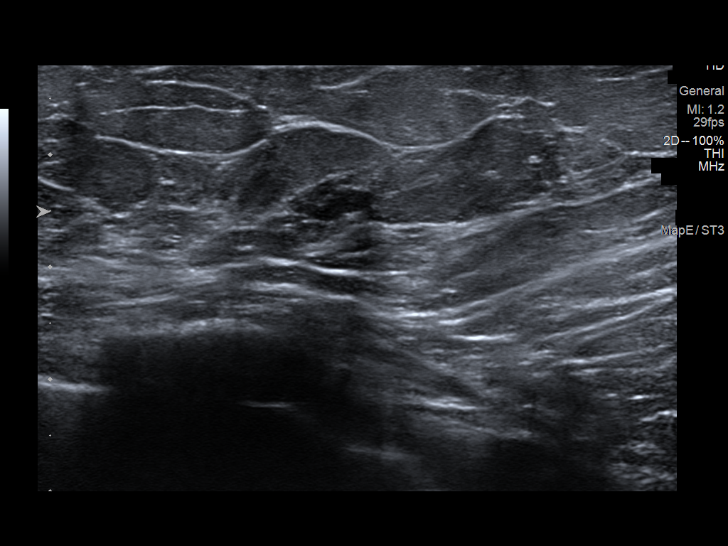
[im 13/15]
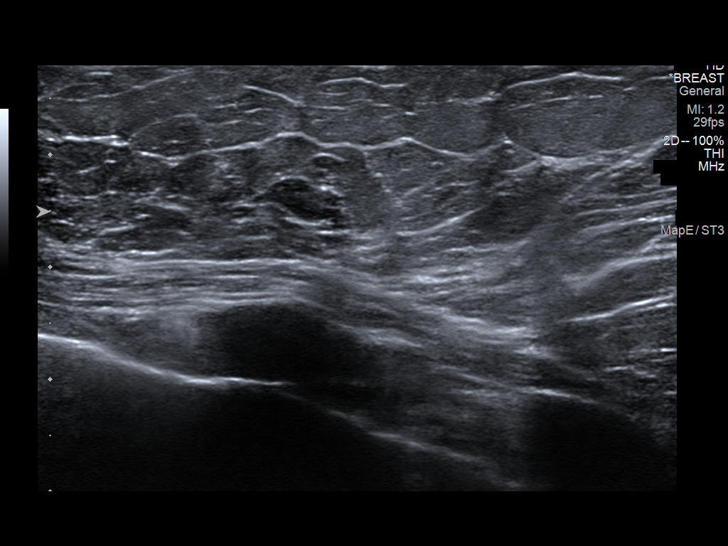
[im 14/15]
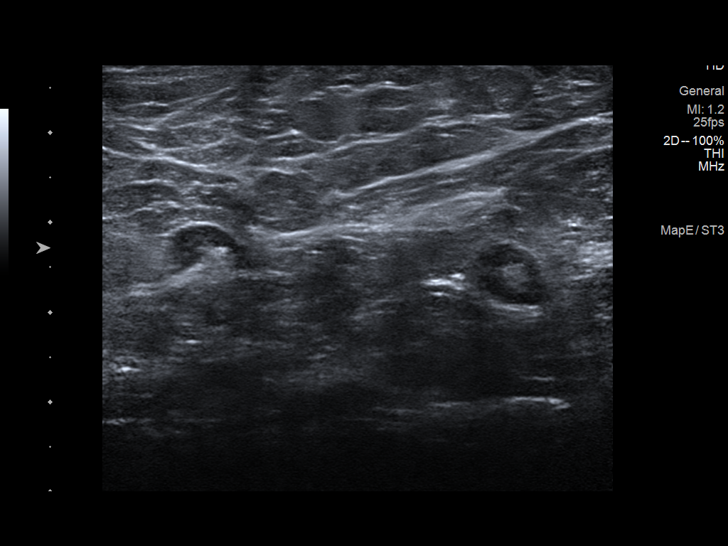
[im 15/15]
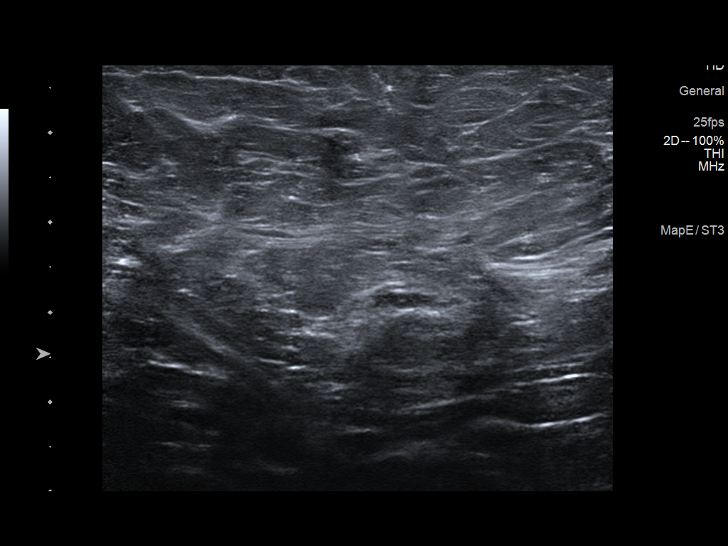

[13 of 15 positions shown; findings below may reference images not displayed]

ACR Breast Density Category b: There are scattered areas of
fibroglandular density.
FINDINGS: Mammogram:

Right breast: Full field and spot compression tomosynthesis views of
the right breast were performed. On the additional imaging there is
persistence of an oval mass in the upper central right breast
measuring approximately 1.1 cm. There is also a tubular mass in the
far posteromedial aspect of the breast measuring approximately
cm.

Left breast: Full field and spot compression tomosynthesis views of
the left breast performed for the questioned asymmetry in the upper
slightly inner aspect of the left breast. On the additional imaging
the tissue in this area disperses without persistent mass or
distortion.

Ultrasound:

Targeted ultrasound is performed in the right breast at 12 o'clock 3
cm from the nipple demonstrating an irregular hypoechoic mass
measuring 1.1 x 0.5 x 1.1 cm. No internal blood flow identified.
This corresponds to the finding identified mammographically.

Targeted ultrasound in the right breast at 2 o'clock 14 cm from the
nipple demonstrates an oval elongated mass with indistinct margins
measuring 2.6 x 0.5 x 0.9 cm. No internal blood flow identified.
This corresponds to the mammographic finding.

Targeted ultrasound of the right axilla demonstrates normal
appearing lymph nodes.

Targeted ultrasound of the upper-outer quadrant the left breast
demonstrates no suspicious cystic or solid mass.
IMPRESSION: 1. Right breast mass at 12 o'clock measuring 1.1 cm is
indeterminate.

2. Right breast mass at 2 o'clock measuring 2.6 cm is also
indeterminate.

3. No mammographic or sonographic evidence of malignancy in the
upper-outer quadrant of the left breast.

RECOMMENDATION:
Ultrasound-guided core needle biopsies of the right breast masses at
12 o'clock and 2 o'clock. Patient indicated she is awaiting results
and treatment planning for bladder cancer and so would like to call
back at the end of the week to schedule the breast biopsies.

I have discussed the findings and recommendations with the patient.
If applicable, a reminder letter will be sent to the patient
regarding the next appointment.

BI-RADS CATEGORY  4: Suspicious.

## 2019-06-01 ENCOUNTER — Ambulatory Visit
Admission: RE | Admit: 2019-06-01 | Discharge: 2019-06-01 | Disposition: A | Discharge status: Home / Self Care | Payer: 59 | Source: Ambulatory Visit | Attending: Internal Medicine | Admitting: Internal Medicine

## 2019-06-01 ENCOUNTER — Other Ambulatory Visit: Payer: 59

## 2019-06-01 ENCOUNTER — Other Ambulatory Visit: Payer: Self-pay

## 2019-06-01 DIAGNOSIS — N6310 Unspecified lump in the right breast, unspecified quadrant: Secondary | ICD-10-CM

## 2019-06-01 HISTORY — PX: BREAST BIOPSY: SHX20

## 2019-06-01 IMAGING — US US  BREAST BX W/ LOC DEV 1ST LESION IMG BX SPEC US GUIDE*R*
1 series · 12 of 24 positions shown · non-contrast
Comparison: Previous exam(s).
COMPARISON: Previous exam(s).

Addendum:
CLINICAL DATA: Ultrasound-guided core needle biopsy was recommended
of a mass in the 12 o'clock position the right breast 3 cm from the
nipple.

Please note that a second right breast mass was biopsied today, and
dictated separately.
EXAM:
ULTRASOUND GUIDED RIGHT BREAST CORE NEEDLE BIOPSY

[Series 1: us breast bx w/ loc dev 1st lesion img bx spec us  · 0.07mm/px · 12 of 24 slices shown]
[im 2/24]
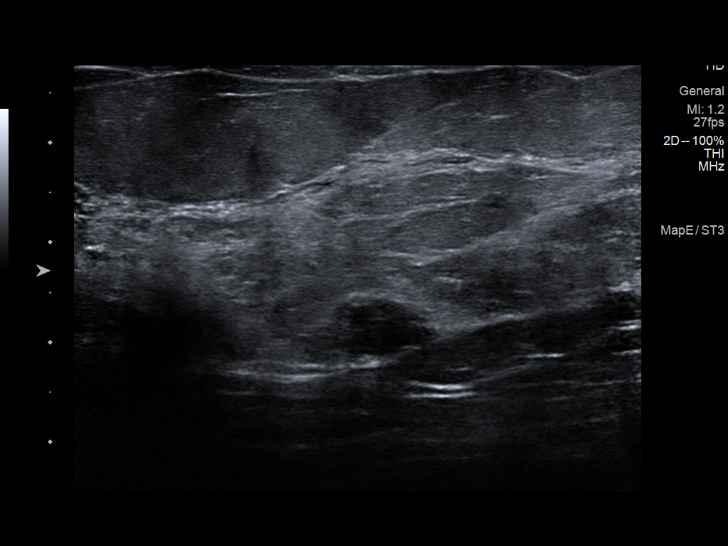
[im 4/24]
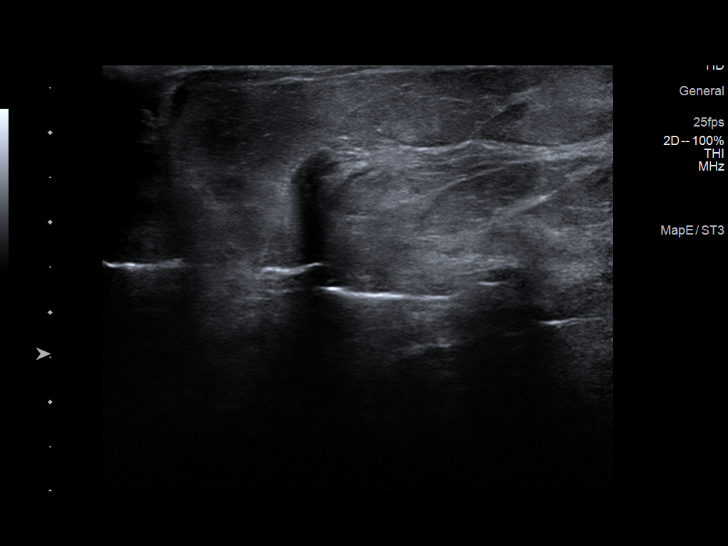
[im 6/24]
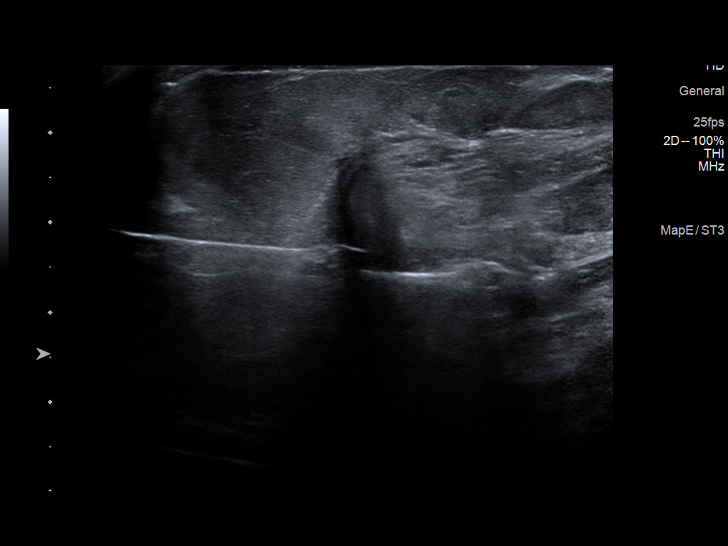
[im 8/24]
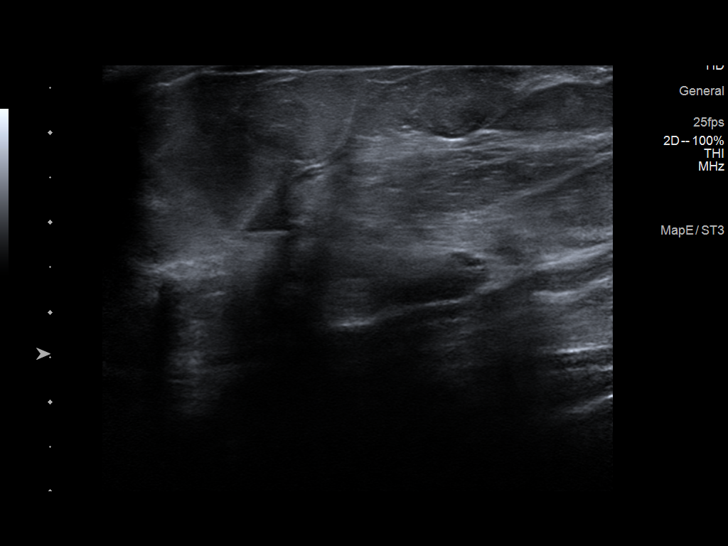
[im 10/24]
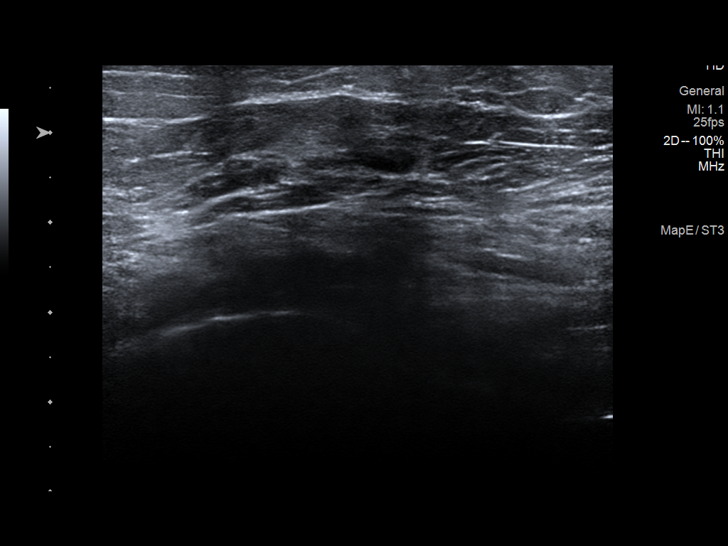
[im 12/24]
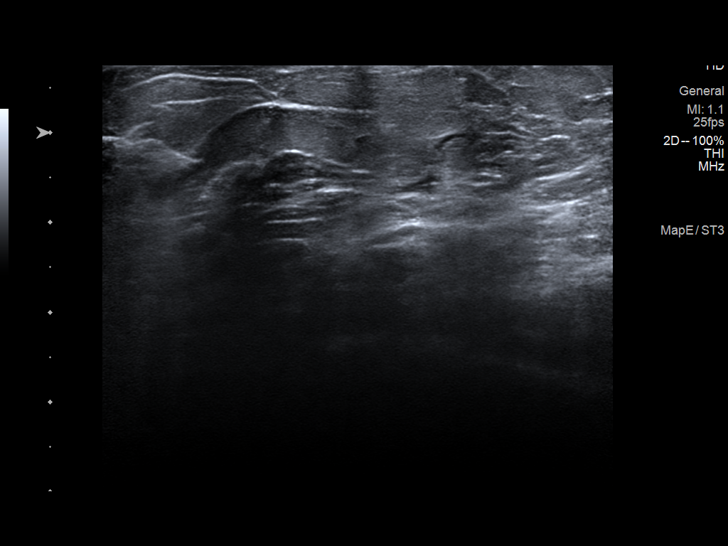
[im 14/24]
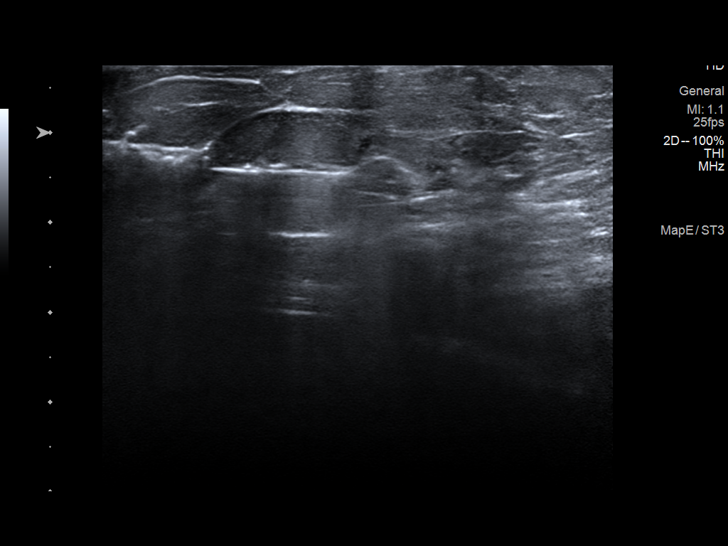
[im 16/24]
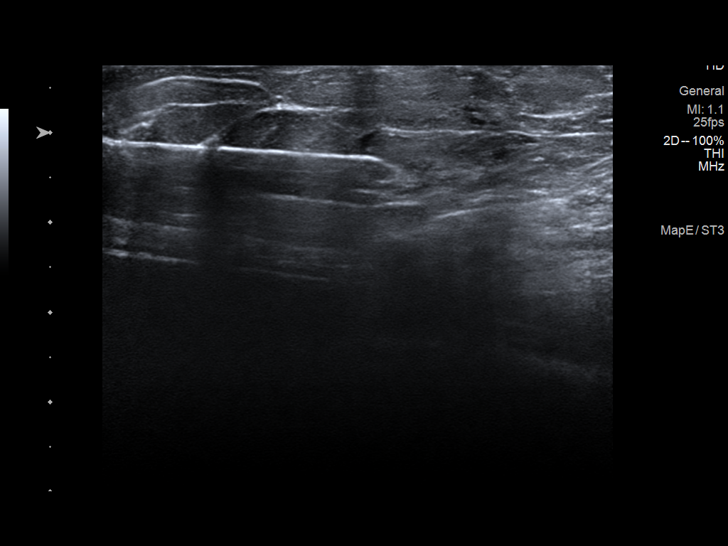
[im 18/24]
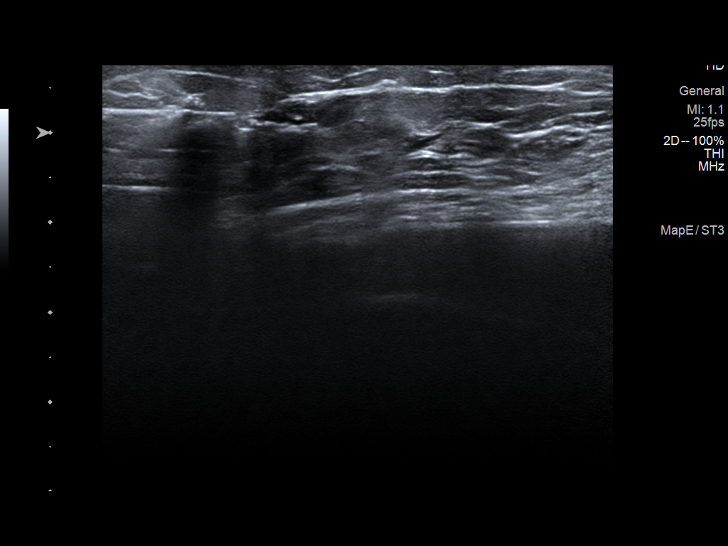
[im 20/24]
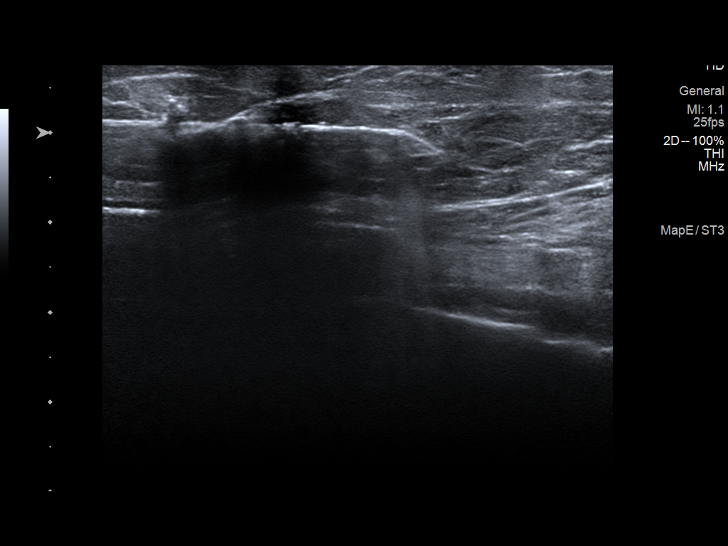
[im 22/24]
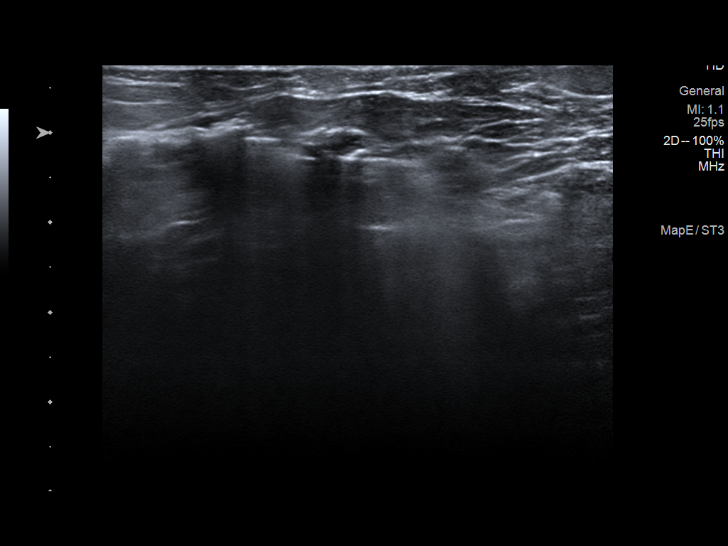
[im 24/24]
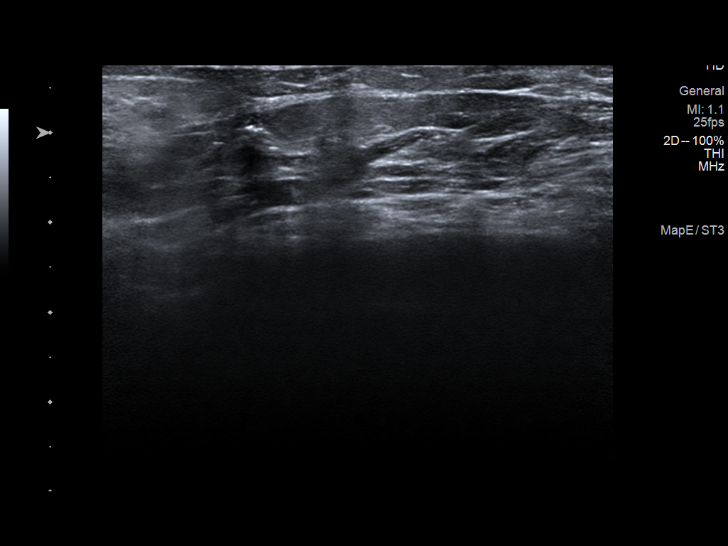

[12 of 24 positions shown; findings below may reference images not displayed]



Lesion quadrant: Upper outer quadrant

Using sterile technique and 1% Lidocaine as local anesthetic, under
direct ultrasound visualization, a 12 gauge ROTZIOKOS device was
used to perform biopsy of a mass in the 12 o'clock position of the
right breast using a lateral approach.

Due to the deep position in the breast with the patient lying
supine, and the presence of the adjacent pectoralis muscle, the
needle was slowly advanced into/adjacent to the superficial aspect
of the mass and 2 samples were taken. At the conclusion of the
procedure a ribbon tissue marker clip was deployed into the biopsy
cavity. Follow up 2 view mammogram was performed and dictated
separately.
IMPRESSION: Ultrasound guided biopsy of the right breast. No apparent
complications.

ADDENDUM:
Pathology revealed MILD FIBROCYSTIC CHANGE of the Right breast, 12
o'clock, [LG]. This was found to be concordant by Dr. ROTZIOKOS.

Pathology revealed FIBROADENOMA of the Right breast, 2 o'clock. This
was found to be concordant by Dr. ROTZIOKOS.

Pathology results were discussed with the patient by telephone by
ROTZIOKOS, RN Nurse Navigator. The patient reported doing well
after the biopsies with tenderness at the sites. Post biopsy
instructions and care were reviewed and questions were answered. The
patient was encouraged to call [REDACTED] for any additional concerns.

The patient was instructed to return for annual screening
mammography and informed a reminder notice would be sent regarding
this appointment.

Pathology results reported by ROTZIOKOS, RN on [DATE].



Lesion quadrant: Upper outer quadrant

Using sterile technique and 1% Lidocaine as local anesthetic, under
direct ultrasound visualization, a 12 gauge ROTZIOKOS device was
used to perform biopsy of a mass in the 12 o'clock position of the
right breast using a lateral approach.

Due to the deep position in the breast with the patient lying
supine, and the presence of the adjacent pectoralis muscle, the
needle was slowly advanced into/adjacent to the superficial aspect
of the mass and 2 samples were taken. At the conclusion of the
procedure a ribbon tissue marker clip was deployed into the biopsy
cavity. Follow up 2 view mammogram was performed and dictated
separately.
IMPRESSION: Ultrasound guided biopsy of the right breast. No apparent
complications.

## 2019-06-01 IMAGING — MG MM BREAST LOCALIZATION CLIP
4 series · 4 of 12 positions shown · non-contrast
Comparison: Previous exam(s).

CLINICAL DATA: Two ultrasound-guided biopsies were performed of the
right breast.

EXAM:
DIAGNOSTIC RIGHT MAMMOGRAM POST ULTRASOUND BIOPSIES

[R CC synth-2D]
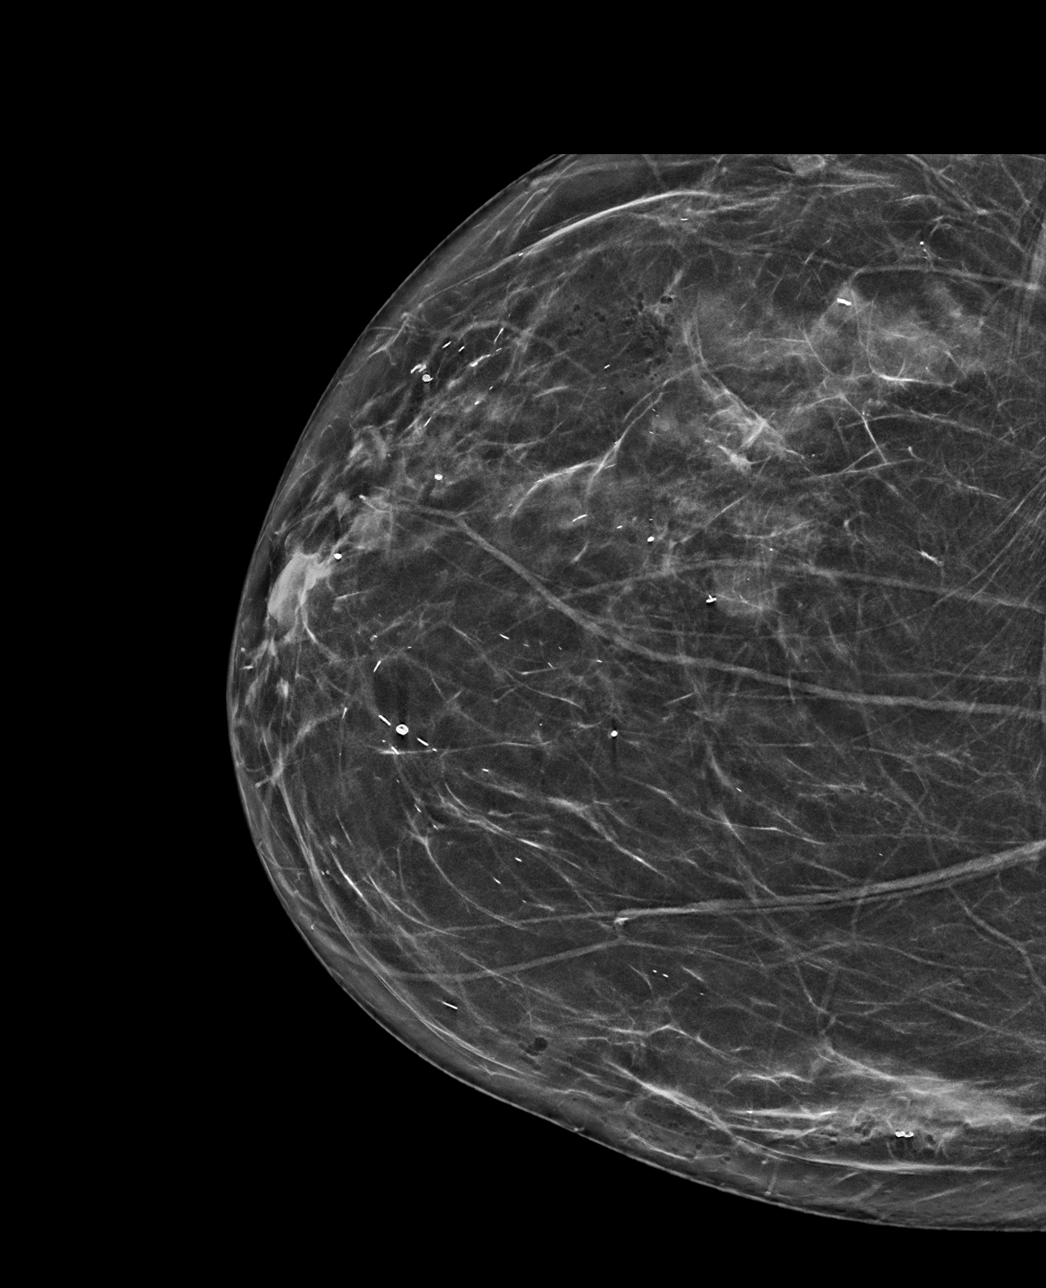

[R ML synth-2D]
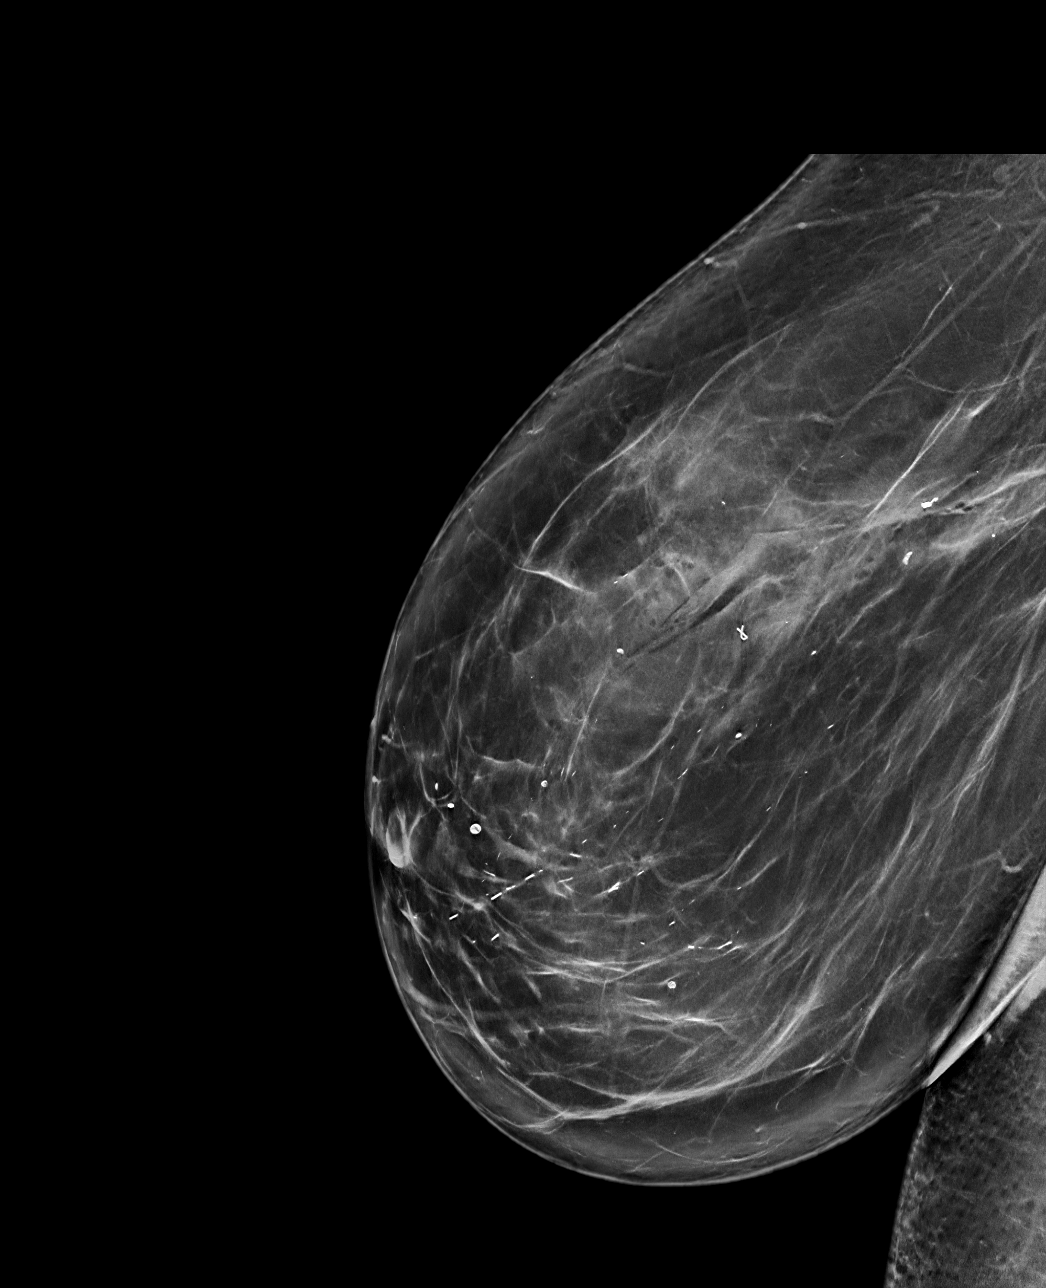

[R ML tomo · tomo slice 47/94.0]
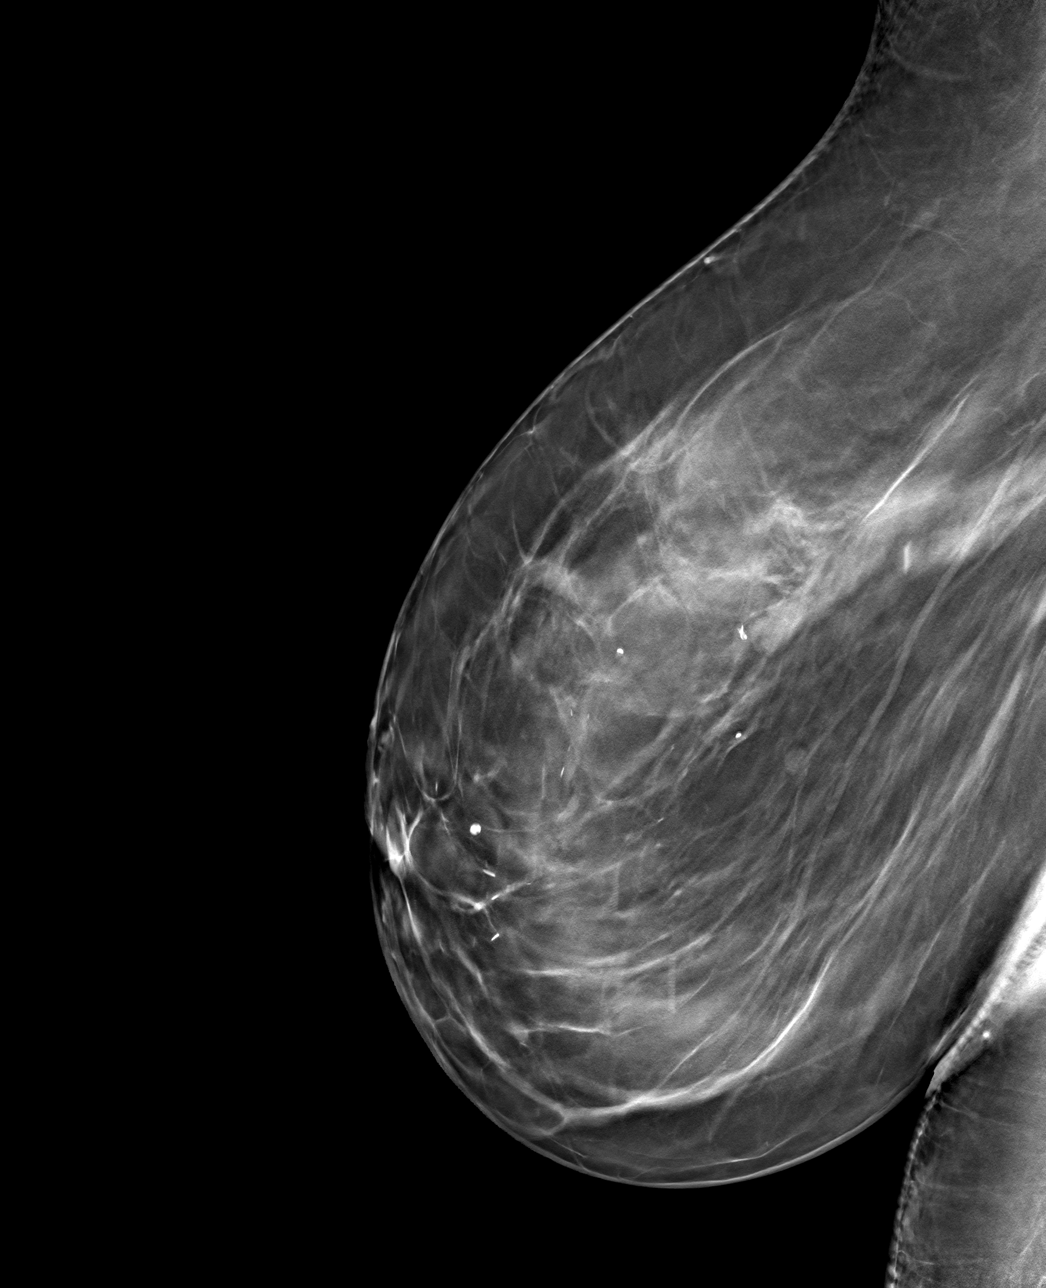

[R CC tomo · tomo slice 35/69.0]
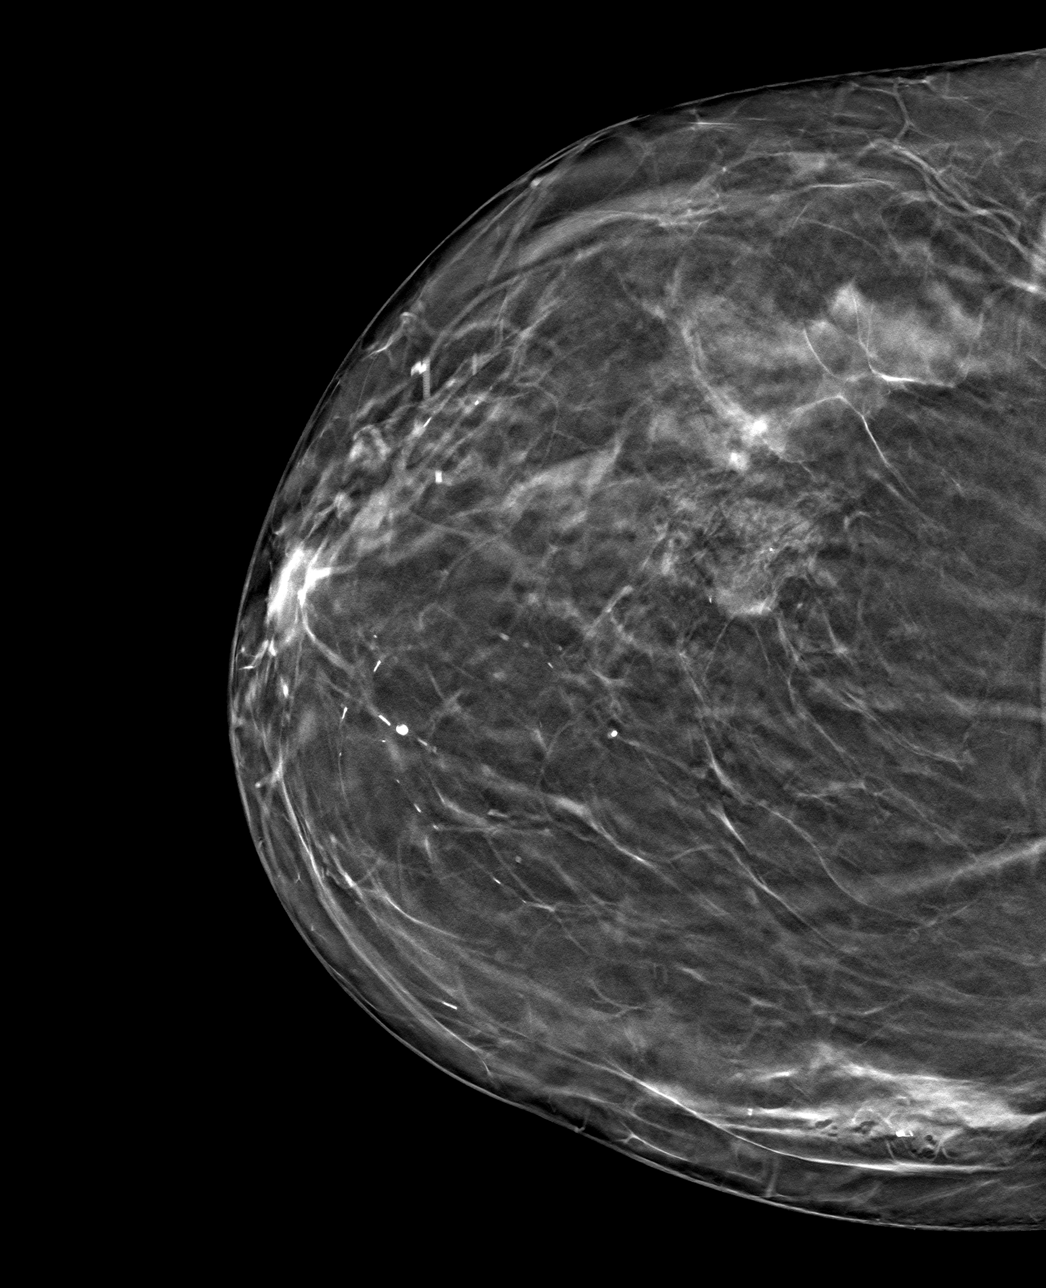

[4 of 12 positions shown; findings below may reference images not displayed]

FINDINGS: Mammographic images were obtained following ultrasound guided biopsy
of the right breast. The ribbon shaped biopsy clip biopsy marking
clip is along the anterior margin of the mass in the 12 o'clock
position of the right breast, satisfactorily positioned.

The coil shaped biopsy clip is satisfactorily positioned at the site
of the biopsied mass at 2 o'clock position right breast.
IMPRESSION: Appropriate positioning of the ribbon and coil shaped biopsy marking
clips in the 12 o'clock and 2 o'clock positions respectively.

Final Assessment: Post Procedure Mammograms for Marker Placement

## 2019-08-01 ENCOUNTER — Emergency Department (HOSPITAL_BASED_OUTPATIENT_CLINIC_OR_DEPARTMENT_OTHER)
Admission: EM | Admit: 2019-08-01 | Discharge: 2019-08-02 | Disposition: A | Discharge status: Home / Self Care | Payer: 59 | Attending: Emergency Medicine | Admitting: Emergency Medicine

## 2019-08-01 ENCOUNTER — Other Ambulatory Visit: Payer: Self-pay

## 2019-08-01 ENCOUNTER — Encounter (HOSPITAL_BASED_OUTPATIENT_CLINIC_OR_DEPARTMENT_OTHER): Payer: Self-pay

## 2019-08-01 DIAGNOSIS — C679 Malignant neoplasm of bladder, unspecified: Secondary | ICD-10-CM | POA: Insufficient documentation

## 2019-08-01 DIAGNOSIS — R7303 Prediabetes: Secondary | ICD-10-CM | POA: Insufficient documentation

## 2019-08-01 DIAGNOSIS — R1110 Vomiting, unspecified: Secondary | ICD-10-CM | POA: Diagnosis present

## 2019-08-01 DIAGNOSIS — R748 Abnormal levels of other serum enzymes: Secondary | ICD-10-CM | POA: Insufficient documentation

## 2019-08-01 DIAGNOSIS — R112 Nausea with vomiting, unspecified: Secondary | ICD-10-CM | POA: Insufficient documentation

## 2019-08-01 DIAGNOSIS — I10 Essential (primary) hypertension: Secondary | ICD-10-CM | POA: Insufficient documentation

## 2019-08-01 DIAGNOSIS — Z87891 Personal history of nicotine dependence: Secondary | ICD-10-CM | POA: Diagnosis not present

## 2019-08-01 DIAGNOSIS — I2510 Atherosclerotic heart disease of native coronary artery without angina pectoris: Secondary | ICD-10-CM | POA: Insufficient documentation

## 2019-08-01 DIAGNOSIS — Z79899 Other long term (current) drug therapy: Secondary | ICD-10-CM | POA: Insufficient documentation

## 2019-08-01 HISTORY — DX: Malignant neoplasm of bladder, unspecified: C67.9

## 2019-08-01 LAB — CBC
HCT: 46.8 % — ABNORMAL HIGH (ref 36.0–46.0)
Hemoglobin: 15.2 g/dL — ABNORMAL HIGH (ref 12.0–15.0)
MCH: 28 pg (ref 26.0–34.0)
MCHC: 32.5 g/dL (ref 30.0–36.0)
MCV: 86.3 fL (ref 80.0–100.0)
Platelets: 259 10*3/uL (ref 150–400)
RBC: 5.42 MIL/uL — ABNORMAL HIGH (ref 3.87–5.11)
RDW: 14.9 % (ref 11.5–15.5)
WBC: 10.3 10*3/uL (ref 4.0–10.5)
nRBC: 0 % (ref 0.0–0.2)

## 2019-08-01 LAB — COMPREHENSIVE METABOLIC PANEL
ALT: 461 U/L — ABNORMAL HIGH (ref 0–44)
AST: 414 U/L — ABNORMAL HIGH (ref 15–41)
Albumin: 4.3 g/dL (ref 3.5–5.0)
Alkaline Phosphatase: 165 U/L — ABNORMAL HIGH (ref 38–126)
Anion gap: 17 — ABNORMAL HIGH (ref 5–15)
BUN: 15 mg/dL (ref 8–23)
CO2: 24 mmol/L (ref 22–32)
Calcium: 9 mg/dL (ref 8.9–10.3)
Chloride: 99 mmol/L (ref 98–111)
Creatinine, Ser: 1.04 mg/dL — ABNORMAL HIGH (ref 0.44–1.00)
GFR calc Af Amer: >60 mL/min (ref >60)
GFR calc non Af Amer: 57 mL/min — ABNORMAL LOW (ref >60)
Glucose, Bld: 226 mg/dL — ABNORMAL HIGH (ref 70–99)
Potassium: 3.5 mmol/L (ref 3.5–5.1)
Sodium: 140 mmol/L (ref 135–145)
Total Bilirubin: 1.7 mg/dL — ABNORMAL HIGH (ref 0.3–1.2)
Total Protein: 8.3 g/dL — ABNORMAL HIGH (ref 6.5–8.1)

## 2019-08-01 LAB — URINALYSIS, ROUTINE W REFLEX MICROSCOPIC
Bilirubin Urine: NEGATIVE
Glucose, UA: 250 mg/dL — AB
Ketones, ur: 15 mg/dL — AB
Leukocytes,Ua: NEGATIVE
Nitrite: NEGATIVE
Protein, ur: >300 mg/dL — AB
Specific Gravity, Urine: >1.03 — ABNORMAL HIGH (ref 1.005–1.030)
pH: 7 (ref 5.0–8.0)

## 2019-08-01 LAB — URINALYSIS, MICROSCOPIC (REFLEX): Bacteria, UA: NONE SEEN

## 2019-08-01 LAB — LIPASE, BLOOD: Lipase: 23 U/L (ref 11–51)

## 2019-08-01 MED ORDER — ONDANSETRON 4 MG PO TBDP
4.0000 mg | ORAL_TABLET | Freq: Once | ORAL | Status: AC | PRN
  Administered 2019-08-01: 4 mg via ORAL
  Filled 2019-08-01: qty 1

## 2019-08-01 NOTE — ED Triage Notes (Signed)
Pt reports she has had N/V 6 times today. Pt reports epigastric pain.

## 2019-08-01 NOTE — ED Provider Notes (Signed)
[place] [facility] Provider Note: Georgena Spurling, MD, FACEP  CSN: [id number] MRN: [id number] ARRIVAL: 08/01/19 at 2132 ROOM: [place]  Vomiting   HISTORY OF PRESENT ILLNESS  08/01/19 11:58 PM Hannah Beard is a 64 y.o. female who is currently undergoing BCG treatments for bladder cancer.  She has had her sixth treatment.  She woke up about 3 AM this morning with the need to have a bowel movement.  She had 2 bowel movements which were normal.  She then developed nausea and vomiting and estimates she has vomited about 6 times.  She is concerned she may be dehydrated and was told this was a concern when on BCG.  She is having epigastric discomfort which she rates as a 5 out of 10, worse with movement or palpation.  She states it feels like soreness from vomiting.  She denies diarrhea.  She denies fever.  She was given Zofran prior to my evaluation with improvement in her nausea.    Past Medical History:  Diagnosis Date  . (HFpEF) heart failure with preserved ejection fraction ([place]) 02/2014   volume excess in setting of STEMI and volume resuscitation (EF preserved) post cardiac cath;  last echo 02-20-2014  ef 55-60%. G2DD  . Bladder cancer ([place])   . CAD (coronary artery disease) previous cardiologist Dr Burt Knack, lov note in epic 04-28-2014  (03-13-2019  per pt has not seen any doctor in approx. 5 yrs ago, but denies any cardiac s&s)   a. 02/19/14 inf STEMI s/p DES to LCx and moderate stenosis midRCA  . Eczema   . History of pleural empyema    left side  08-30-2009 s/p  left VATS w/ drainage/ decortication  . History of ST elevation myocardial infarction (STEMI) 02/19/2014  . HLD (hyperlipidemia)   . Hypertension    03-13-2019  was on medication 5 yrs ago but none since this time due to not having seen any doctor for 5 yrs due to insurance issue's  . Pre-diabetes    watches diet  . S/P drug eluting coronary stent placement    02-20-2004   PCI and DES x1 to LCx    . Wears glasses     Past Surgical History:  Procedure Laterality Date  . CYSTOSCOPY WITH BIOPSY N/A 04/29/2019   Procedure: CYSTOSCOPY WITH BLADDER BIOPSY/ RIGHT STENT REMOVAL;  Surgeon: Ceasar Mons, MD;  Location: [facility];  Service: Urology;  Laterality: N/A;  . DILATION AND CURETTAGE OF UTERUS  yrs ago  . LEFT HEART CATH N/A 02/19/2014   Procedure: LEFT HEART CATH;  Surgeon: Blane Ohara, MD;  Location: [facility] CATH LAB;  Service: Cardiovascular;  Laterality: N/A;  . TRANSURETHRAL RESECTION OF BLADDER TUMOR N/A 03/18/2019   Procedure: TRANSURETHRAL RESECTION OF BLADDER TUMOR (TURBT) WITH CYSTOSCOPY RIGHT STENT PLACEMENT/ INTRAVESICAL INSTILLATION OF GEMCITABINE;  Surgeon: Ceasar Mons, MD;  Location: [facility];  Service: Urology;  Laterality: N/A;  . TUBAL LIGATION Bilateral yrs ago  . VIDEO ASSISTED THORACOSCOPY (VATS)/EMPYEMA Left 08-30-2009   dr hendrickson  @[facility]     Family History  Problem Relation Age of Onset  . Cancer Mother   . Diabetes Mother   . Heart disease Mother   . Hyperlipidemia Mother   . Hypertension Mother   . Heart disease Maternal Grandmother   . Heart failure Maternal Grandmother   . Heart disease Maternal Grandfather   . Diabetes Maternal Grandfather   . Heart attack Maternal Grandfather   .  Diabetes Brother   . Heart disease Brother   . Hyperlipidemia Brother   . Hypertension Brother   . Diabetes Brother   . Hyperlipidemia Brother   . Hypertension Brother   . Stroke Neg Hx     Social History   Tobacco Use  . Smoking status: Former Smoker    Years: 20.00    Types: Cigarettes    Quit date: 08/28/2009    Years since quitting: 9.9  . Smokeless tobacco: Never Used  Vaping Use  . Vaping Use: Never used  Substance Use Topics  . Alcohol use: Yes    Alcohol/week: 0.0 standard drinks    Comment: occasional  . Drug use: Never    Prior to Admission medications   Medication Sig Start Date End  Date Taking? Authorizing Provider  Ascorbic Acid (VITAMIN C CR) 500 MG CPCR Take 1 capsule by mouth 3 (three) times daily.    [provider]  atorvastatin (LIPITOR) 80 MG tablet Take 1 tablet (80 mg total) by mouth daily. Patient taking differently: Take 80 mg by mouth every evening.  03/27/19   Nicolette Bang, DO  Cholecalciferol (VITAMIN D3) 25 MCG (1000 UT) CAPS Take 1 capsule by mouth daily.    [provider]  ondansetron (ZOFRAN ODT) 8 MG disintegrating tablet Take 1 tablet (8 mg total) by mouth every 8 (eight) hours as needed for nausea or vomiting. 08/02/19   Klayten Jolliff, MD  triamcinolone cream (KENALOG) 0.1 % Apply 1 application topically 2 (two) times daily. 04/20/19   Nicolette Bang, DO    Allergies Patient has no known allergies.   REVIEW OF SYSTEMS  Negative except as noted here or in the History of Present Illness.   PHYSICAL EXAMINATION  Initial Vital Signs Blood pressure (!) 219/105, pulse 79, resp. rate (!) 26, height 5' 4.5" (1.638 m), weight 112 kg, SpO2 96 %.  Examination General: Well-developed, obese female in no acute distress; appearance consistent with age of record HENT: normocephalic; atraumatic Eyes: pupils equal, round and reactive to light; extraocular muscles intact Neck: supple Heart: regular rate and rhythm Lungs: clear to auscultation bilaterally Abdomen: soft; nondistended; mild epigastric tenderness; no masses or hepatosplenomegaly; bowel sounds present Extremities: No deformity; full range of motion; pulses normal Neurologic: Awake, alert and oriented; motor function intact in all extremities and symmetric; no facial droop Skin: Warm and dry Psychiatric: Normal mood and affect   RESULTS  Summary of this visit's results, reviewed and interpreted by myself:   EKG Interpretation  Date/Time:    Ventricular Rate:    PR Interval:    QRS Duration:   QT Interval:    QTC Calculation:   R Axis:     Text  Interpretation:        Laboratory Studies: Results for orders placed or performed during the hospital encounter of 08/01/19 (from the past 24 hour(s))  Urinalysis, Routine w reflex microscopic     Status: Abnormal   Collection Time: 08/01/19 10:32 PM  Result Value Ref Range   Color, Urine YELLOW YELLOW   APPearance CLEAR CLEAR   Specific Gravity, Urine >1.030 (H) 1.005 - 1.030   pH 7.0 5.0 - 8.0   Glucose, UA 250 (A) NEGATIVE mg/dL   Hgb urine dipstick TRACE (A) NEGATIVE   Bilirubin Urine NEGATIVE NEGATIVE   Ketones, ur 15 (A) NEGATIVE mg/dL   Protein, ur >300 (A) NEGATIVE mg/dL   Nitrite NEGATIVE NEGATIVE   Leukocytes,Ua NEGATIVE NEGATIVE  Urinalysis, Microscopic (reflex)  Status: None   Collection Time: 08/01/19 10:32 PM  Result Value Ref Range   RBC / HPF 0-5 0 - 5 RBC/hpf   WBC, UA 0-5 0 - 5 WBC/hpf   Bacteria, UA NONE SEEN NONE SEEN   Squamous Epithelial / LPF 0-5 0 - 5  Lipase, blood     Status: None   Collection Time: 08/01/19 11:05 PM  Result Value Ref Range   Lipase 23 11 - 51 U/L  Comprehensive metabolic panel     Status: Abnormal   Collection Time: 08/01/19 11:05 PM  Result Value Ref Range   Sodium 140 135 - 145 mmol/L   Potassium 3.5 3.5 - 5.1 mmol/L   Chloride 99 98 - 111 mmol/L   CO2 24 22 - 32 mmol/L   Glucose, Bld 226 (H) 70 - 99 mg/dL   BUN 15 8 - 23 mg/dL   Creatinine, Ser 1.04 (H) 0.44 - 1.00 mg/dL   Calcium 9.0 8.9 - 10.3 mg/dL   Total Protein 8.3 (H) 6.5 - 8.1 g/dL   Albumin 4.3 3.5 - 5.0 g/dL   AST 414 (H) 15 - 41 U/L   ALT 461 (H) 0 - 44 U/L   Alkaline Phosphatase 165 (H) 38 - 126 U/L   Total Bilirubin 1.7 (H) 0.3 - 1.2 mg/dL   GFR calc non Af Amer 57 (L) >60 mL/min   GFR calc Af Amer >60 >60 mL/min   Anion gap 17 (H) 5 - 15  CBC     Status: Abnormal   Collection Time: 08/01/19 11:05 PM  Result Value Ref Range   WBC 10.3 4.0 - 10.5 K/uL   RBC 5.42 (H) 3.87 - 5.11 MIL/uL   Hemoglobin 15.2 (H) 12.0 - 15.0 g/dL   HCT 46.8 (H) 36 - 46 %     MCV 86.3 80.0 - 100.0 fL   MCH 28.0 26.0 - 34.0 pg   MCHC 32.5 30.0 - 36.0 g/dL   RDW 14.9 11.5 - 15.5 %   Platelets 259 150 - 400 K/uL   nRBC 0.0 0.0 - 0.2 %   Imaging Studies: No results found.  ED COURSE and MDM  Nursing notes, initial and subsequent vitals signs, including pulse oximetry, reviewed and interpreted by myself.  Vitals:   08/01/19 2146 08/01/19 2333 08/01/19 2339 08/02/19 0200  BP:  (!) 219/117 (!) 219/105 (!) 196/103  Pulse:  (!) 7 79 94  Resp:  19 (!) 26 (!) 22  SpO2:  98% 96% 92%  Weight: 112 kg     Height: 5' 4.5" (1.638 m)      Medications  ondansetron (ZOFRAN-ODT) disintegrating tablet 4 mg (4 mg Oral Given 08/01/19 2158)  sodium chloride 0.9 % bolus 1,000 mL (0 mLs Intravenous Stopped 08/02/19 0206)   2:13 AM Patient now drinking fluids without emesis.  Her abdominal pain is improved, her abdomen is soft and minimally tender in the epigastrium.  An acute hepatitis panel has been sent due to her elevated liver enzymes.  Although she is on BCG therapy this is not a common complication of that.  She will contact her primary care physician, Dr. Juleen China, on Monday regarding liver follow-up.   PROCEDURES  Procedures   ED DIAGNOSES     ICD-10-CM   1. Nausea and vomiting in adult  R11.2   2. Elevated liver enzymes  R74.8        Kilian Schwartz, Jenny Reichmann, MD 08/02/19 [phone]

## 2019-08-01 NOTE — Progress Notes (Signed)
Placed patient on 1 liter nasal cannula to due patient's SPO2 falling to 86%.  Patient's SPO2 increased to 98%.  RT will continue to monitor.

## 2019-08-02 ENCOUNTER — Encounter (HOSPITAL_BASED_OUTPATIENT_CLINIC_OR_DEPARTMENT_OTHER): Payer: Self-pay | Admitting: Emergency Medicine

## 2019-08-02 LAB — HEPATITIS PANEL, ACUTE
HCV Ab: NONREACTIVE
Hep A IgM: NONREACTIVE
Hep B C IgM: NONREACTIVE
Hepatitis B Surface Ag: NONREACTIVE

## 2019-08-02 MED ORDER — SODIUM CHLORIDE 0.9 % IV BOLUS
1000.0000 mL | Freq: Once | INTRAVENOUS | Status: AC
  Administered 2019-08-02: 1000 mL via INTRAVENOUS

## 2019-08-02 MED ORDER — ONDANSETRON 8 MG PO TBDP: 8.0000 mg | ORAL_TABLET | Freq: Three times a day (TID) | ORAL | 0 refills | Status: DC | PRN

## 2019-08-04 ENCOUNTER — Encounter: Payer: Self-pay | Admitting: Internal Medicine

## 2019-08-04 ENCOUNTER — Telehealth (INDEPENDENT_AMBULATORY_CARE_PROVIDER_SITE_OTHER): Payer: 59 | Admitting: Internal Medicine

## 2019-08-04 DIAGNOSIS — R748 Abnormal levels of other serum enzymes: Secondary | ICD-10-CM

## 2019-08-04 DIAGNOSIS — R1013 Epigastric pain: Secondary | ICD-10-CM | POA: Diagnosis not present

## 2019-08-04 DIAGNOSIS — I10 Essential (primary) hypertension: Secondary | ICD-10-CM | POA: Diagnosis not present

## 2019-08-04 DIAGNOSIS — R112 Nausea with vomiting, unspecified: Secondary | ICD-10-CM | POA: Diagnosis not present

## 2019-08-04 MED ORDER — AMLODIPINE BESYLATE 5 MG PO TABS: 5.0000 mg | ORAL_TABLET | Freq: Every day | ORAL | 3 refills | Status: DC

## 2019-08-04 NOTE — Progress Notes (Signed)
Virtual Visit via Telephone Note  I connected with Hannah Beard, on 08/04/2019 at 3:48 PM by telephone due to the COVID-19 pandemic and verified that I am speaking with the correct person using two identifiers.   Consent: I discussed the limitations, risks, security and privacy concerns of performing an evaluation and management service by telephone and the availability of in person appointments. I also discussed with the patient that there may be a patient responsible charge related to this service. The patient expressed understanding and agreed to proceed.   Location of Patient: Home   Location of Provider: Clinic    Persons participating in Telemedicine visit: Emilynn Srinivasan [facility] Dr. Juleen China    History of Present Illness: Patient has a visit to follow up on ED visit for nausea and vomiting. Her symptoms started on 7/17. She had eaten [place] food the night before. She had some abdominal pain and had bowel movements x2 that seemed to relieve the discomfort. However, a few hours later she started having recurrent vomiting. Started feeling weak and had daughter take her to ED Saturday night. She was given IVFs and Zofran. She feels better and is able to tolerate some fluid intake and light diet. No further vomiting since ED visit.    Past Medical History:  Diagnosis Date  . (HFpEF) heart failure with preserved ejection fraction ([place]) 02/2014   volume excess in setting of STEMI and volume resuscitation (EF preserved) post cardiac cath;  last echo 02-20-2014  ef 55-60%. G2DD  . Bladder cancer ([place])   . CAD (coronary artery disease) previous cardiologist Dr Burt Knack, lov note in epic 04-28-2014  (03-13-2019  per pt has not seen any doctor in approx. 5 yrs ago, but denies any cardiac s&s)   a. 02/19/14 inf STEMI s/p DES to LCx and moderate stenosis midRCA  . Eczema   . History of pleural empyema    left side  08-30-2009 s/p  left VATS w/ drainage/ decortication  . History of  ST elevation myocardial infarction (STEMI) 02/19/2014  . HLD (hyperlipidemia)   . Hypertension    03-13-2019  was on medication 5 yrs ago but none since this time due to not having seen any doctor for 5 yrs due to insurance issue's  . Pre-diabetes    watches diet  . S/P drug eluting coronary stent placement    02-20-2004   PCI and DES x1 to LCx  . Wears glasses    No Known Allergies  Current Outpatient Medications on File Prior to Visit  Medication Sig Dispense Refill  . Ascorbic Acid (VITAMIN C CR) 500 MG CPCR Take 1 capsule by mouth 3 (three) times daily.    Marland Kitchen atorvastatin (LIPITOR) 80 MG tablet Take 1 tablet (80 mg total) by mouth daily. (Patient taking differently: Take 80 mg by mouth every evening. ) 90 tablet 3  . Cholecalciferol (VITAMIN D3) 25 MCG (1000 UT) CAPS Take 1 capsule by mouth daily.    Marland Kitchen triamcinolone cream (KENALOG) 0.1 % Apply 1 application topically 2 (two) times daily. 453.6 g 0  . ondansetron (ZOFRAN ODT) 8 MG disintegrating tablet Take 1 tablet (8 mg total) by mouth every 8 (eight) hours as needed for nausea or vomiting. (Patient not taking: Reported on 08/04/2019) 10 tablet 0   Current Facility-Administered Medications on File Prior to Visit  Medication Dose Route Frequency Provider Last Rate Last Admin  . gemcitabine (GEMZAR) chemo syringe for bladder instillation 2,000 mg  2,000 mg Bladder Instillation Once Winter, Christopher  Marjory Lies, MD        Observations/Objective: NAD. Speaking clearly.  Work of breathing normal.  Alert and oriented. Mood appropriate.   Assessment and Plan: 1. Abdominal pain, epigastric Resolved.   2. Nausea and vomiting in adult Resolved. Encouraged continued rest, hydration, and bland diet.   3. Elevated liver enzymes AST 414 and ALT 461. Acute hepatitis panel obtained in ED resulted as negative. May be related to acute gastrointestinal illness. Will repeat LFTs. If not improving, will warrant further work up.  - Comprehensive  metabolic panel; Future  4. Essential hypertension BP significantly elevated in ED. Patient reports at urologist office, BPs have been consistently >140/90. Will start Amlodipine. Return in 4-6 weeks for monitoring.  - amLODipine (NORVASC) 5 MG tablet; Take 1 tablet (5 mg total) by mouth daily.  Dispense: 90 tablet; Refill: 3   Follow Up Instructions: Lab visit 7/28; 4-6 week f/u for HTN    I discussed the assessment and treatment plan with the patient. The patient was provided an opportunity to ask questions and all were answered. The patient agreed with the plan and demonstrated an understanding of the instructions.   The patient was advised to call back or seek an in-person evaluation if the symptoms worsen or if the condition fails to improve as anticipated.     I provided 20 minutes total of non-face-to-face time during this encounter including median intraservice time, reviewing previous notes, investigations, ordering medications, medical decision making, coordinating care and patient verbalized understanding at the end of the visit.    Phill Myron, D.O. Primary Care at [facility]  08/04/2019, 3:48 PM

## 2019-08-12 ENCOUNTER — Other Ambulatory Visit (INDEPENDENT_AMBULATORY_CARE_PROVIDER_SITE_OTHER): Payer: 59

## 2019-08-12 ENCOUNTER — Other Ambulatory Visit: Payer: Self-pay

## 2019-08-12 DIAGNOSIS — R748 Abnormal levels of other serum enzymes: Secondary | ICD-10-CM

## 2019-08-13 LAB — COMPREHENSIVE METABOLIC PANEL
ALT: 352 IU/L — ABNORMAL HIGH (ref 0–32)
AST: 201 IU/L — ABNORMAL HIGH (ref 0–40)
Albumin/Globulin Ratio: 1.1 — ABNORMAL LOW (ref 1.2–2.2)
Albumin: 3.9 g/dL (ref 3.8–4.8)
Alkaline Phosphatase: 530 IU/L — ABNORMAL HIGH (ref 48–121)
BUN/Creatinine Ratio: 11 — ABNORMAL LOW (ref 12–28)
BUN: 13 mg/dL (ref 8–27)
Bilirubin Total: 1 mg/dL (ref 0.0–1.2)
CO2: 24 mmol/L (ref 20–29)
Calcium: 9.3 mg/dL (ref 8.7–10.3)
Chloride: 104 mmol/L (ref 96–106)
Creatinine, Ser: 1.23 mg/dL — ABNORMAL HIGH (ref 0.57–1.00)
GFR calc Af Amer: 54 mL/min/{1.73_m2} — ABNORMAL LOW (ref >59)
GFR calc non Af Amer: 47 mL/min/{1.73_m2} — ABNORMAL LOW (ref >59)
Globulin, Total: 3.7 g/dL (ref 1.5–4.5)
Glucose: 114 mg/dL — ABNORMAL HIGH (ref 65–99)
Potassium: 3.8 mmol/L (ref 3.5–5.2)
Sodium: 145 mmol/L — ABNORMAL HIGH (ref 134–144)
Total Protein: 7.6 g/dL (ref 6.0–8.5)

## 2019-08-18 ENCOUNTER — Other Ambulatory Visit: Payer: Self-pay | Admitting: Internal Medicine

## 2019-08-18 DIAGNOSIS — R7989 Other specified abnormal findings of blood chemistry: Secondary | ICD-10-CM

## 2019-08-20 ENCOUNTER — Telehealth: Payer: Self-pay

## 2019-08-20 NOTE — Telephone Encounter (Signed)
Pt given appt for additional labs ordered by Dr. Juleen China on 08/18/19. Pt scheduled to come for lab only appt tomorrow morning 08/21/19. Please advise if there are any other instructions before coming tomorrow for labs.

## 2019-08-21 ENCOUNTER — Other Ambulatory Visit (INDEPENDENT_AMBULATORY_CARE_PROVIDER_SITE_OTHER): Payer: 59

## 2019-08-21 ENCOUNTER — Other Ambulatory Visit: Payer: Self-pay

## 2019-08-21 DIAGNOSIS — R7989 Other specified abnormal findings of blood chemistry: Secondary | ICD-10-CM

## 2019-08-25 LAB — CELIAC DISEASE AB SCREEN W/RFX
Antigliadin Abs, IgA: 7 units (ref 0–19)
IgA/Immunoglobulin A, Serum: 662 mg/dL — ABNORMAL HIGH (ref 87–352)
Transglutaminase IgA: <2 U/mL (ref 0–3)

## 2019-08-25 LAB — TSH: TSH: 3.24 u[IU]/mL (ref 0.450–4.500)

## 2019-08-25 LAB — IRON,TIBC AND FERRITIN PANEL
Ferritin: 223 ng/mL — ABNORMAL HIGH (ref 15–150)
Iron Saturation: 34 % (ref 15–55)
Iron: 104 ug/dL (ref 27–139)
Total Iron Binding Capacity: 308 ug/dL (ref 250–450)
UIBC: 204 ug/dL (ref 118–369)

## 2019-08-25 LAB — CERULOPLASMIN: Ceruloplasmin: 31.3 mg/dL (ref 19.0–39.0)

## 2019-08-27 ENCOUNTER — Other Ambulatory Visit: Payer: Self-pay

## 2019-08-27 ENCOUNTER — Ambulatory Visit (HOSPITAL_COMMUNITY)
Admission: RE | Admit: 2019-08-27 | Discharge: 2019-08-27 | Disposition: A | Discharge status: Home / Self Care | Payer: 59 | Source: Ambulatory Visit | Attending: Internal Medicine | Admitting: Internal Medicine

## 2019-08-27 DIAGNOSIS — R7989 Other specified abnormal findings of blood chemistry: Secondary | ICD-10-CM

## 2019-08-27 IMAGING — US US ABDOMEN LIMITED
1 series · 13 of 25 positions shown · non-contrast
Comparison: [DATE]

CLINICAL DATA: Elevated LFTs.

EXAM:
ULTRASOUND ABDOMEN LIMITED RIGHT UPPER QUADRANT

[Series 1: us abdomen limited · 13 of 48 slices shown]
[im 1/48]
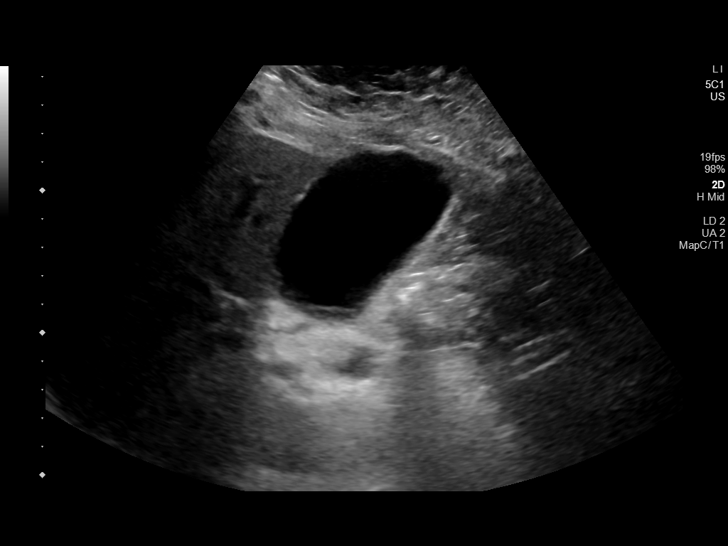
[im 4/48]
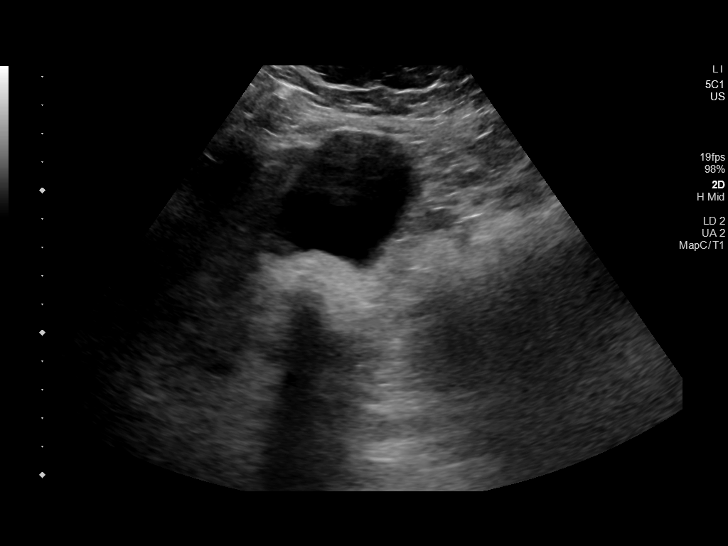
[im 8/48]
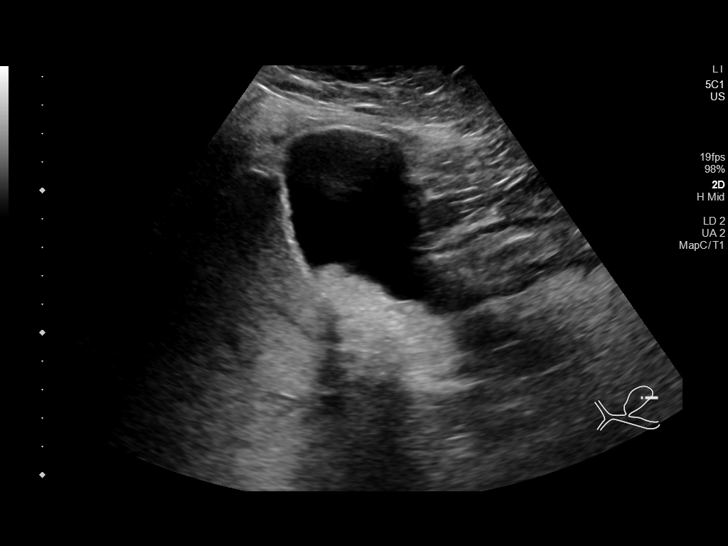
[im 12/48]
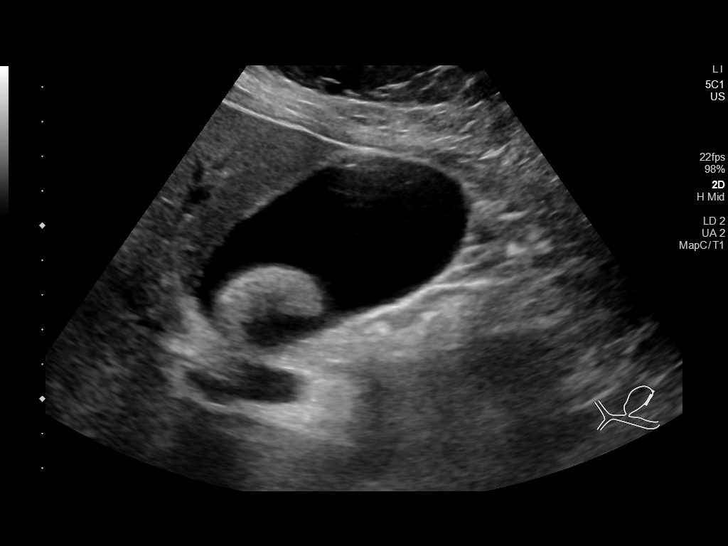
[im 16/48]
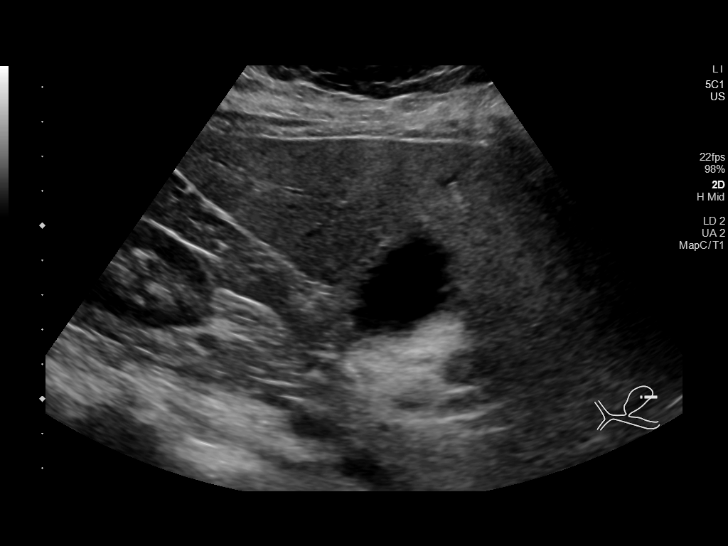
[im 20/48]
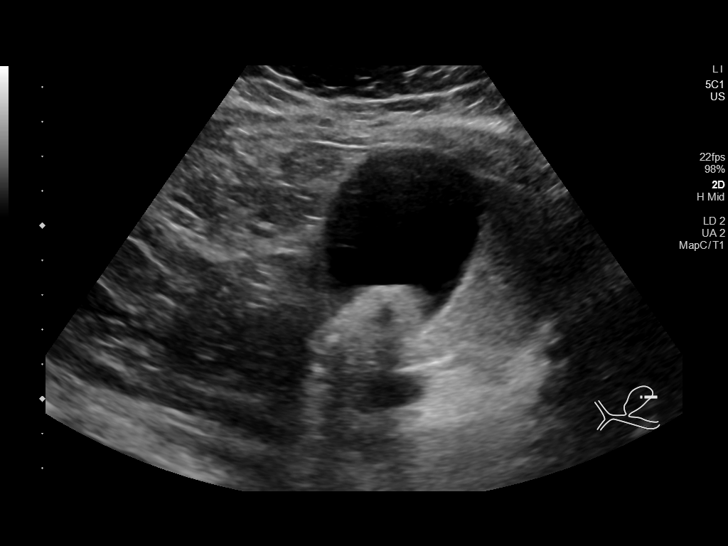
[im 24/48]
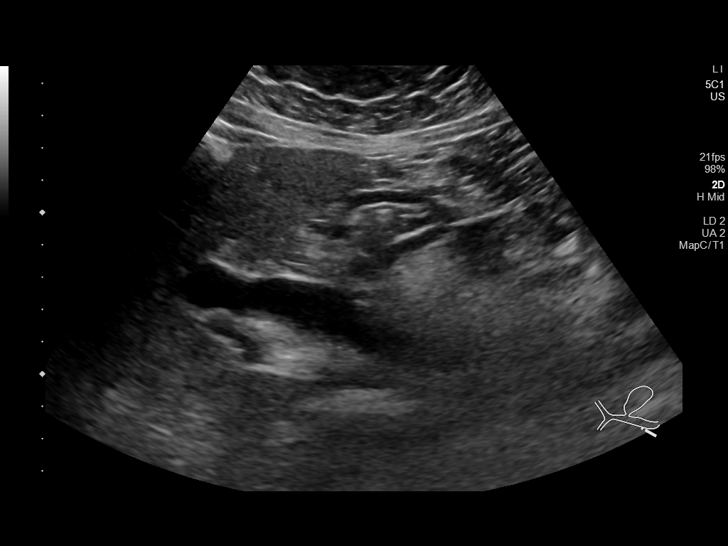
[im 28/48]
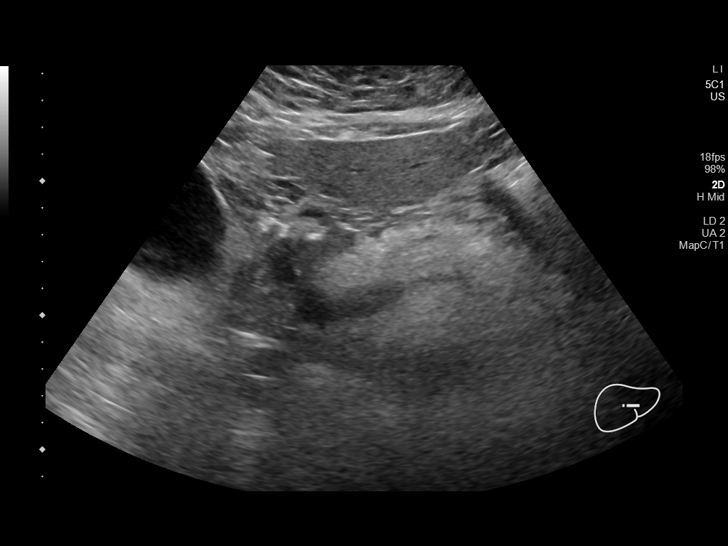
[im 32/48]
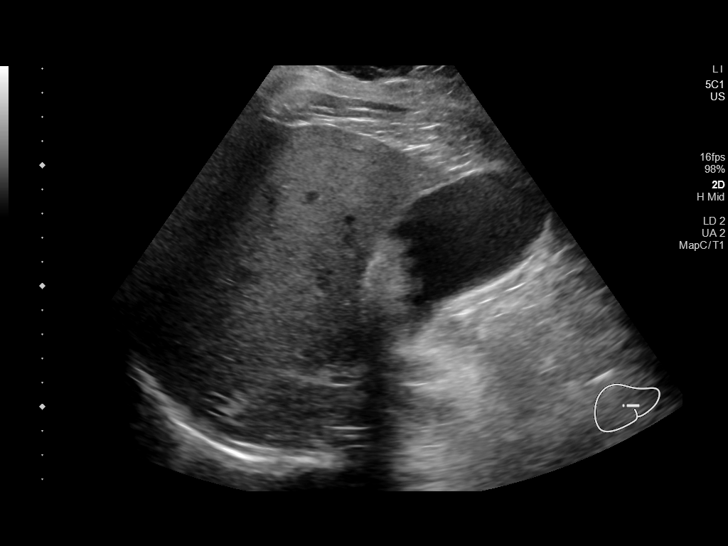
[im 36/48]
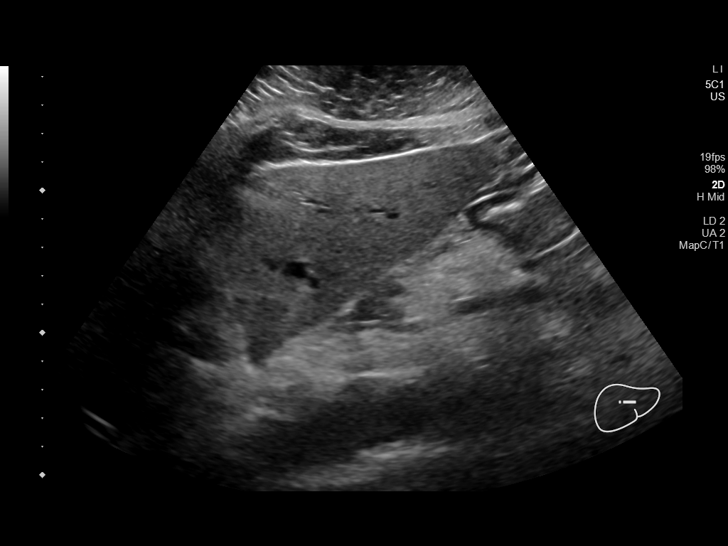
[im 40/48]
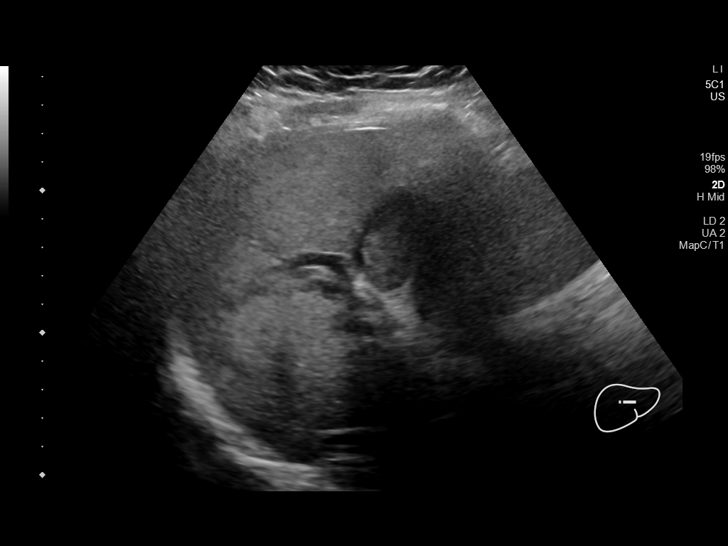
[im 44/48]
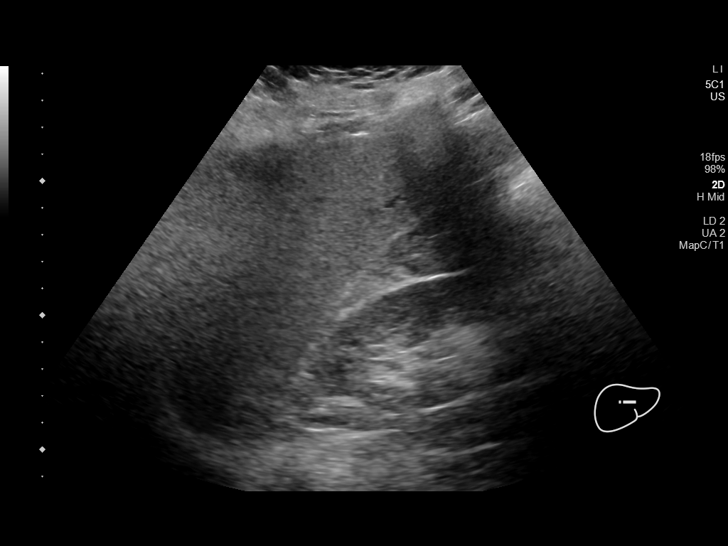
[im 48/48]
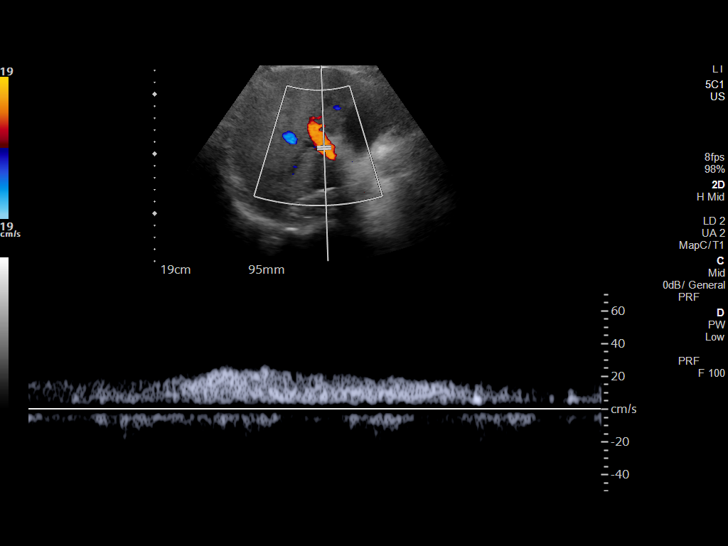

[13 of 25 positions shown; findings below may reference images not displayed]

FINDINGS: Gallbladder:

Exam demonstrates moderate dependent echogenic material over the
gallbladder with shadowing as this measures approximately 4 cm in
greatest diameter. Correlating with the previous CT scan, this
likely represents a combination of tumefactive sludge and stones. No
significant gallbladder wall thickening. No adjacent free fluid and
negative sonographic Murphy sign.

Common bile duct:

Diameter: 1.5 cm.  No definite focal intraductal stone or mass.

Liver:

Mild increased parenchymal echogenicity compatible with a degree of
steatosis without focal mass. Findings suggesting central
intrahepatic ductal dilatation. Portal vein is patent on color
Doppler imaging with normal direction of blood flow towards the
liver.

Other: None.
IMPRESSION: 1. Moderate dependent echogenic material over the gallbladder likely
a combination of tumefactive sludge and stones.
2. Dilated common bile duct measuring 1.5 cm. No definite
intraductal stone or mass. Suggestion of mild central intrahepatic
ductal dilatation. Consider MRCP for further evaluation.
3. Hepatic steatosis without focal mass.

## 2019-08-28 ENCOUNTER — Other Ambulatory Visit: Payer: Self-pay | Admitting: Internal Medicine

## 2019-08-28 DIAGNOSIS — K760 Fatty (change of) liver, not elsewhere classified: Secondary | ICD-10-CM

## 2019-08-28 DIAGNOSIS — K838 Other specified diseases of biliary tract: Secondary | ICD-10-CM

## 2019-08-28 DIAGNOSIS — R7989 Other specified abnormal findings of blood chemistry: Secondary | ICD-10-CM

## 2019-08-31 ENCOUNTER — Encounter: Payer: Self-pay | Admitting: Gastroenterology

## 2019-09-17 ENCOUNTER — Other Ambulatory Visit: Payer: Self-pay | Admitting: Urology

## 2019-09-17 ENCOUNTER — Other Ambulatory Visit: Payer: Self-pay

## 2019-09-17 ENCOUNTER — Encounter (HOSPITAL_BASED_OUTPATIENT_CLINIC_OR_DEPARTMENT_OTHER): Payer: Self-pay | Admitting: Urology

## 2019-09-17 NOTE — Progress Notes (Addendum)
Spoke with Hannah Billow zanetto pa, ok to proceed, order cmet day of surgery, cmet ordered in epic    Spoke w/ via phone for pre-op interview---pt Lab needs dos---- I stat 8               COVID test ------09-19-2019 at [facility] at -------1015 am 09-23-2019  NPO after MN NO Solid Food.  Clear liquids from MN until---9150a m  Medications to take morning of surgery -----none Diabetic medication -----n/a Patient Special Instructions -----n/a Pre-Op special Istructions -----n/aPatient verbalized understanding of instructions that were given at this phone interview. Patient denies shortness of breath, chest pain, fever, cough at this phone interview.  Anesthesia Review: pt in er with nausea and increased lft's see cmet 08-12-2019, hx of cad s/p pci with des x 1 02-20-2004 with stemi post heart failure with preserved ef. Pt  States has not seen cardiology in 5 years, followed by pcp .  patient  States feeling better since er visit, pt has f/u appt with dr Barnetta Chapel wallace  pcp 09-22-2019 at 910 am, has gi appt dr Carmin Muskrat 10-27-2019. Pt had surgery 04-29-2019 [facility] without problems see 04-22-2019 progress note denise sexton. Pt denies any cardiac s & s, peripheral swelling or sob.   Chart to be reviewed by Hannah Billow zanetto pa  PCP:dr catherine wallace lov 08-04-2019 epic, has appt 09-22-2019 at 910 am for f/u elevated liver enzymes see lov dr Juleen China 09-22-2019 epic Cardiologist : previously seen by dr Burt Knack, lov 04-28-2014 epic, no visit since with any cardiologist Chest x-ray :02-20-2014 epic EKG : 08-01-2019 epic Echo :02-20-14 epic Cardiac Cath : 02-19-2014 epic Activity level: does housework and can climb steps without problems Sleep Study/ CPAP :no Fasting Blood Sugar :      / Checks Blood Sugar -- times a day:  n/a Blood Thinner/ Instructions /Last Dose:n/a ASA / Instructions/ Last Dose : n/a

## 2019-09-19 ENCOUNTER — Other Ambulatory Visit (HOSPITAL_COMMUNITY)
Admission: RE | Admit: 2019-09-19 | Discharge: 2019-09-19 | Disposition: A | Discharge status: Home / Self Care | Payer: 59 | Source: Ambulatory Visit | Attending: Urology | Admitting: Urology

## 2019-09-19 DIAGNOSIS — Z20822 Contact with and (suspected) exposure to covid-19: Secondary | ICD-10-CM | POA: Insufficient documentation

## 2019-09-19 DIAGNOSIS — Z01812 Encounter for preprocedural laboratory examination: Secondary | ICD-10-CM | POA: Diagnosis present

## 2019-09-19 LAB — SARS CORONAVIRUS 2 (TAT 6-24 HRS): SARS Coronavirus 2: NEGATIVE

## 2019-09-22 ENCOUNTER — Encounter: Payer: Self-pay | Admitting: Internal Medicine

## 2019-09-22 ENCOUNTER — Ambulatory Visit (INDEPENDENT_AMBULATORY_CARE_PROVIDER_SITE_OTHER): Payer: 59 | Admitting: Internal Medicine

## 2019-09-22 ENCOUNTER — Other Ambulatory Visit: Payer: Self-pay

## 2019-09-22 VITALS — BP 107/70 | HR 99 | Temp 97.3°F | Resp 17 | Ht 63.5 in | Wt 238.0 lb

## 2019-09-22 DIAGNOSIS — I10 Essential (primary) hypertension: Secondary | ICD-10-CM | POA: Diagnosis not present

## 2019-09-22 DIAGNOSIS — L723 Sebaceous cyst: Secondary | ICD-10-CM | POA: Diagnosis not present

## 2019-09-22 DIAGNOSIS — L989 Disorder of the skin and subcutaneous tissue, unspecified: Secondary | ICD-10-CM | POA: Diagnosis not present

## 2019-09-22 DIAGNOSIS — R7989 Other specified abnormal findings of blood chemistry: Secondary | ICD-10-CM

## 2019-09-22 MED ORDER — AMLODIPINE BESYLATE 10 MG PO TABS: 10.0000 mg | ORAL_TABLET | Freq: Every day | ORAL | 3 refills | Status: DC

## 2019-09-22 NOTE — Progress Notes (Signed)
Subjective:    Hannah Beard - 64 y.o. female MRN [id number]  Date of birth: 04-11-1955  HPI  Hannah Beard is here for f/u HTN. Amlodipine was started at televisit in July due to concerns about consistently elevated BPs at urologist office and an ED visit with elevated BP. Reports since starting Amlodipine headaches she was having have resolved and she has noticed improvement in BPs.   Chronic HTN Disease Monitoring:  Home BP Monitoring - Yes. Prior to starting Amlodipine had higher trend >160/90s. Now more consistently around 140/80.  Chest pain- no  Dyspnea- no Headache - no  Medications: Amlodipine 5 mg  Compliance- yes Lightheadedness- no  Edema- no        Health Maintenance:  Health Maintenance Due  Topic Date Due   INFLUENZA VACCINE  Never done    -  reports that she quit smoking about 10 years ago. Her smoking use included cigarettes. She quit after 20.00 years of use. She has never used smokeless tobacco. - Review of Systems: Per HPI. - Past Medical History: Patient Active Problem List   Diagnosis Date Noted   Chronic diastolic CHF (congestive heart failure) ([place]) 03/07/2014   ST elevation myocardial infarction (STEMI) of inferior wall ([place]) 02/22/2014   Former smoker    CKD (chronic kidney disease)    HLD (hyperlipidemia)    CAD (coronary artery disease)    Bradycardia 02/21/2014   Acute kidney injury ([place]) 01/60/1093   Acute diastolic CHF (congestive heart failure) ([place]) 02/21/2014   CAD (coronary artery disease), native coronary artery 02/21/2014   - Medications: reviewed and updated   Objective:   Physical Exam BP 107/70    Pulse 99    Temp (!) 97.3 F (36.3 C) (Temporal)    Resp 17    Ht 5' 3.5" (1.613 m)    Wt 238 lb (108 kg)    SpO2 96%    BMI 41.50 kg/m  Physical Exam Constitutional:      General: She is not in acute distress.    Appearance: She is not diaphoretic.  Cardiovascular:     Rate and Rhythm: Normal  rate.  Pulmonary:     Effort: Pulmonary effort is normal. No respiratory distress.  Musculoskeletal:        General: Normal range of motion.  Skin:    General: Skin is warm and dry.     Comments: Left upper lateral arm skin lesion with raised papule and scabbing present.   Neurological:     Mental Status: She is alert and oriented to person, place, and time.  Psychiatric:        Mood and Affect: Affect normal.        Judgment: Judgment normal.            Assessment & Plan:   1. Essential hypertension Initial BP 162/95---patient reports talking during measurement and feeling nervous about bladder procedure tomorrow. Given elevated home readings of >140/80, had planned to increase Amlodipine. Repeat BP significantly improved to 107/70. Will send in Rx for 10 mg tablet. Hold off on starting prior to procedure tomorrow. Monitor at home and if consistently above goal, start higher dose. Return in 6 weeks for BP f/u.  Counseled on blood pressure goal of less than 130/80, low-sodium, DASH diet, medication compliance, 150 minutes of moderate intensity exercise per week. Discussed medication compliance, adverse effects. - amLODipine (NORVASC) 10 MG tablet; Take 1 tablet (10 mg total) by mouth daily.  Dispense: 90 tablet; Refill: 3  2. Skin lesion Patient reports chronic skin lesion of left upper arm >6 months with recurrent scabbing.  - Ambulatory referral to Dermatology  3. Sebaceous cyst - Ambulatory referral to Dermatology  4. Elevated LFTs AST 414> 201 (08/12/19) and ALT 461> 352. RUQ ultrasound showed hepatic steatosis without focal mass and dilated common bile duct 1.5 cm without stone or mass suggestive of mild central intrahepatic ductal dilation. GI referral is pending. Patient asks to have LFTs repeated today.  - Hepatic Function Panel      Phill Myron, D.O. 09/22/2019, 11:14 AM Primary Care at [facility]

## 2019-09-23 ENCOUNTER — Ambulatory Visit (HOSPITAL_BASED_OUTPATIENT_CLINIC_OR_DEPARTMENT_OTHER): Payer: 59 | Admitting: Physician Assistant

## 2019-09-23 ENCOUNTER — Ambulatory Visit (HOSPITAL_BASED_OUTPATIENT_CLINIC_OR_DEPARTMENT_OTHER)
Admission: RE | Admit: 2019-09-23 | Discharge: 2019-09-23 | Disposition: A | Discharge status: Home / Self Care | Payer: 59 | Attending: Urology | Admitting: Urology

## 2019-09-23 ENCOUNTER — Other Ambulatory Visit: Payer: Self-pay

## 2019-09-23 ENCOUNTER — Encounter (HOSPITAL_BASED_OUTPATIENT_CLINIC_OR_DEPARTMENT_OTHER): Payer: Self-pay | Admitting: Urology

## 2019-09-23 ENCOUNTER — Encounter (HOSPITAL_BASED_OUTPATIENT_CLINIC_OR_DEPARTMENT_OTHER)
Admission: RE | Disposition: A | Discharge status: Home / Self Care | Payer: Self-pay | Source: Home / Self Care | Attending: Urology

## 2019-09-23 DIAGNOSIS — I252 Old myocardial infarction: Secondary | ICD-10-CM | POA: Insufficient documentation

## 2019-09-23 DIAGNOSIS — Z79899 Other long term (current) drug therapy: Secondary | ICD-10-CM | POA: Insufficient documentation

## 2019-09-23 DIAGNOSIS — N189 Chronic kidney disease, unspecified: Secondary | ICD-10-CM | POA: Diagnosis not present

## 2019-09-23 DIAGNOSIS — Z8551 Personal history of malignant neoplasm of bladder: Secondary | ICD-10-CM | POA: Insufficient documentation

## 2019-09-23 DIAGNOSIS — I2510 Atherosclerotic heart disease of native coronary artery without angina pectoris: Secondary | ICD-10-CM | POA: Insufficient documentation

## 2019-09-23 DIAGNOSIS — C674 Malignant neoplasm of posterior wall of bladder: Secondary | ICD-10-CM | POA: Diagnosis not present

## 2019-09-23 DIAGNOSIS — Z955 Presence of coronary angioplasty implant and graft: Secondary | ICD-10-CM | POA: Diagnosis not present

## 2019-09-23 DIAGNOSIS — C671 Malignant neoplasm of dome of bladder: Secondary | ICD-10-CM | POA: Insufficient documentation

## 2019-09-23 DIAGNOSIS — I509 Heart failure, unspecified: Secondary | ICD-10-CM | POA: Diagnosis not present

## 2019-09-23 DIAGNOSIS — D494 Neoplasm of unspecified behavior of bladder: Secondary | ICD-10-CM | POA: Diagnosis present

## 2019-09-23 DIAGNOSIS — Z6841 Body Mass Index (BMI) 40.0 and over, adult: Secondary | ICD-10-CM | POA: Diagnosis not present

## 2019-09-23 DIAGNOSIS — N135 Crossing vessel and stricture of ureter without hydronephrosis: Secondary | ICD-10-CM | POA: Insufficient documentation

## 2019-09-23 DIAGNOSIS — Z87891 Personal history of nicotine dependence: Secondary | ICD-10-CM | POA: Insufficient documentation

## 2019-09-23 DIAGNOSIS — I130 Hypertensive heart and chronic kidney disease with heart failure and stage 1 through stage 4 chronic kidney disease, or unspecified chronic kidney disease: Secondary | ICD-10-CM | POA: Insufficient documentation

## 2019-09-23 HISTORY — PX: TRANSURETHRAL RESECTION OF BLADDER TUMOR: SHX2575

## 2019-09-23 LAB — POCT I-STAT, CHEM 8
BUN: 23 mg/dL (ref 8–23)
BUN: 23 mg/dL (ref 8–23)
Calcium, Ion: 1.15 mmol/L (ref 1.15–1.40)
Calcium, Ion: 1.18 mmol/L (ref 1.15–1.40)
Chloride: 106 mmol/L (ref 98–111)
Chloride: 107 mmol/L (ref 98–111)
Creatinine, Ser: 1 mg/dL (ref 0.44–1.00)
Creatinine, Ser: 1 mg/dL (ref 0.44–1.00)
Glucose, Bld: 148 mg/dL — ABNORMAL HIGH (ref 70–99)
Glucose, Bld: 149 mg/dL — ABNORMAL HIGH (ref 70–99)
HCT: 45 % (ref 36.0–46.0)
HCT: 46 % (ref 36.0–46.0)
Hemoglobin: 15.3 g/dL — ABNORMAL HIGH (ref 12.0–15.0)
Hemoglobin: 15.6 g/dL — ABNORMAL HIGH (ref 12.0–15.0)
Potassium: 5.2 mmol/L — ABNORMAL HIGH (ref 3.5–5.1)
Potassium: 5.3 mmol/L — ABNORMAL HIGH (ref 3.5–5.1)
Sodium: 143 mmol/L (ref 135–145)
Sodium: 143 mmol/L (ref 135–145)
TCO2: 26 mmol/L (ref 22–32)
TCO2: 27 mmol/L (ref 22–32)

## 2019-09-23 LAB — HEPATIC FUNCTION PANEL
ALT: 110 IU/L — ABNORMAL HIGH (ref 0–32)
AST: 96 IU/L — ABNORMAL HIGH (ref 0–40)
Albumin: 3.9 g/dL (ref 3.8–4.8)
Alkaline Phosphatase: 353 IU/L — ABNORMAL HIGH (ref 48–121)
Bilirubin Total: 1.2 mg/dL (ref 0.0–1.2)
Bilirubin, Direct: 0.82 mg/dL — ABNORMAL HIGH (ref 0.00–0.40)
Total Protein: 7.7 g/dL (ref 6.0–8.5)

## 2019-09-23 SURGERY — TURBT (TRANSURETHRAL RESECTION OF BLADDER TUMOR)
Anesthesia: General | Site: Bladder

## 2019-09-23 MED ORDER — FENTANYL CITRATE (PF) 100 MCG/2ML IJ SOLN
INTRAMUSCULAR | Status: AC
  Filled 2019-09-23: qty 2

## 2019-09-23 MED ORDER — DEXMEDETOMIDINE HCL 200 MCG/2ML IV SOLN
INTRAVENOUS | Status: DC | PRN
  Administered 2019-09-23: 8 ug via INTRAVENOUS
  Administered 2019-09-23: 4 ug via INTRAVENOUS

## 2019-09-23 MED ORDER — PHENAZOPYRIDINE HCL 200 MG PO TABS: 200.0000 mg | ORAL_TABLET | Freq: Three times a day (TID) | ORAL | 0 refills | Status: DC | PRN

## 2019-09-23 MED ORDER — LABETALOL HCL 5 MG/ML IV SOLN
INTRAVENOUS | Status: AC
  Filled 2019-09-23: qty 4

## 2019-09-23 MED ORDER — DEXAMETHASONE SODIUM PHOSPHATE 4 MG/ML IJ SOLN
INTRAMUSCULAR | Status: DC | PRN
  Administered 2019-09-23: 6 mg via INTRAVENOUS

## 2019-09-23 MED ORDER — SODIUM CHLORIDE 0.9 % IR SOLN
Status: DC | PRN
  Administered 2019-09-23: 3000 mL
  Administered 2019-09-23: 1000 mL
  Administered 2019-09-23: 3000 mL

## 2019-09-23 MED ORDER — PHENYLEPHRINE 40 MCG/ML (10ML) SYRINGE FOR IV PUSH (FOR BLOOD PRESSURE SUPPORT)
PREFILLED_SYRINGE | INTRAVENOUS | Status: AC
  Filled 2019-09-23: qty 10

## 2019-09-23 MED ORDER — PROPOFOL 10 MG/ML IV BOLUS
INTRAVENOUS | Status: DC | PRN
  Administered 2019-09-23: 50 mg via INTRAVENOUS
  Administered 2019-09-23: 200 mg via INTRAVENOUS

## 2019-09-23 MED ORDER — LACTATED RINGERS IV SOLN: INTRAVENOUS | Status: DC

## 2019-09-23 MED ORDER — FENTANYL CITRATE (PF) 100 MCG/2ML IJ SOLN
INTRAMUSCULAR | Status: DC | PRN
Start: 2019-09-23 — End: 2019-09-23
  Administered 2019-09-23 (×2): 50 ug via INTRAVENOUS

## 2019-09-23 MED ORDER — CEFAZOLIN SODIUM-DEXTROSE 2-4 GM/100ML-% IV SOLN
INTRAVENOUS | Status: AC
  Filled 2019-09-23: qty 100

## 2019-09-23 MED ORDER — PHENYLEPHRINE 40 MCG/ML (10ML) SYRINGE FOR IV PUSH (FOR BLOOD PRESSURE SUPPORT)
PREFILLED_SYRINGE | INTRAVENOUS | Status: DC | PRN
  Administered 2019-09-23 (×2): 80 ug via INTRAVENOUS

## 2019-09-23 MED ORDER — ACETAMINOPHEN 500 MG PO TABS: 1000.0000 mg | ORAL_TABLET | Freq: Once | ORAL | Status: DC

## 2019-09-23 MED ORDER — CEFAZOLIN SODIUM-DEXTROSE 2-4 GM/100ML-% IV SOLN
2.0000 g | Freq: Once | INTRAVENOUS | Status: AC
  Administered 2019-09-23: 2 g via INTRAVENOUS

## 2019-09-23 MED ORDER — GEMCITABINE CHEMO FOR BLADDER INSTILLATION 2000 MG
2000.0000 mg | Freq: Once | INTRAVENOUS | Status: AC
  Administered 2019-09-23: 2000 mg via INTRAVESICAL
  Filled 2019-09-23: qty 2000

## 2019-09-23 MED ORDER — ONDANSETRON HCL 4 MG/2ML IJ SOLN
INTRAMUSCULAR | Status: DC | PRN
  Administered 2019-09-23: 4 mg via INTRAVENOUS

## 2019-09-23 MED ORDER — MIDAZOLAM HCL 2 MG/2ML IJ SOLN
INTRAMUSCULAR | Status: AC
  Filled 2019-09-23: qty 2

## 2019-09-23 MED ORDER — OXYCODONE HCL 5 MG PO TABS: 5.0000 mg | ORAL_TABLET | ORAL | 0 refills | Status: DC | PRN

## 2019-09-23 MED ORDER — LABETALOL HCL 5 MG/ML IV SOLN
5.0000 mg | Freq: Once | INTRAVENOUS | Status: AC
  Administered 2019-09-23: 5 mg via INTRAVENOUS

## 2019-09-23 MED ORDER — MIDAZOLAM HCL 5 MG/5ML IJ SOLN
INTRAMUSCULAR | Status: DC | PRN
  Administered 2019-09-23: 2 mg via INTRAVENOUS

## 2019-09-23 MED ORDER — IOHEXOL 300 MG/ML  SOLN
INTRAMUSCULAR | Status: DC | PRN
  Administered 2019-09-23: 9 mL via URETHRAL

## 2019-09-23 MED ORDER — LIDOCAINE 2% (20 MG/ML) 5 ML SYRINGE
INTRAMUSCULAR | Status: DC | PRN
  Administered 2019-09-23: 60 mg via INTRAVENOUS
  Administered 2019-09-23: 40 mg via INTRAVENOUS

## 2019-09-23 MED ORDER — ONDANSETRON HCL 4 MG/2ML IJ SOLN
INTRAMUSCULAR | Status: AC
  Filled 2019-09-23: qty 2

## 2019-09-23 MED ORDER — LIDOCAINE 2% (20 MG/ML) 5 ML SYRINGE
INTRAMUSCULAR | Status: AC
  Filled 2019-09-23: qty 5

## 2019-09-23 MED ORDER — DEXAMETHASONE SODIUM PHOSPHATE 10 MG/ML IJ SOLN
INTRAMUSCULAR | Status: AC
  Filled 2019-09-23: qty 1

## 2019-09-23 MED ORDER — CEPHALEXIN 500 MG PO CAPS: 500.0000 mg | ORAL_CAPSULE | Freq: Two times a day (BID) | ORAL | 0 refills | Status: AC

## 2019-09-23 MED ORDER — ACETAMINOPHEN 500 MG PO TABS
ORAL_TABLET | ORAL | Status: AC
  Filled 2019-09-23: qty 2

## 2019-09-23 MED ORDER — FENTANYL CITRATE (PF) 100 MCG/2ML IJ SOLN
25.0000 ug | INTRAMUSCULAR | Status: DC | PRN
  Administered 2019-09-23: 25 ug via INTRAVENOUS

## 2019-09-23 SURGICAL SUPPLY — 30 items
BAG DRAIN URO-CYSTO SKYTR STRL (DRAIN) ×3 IMPLANT
BAG DRN RND TRDRP ANRFLXCHMBR (UROLOGICAL SUPPLIES) ×1
BAG DRN UROCATH (DRAIN) ×1
BAG URINE DRAIN 2000ML AR STRL (UROLOGICAL SUPPLIES) ×3 IMPLANT
BAG URINE LEG 500ML (DRAIN) IMPLANT
CATH FOLEY 2WAY SLVR  5CC 18FR (CATHETERS) ×3
CATH FOLEY 2WAY SLVR 5CC 18FR (CATHETERS) ×1 IMPLANT
CATH URET 5FR 28IN OPEN ENDED (CATHETERS) ×3 IMPLANT
GLOVE BIO SURGEON STRL SZ7.5 (GLOVE) ×3 IMPLANT
GLOVE BIOGEL PI IND STRL 7.0 (GLOVE) ×2 IMPLANT
GLOVE BIOGEL PI INDICATOR 7.0 (GLOVE) ×4
GOWN STRL REUS W/TWL LRG LVL3 (GOWN DISPOSABLE) ×3 IMPLANT
GOWN STRL REUS W/TWL XL LVL3 (GOWN DISPOSABLE) ×3 IMPLANT
GUIDEWIRE ANG ZIPWIRE 038X150 (WIRE) ×3 IMPLANT
GUIDEWIRE ZIPWRE .038 STRAIGHT (WIRE) ×3 IMPLANT
HOLDER FOLEY CATH W/STRAP (MISCELLANEOUS) ×3 IMPLANT
IV NS IRRIG 3000ML ARTHROMATIC (IV SOLUTION) ×9 IMPLANT
LOOP CUT BIPOLAR 24F LRG (ELECTROSURGICAL) IMPLANT
MANIFOLD NEPTUNE II (INSTRUMENTS) ×3 IMPLANT
NS IRRIG 500ML POUR BTL (IV SOLUTION) ×3 IMPLANT
PACK CYSTO (CUSTOM PROCEDURE TRAY) ×3 IMPLANT
STENT URET 6FRX24 CONTOUR (STENTS) ×3 IMPLANT
SYR CONTROL 10ML LL (SYRINGE) ×3 IMPLANT
SYR TOOMEY IRRIG 70ML (MISCELLANEOUS) ×3
SYRINGE TOOMEY IRRIG 70ML (MISCELLANEOUS) ×1 IMPLANT
TUBE CONNECTING 12'X1/4 (SUCTIONS) ×1
TUBE CONNECTING 12X1/4 (SUCTIONS) ×2 IMPLANT
TUBING UROLOGY SET (TUBING) ×3 IMPLANT
WATER STERILE IRR 3000ML UROMA (IV SOLUTION) IMPLANT
WATER STERILE IRR 500ML POUR (IV SOLUTION) ×3 IMPLANT

## 2019-09-23 NOTE — Discharge Instructions (Signed)
Post Anesthesia Home Care Instructions  Activity: Get plenty of rest for the remainder of the day. A responsible individual must stay with you for 24 hours following the procedure.  For the next 24 hours, DO NOT: -Drive a car -Operate machinery -Drink alcoholic beverages -Take any medication unless instructed by your physician -Make any legal decisions or sign important papers.  Meals: Start with liquid foods such as gelatin or soup. Progress to regular foods as tolerated. Avoid greasy, spicy, heavy foods. If nausea and/or vomiting occur, drink only clear liquids until the nausea and/or vomiting subsides. Call your physician if vomiting continues.  Special Instructions/Symptoms: Your throat may feel dry or sore from the anesthesia or the breathing tube placed in your throat during surgery. If this causes discomfort, gargle with warm salt water. The discomfort should disappear within 24 hours.   [facility] [phone] Post Ureteroscopy With Stent Instructions  Definitions:  Ureter: The duct that transports urine from the kidney to the bladder. Stent:   A plastic hollow tube that is placed into the ureter, from the kidney to the bladder to prevent the ureter from swelling shut.  GENERAL INSTRUCTIONS:  Despite the fact that no skin incisions were used, the area around the ureter and bladder is raw and irritated. The stent is a foreign body which will further irritate the bladder wall. This irritation is manifested by increased frequency of urination, both day and night, and by an increase in the urge to urinate. In some, the urge to urinate is present almost always. Sometimes the urge is strong enough that you may not be able to stop yourself from urinating. The only real cure is to remove the stent and then give time for the bladder wall to heal which can't be done until the danger of the ureter swelling shut has passed, which varies.  You may see some blood in your  urine while the stent is in place and a few days afterwards. Do not be alarmed, even if the urine was clear for a while. Get off your feet and drink lots of fluids until clearing occurs. If you start to pass clots or don't improve, call us.  DIET: You may return to your normal diet immediately. Because of the raw surface of your bladder, alcohol, spicy foods, acid type foods and drinks with caffeine may cause irritation or frequency and should be used in moderation. To keep your urine flowing freely and to avoid constipation, drink plenty of fluids during the day ( 8-10 glasses ). Tip: Avoid cranberry juice because it is very acidic.  ACTIVITY: Your physical activity doesn't need to be restricted. However, if you are very active, you may see some blood in your urine. We suggest that you reduce your activity under these circumstances until the bleeding has stopped.  BOWELS: It is important to keep your bowels regular during the postoperative period. Straining with bowel movements can cause bleeding. A bowel movement every other day is reasonable. Use a mild laxative if needed, such as Milk of Magnesia 2-3 tablespoons, or 2 Dulcolax tablets. Call if you continue to have problems. If you have been taking narcotics for pain, before, during or after your surgery, you may be constipated. Take a laxative if necessary.   MEDICATION: You should resume your pre-surgery medications unless told not to. In addition you will often be given an antibiotic to prevent infection. These should be taken as prescribed until the bottles are finished unless you are having an unusual   reaction to one of the drugs.  PROBLEMS YOU SHOULD REPORT TO US: Fevers over 100.5 Fahrenheit. Heavy bleeding, or clots ( See above notes about blood in urine ). Inability to urinate. Drug reactions ( hives, rash, nausea, vomiting, diarrhea ). Severe burning or pain with urination that is not improving.  FOLLOW-UP: You will need a  follow-up appointment to monitor your progress. Call for this appointment at the number listed above. Usually the first appointment will be about three to fourteen days after your surgery.        

## 2019-09-23 NOTE — H&P (Signed)
PRE-OP H&P  Office Visit Report     09/11/2019   --------------------------------------------------------------------------------   Hannah Beard  MRN: [id number]  DOB: [birth year], 64 year old Female  SSN:    PRIMARY CARE:    REFERRING:    PROVIDER:  Ellison Hughs, M.D.  LOCATION:  [facility] [phone]     --------------------------------------------------------------------------------   CC/HPI: CC: Bladder cancer   HPI: Hannah Beard is a 64 year old female with a 2 year history of intermittent episodes of painless gross hematuria. PMHx of tobacco use, CAD, STEMI (s/p DES in 2016), HTN, HLD, CKD and obesity. 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 She underwent repeat TURBT and right stent exchange on 04/29/19--pathology showed no residual tumor or signs of detrusor involvement. She completed induction BCG on 07/24/19.   04/13/19: The patient is here today for a routine follow-up. She has done well following surgery and has no urinary complaints. Denies interval UTIs, dysuria or hematuria.   05/14/19: Hannah Beard is here today for a routine follow-up. The patient underwent a repeat TURBT and right stent exchange on 04/29/2019. Pathology showed no evidence of tumor recurrence.   06/08/19: The patient is here today for a routine follow-up and RUS. She is doing well and denies interval episodes of flank pain, dysuria or hematuria.   09/11/19: The patient is here today for a routine follow-up and surveillance cystoscopy. He recently completed induction BCG and tolerated treatments well. Denies interval UTIs, dysuria or hematuria.     ALLERGIES: No Allergies    MEDICATIONS: Tamsulosin Hcl 0.4 mg capsule 1 capsule PO Daily  Amlodipine Besylate 5 mg tablet  Vitamin C  Vitamin D3     GU PSH: Bladder Instill AntiCA Agent - 07/24/2019, 07/17/2019, 07/10/2019, 07/03/2019,  06/26/2019, 06/19/2019, 03/18/2019 Cysto Bladder Ureth Biopsy - 04/29/2019 Cysto Remove Stent FB Sim - 05/14/2019 Cystoscopy Insert Stent, Right - 04/29/2019, Right - 03/18/2019 Cystoscopy TURBT >5 cm - 03/18/2019 Locm 300-399Mg /Ml Iodine,1Ml - 03/13/2019       PSH Notes: chest tubes for pneumonia    NON-GU PSH: Bilateral Tubal Ligation Insert/place Heart Cath     GU PMH: Hydronephrosis - 05/14/2019 Bladder Cancer overlapping sites, High Grade T1 UCC - 04/13/2019 Gross hematuria - 02/27/2019 Urinary Hesitancy - 02/27/2019    NON-GU PMH: Encounter for antineoplastic chemotherapy - 06/19/2019 Myocardial Infarction    FAMILY HISTORY: Heart Disease - Runs in Family liver cancer - Grandmother Lung Cancer - Mother   SOCIAL HISTORY: Marital Status: Divorced Preferred Language: English; Race: White Current Smoking Status: Patient does not smoke anymore.   Tobacco Use Assessment Completed: Used Tobacco in last 30 days? Drinks 2 drinks per month.  Drinks 3 caffeinated drinks per day.    REVIEW OF SYSTEMS:    GU Review Female:   Patient denies frequent urination, hard to postpone urination, burning /pain with urination, get up at night to urinate, leakage of urine, stream starts and stops, trouble starting your stream, have to strain to urinate, and being pregnant.  Gastrointestinal (Upper):   Patient denies nausea, vomiting, and indigestion/ heartburn.  Gastrointestinal (Lower):   Patient denies diarrhea and constipation.  Constitutional:   Patient denies fever, night sweats, weight loss, and fatigue.  Skin:   Patient denies itching and skin rash/ lesion.  Eyes:   Patient denies blurred vision and double vision.  Ears/ Nose/ Throat:   Patient denies sore throat and  sinus problems.  Hematologic/Lymphatic:   Patient denies swollen glands and easy bruising.  Cardiovascular:   Patient reports leg swelling. Patient denies chest pains.  Respiratory:   Patient denies cough and shortness of breath.   Endocrine:   Patient denies excessive thirst.  Musculoskeletal:   Patient reports back pain. Patient denies joint pain.  Neurological:   Patient reports dizziness. Patient denies headaches.  Psychologic:   Patient denies depression and anxiety.   VITAL SIGNS:      09/11/2019 11:13 AM  Weight 242 lb / 109.77 kg  Height 64 in / 162.56 cm  BP 144/87 mmHg  Heart Rate 111 /min  Temperature 98.7 F / 37.0 C  BMI 41.5 kg/m   GU PHYSICAL EXAMINATION:    External Genitalia: No hirsutism, no rash, no scarring, no cyst, no erythematous lesion, no papular lesion, no blanched lesion, no warty lesion. No edema.  Urethral Meatus: Normal size. Normal position. No discharge.  Urethra: No tenderness, no mass, no scarring. No hypermobility. No leakage.  Bladder: Normal to palpation, no tenderness, no mass, normal size.   MULTI-SYSTEM PHYSICAL EXAMINATION:    Constitutional: Well-nourished. No physical deformities. Normally developed. Good grooming.  Neurologic / Psychiatric: Oriented to time, oriented to place, oriented to person. No depression, no anxiety, no agitation.     PAST DATA REVIEW: None   PROCEDURES:         Flexible Cystoscopy - 52000  Risks, benefits, and some of the potential complications of the procedure were discussed at length with the patient including infection, bleeding, voiding discomfort, urinary retention, fever, chills, sepsis, and others. All questions were answered. Informed consent was obtained. Antibiotic prophylaxis was given. Sterile technique and intraurethral analgesia were used.  Meatus:  Normal size. Normal location. Normal condition.  Urethra:  No hypermobility. No leakage.  Ureteral Orifices:  Normal location. Normal size. Normal shape. Effluxed clear urine.  Bladder:  A few posterior wall tumors. 1 cm tumor. No trabeculation. Normal mucosa. No stones.      The lower urinary tract was carefully examined. The procedure was well-tolerated and without  complications. Antibiotic instructions were given. Instructions were given to call the office immediately for bloody urine, difficulty urinating, urinary retention, painful or frequent urination, fever, chills, nausea, vomiting or other illness. The patient stated that she understood these instructions and would comply with them.         Urinalysis w/Scope Dipstick Dipstick Cont'd Micro  Color: Yellow Bilirubin: Neg mg/dL WBC/hpf: 0 - 5/hpf  Appearance: Clear Ketones: Neg mg/dL RBC/hpf: NS (Not Seen)  Specific Gravity: 1.015 Blood: Neg ery/uL Bacteria: NS (Not Seen)  pH: 5.5 Protein: Neg mg/dL Cystals: NS (Not Seen)  Glucose: Neg mg/dL Urobilinogen: 0.2 mg/dL Casts: NS (Not Seen)    Nitrites: Neg Trichomonas: Not Present    Leukocyte Esterase: Trace leu/uL Mucous: Not Present      Epithelial Cells: NS (Not Seen)      Yeast: NS (Not Seen)      Sperm: Not Present    ASSESSMENT:      ICD-10 Details  1 GU:   Bladder Cancer overlapping sites - C67.8 Chronic, Exacerbation   PLAN:           Orders Labs Urine Culture          Schedule Return Visit/Planned Activity: ASAP - Schedule Surgery          Document Letter(s):  Created for Patient: Clinical Summary         Notes:   -  Cystoscopy today revealed 3 papillary bladder tumors measuring ~1 cm involving the right posterior bladder wall.  The risks, benefits and alternatives of cystoscopy with TURBT with gemcitabine instillation was discussed with the patient. The risks include, but are not limited to, bleeding, urinary tract infection, bladder perforation requiring prolonged catheterization and/or open bladder repair, ureteral obstruction, voiding dysfunction and the inherent risks of general anesthesia. The patient voices understanding and wishes to proceed.

## 2019-09-23 NOTE — Anesthesia Procedure Notes (Signed)
Procedure Name: LMA Insertion Date/Time: 09/23/2019 12:26 PM Performed by: Lieutenant Diego, CRNA Pre-anesthesia Checklist: Patient identified, Emergency Drugs available, Suction available and Patient being monitored Patient Re-evaluated:Patient Re-evaluated prior to induction Oxygen Delivery Method: Circle system utilized Preoxygenation: Pre-oxygenation with 100% oxygen Induction Type: IV induction Ventilation: Mask ventilation without difficulty LMA: LMA inserted LMA Size: 4.0 Number of attempts: 1 Placement Confirmation: positive ETCO2 and breath sounds checked- equal and bilateral Tube secured with: Tape Dental Injury: Teeth and Oropharynx as per pre-operative assessment

## 2019-09-23 NOTE — Transfer of Care (Signed)
Immediate Anesthesia Transfer of Care Note  Patient: Hannah Beard  Procedure(s) Performed: TRANSURETHRAL RESECTION OF BLADDER TUMOR (TURBT)/ CYSTOSCOPY/ INSTILLATION OF GEMCITABINE (N/A Bladder)  Patient Location: PACU  Anesthesia Type:General  Level of Consciousness: drowsy  Airway & Oxygen Therapy: Patient Spontanous Breathing and Patient connected to face mask oxygen  Post-op Assessment: Report given to RN and Post -op Vital signs reviewed and stable  Post vital signs: Reviewed and stable  Last Vitals:  Vitals Value Taken Time  BP 144/72 09/23/19 1330  Temp    Pulse 80 09/23/19 1332  Resp 21 09/23/19 1332  SpO2 96 % 09/23/19 1332  Vitals shown include unvalidated device data.  Last Pain:  Vitals:   09/23/19 1037  TempSrc: Oral  PainSc: 0-No pain      Patients Stated Pain Goal: 5 (83/77/93 9688)  Complications: No complications documented.

## 2019-09-23 NOTE — Op Note (Addendum)
Operative Note  Preoperative diagnosis:  1.  Recurrence of three 1 cm papillary bladder tumors involving the posterior bladder wall and bladder dome 2.  History of high-grade T1 urothelial carcinoma the bladder  Postoperative diagnosis: 1.  Recurrence of three 1 cm papillary bladder tumors involving the posterior bladder wall and bladder dome 2.  History of high-grade T1 urothelial carcinoma the bladder 3.  Obstructed right ureteral orifice from previous resection site  Procedure(s): 1.  Cystoscopy with TURBT (small) and resection of right ureteral orifice 2.  Right JJ stent placement 3.  Right retrograde pyelogram with intraoperative interpretation of fluoroscopic imaging 4.  Intravesical instillation of gemcitabine  Surgeon: Ellison Hughs, MD  Assistants:  None  Anesthesia:  General  Complications:  None  EBL: Less than 5 mL  Specimens: 1.  Superficial and deep margins were taken of the posterior bladder wall tumors as well as the  bladder dome tumor  Drains/Catheters: 1.  6 French, 24 cm JJ stent without tether  Intraoperative findings:   1. A total of 3 papillary bladder tumors were seen.  2 involving the posterior bladder wall and one involving the bladder dome 2. The right ureteral orifice was occluded by scar from her previous bladder tumor resection site.  The right ureteral orifice was resected and identified 3. Right retrograde pyelogram revealed mild dilation of the distal aspects of the right ureter without proximal evidence of hydronephrosis.  No filling defects were seen along the upper urinary tract.  Indication:  Hannah Beard is a 64 y.o. female with a history of high-grade T1 urothelial carcinoma immediately adjacent to the right ureteral orifice, status post initial TURBT on 03/18/2019.  Surveillance cystoscopy on 09/11/2019 revealed multiple papillary bladder tumors involving the posterior bladder wall and anterior bladder dome.  She has been consented  for the above procedures, voices understanding and wishes to proceed.  Description of procedure:  After informed consent was obtained, the patient was brought to the operating room and general LMA anesthesia was administered. The patient was then placed in the dorsolithotomy position and prepped and draped in the usual sterile fashion. A timeout was performed. A 23 French rigid cystoscope was then inserted into the urethral meatus and advanced into the bladder under direct vision. A complete bladder survey revealed the findings listed above.  The cystoscope was then exchanged for a 26 French resectoscope with a bipolar loop working element.  The tumors involving the posterior bladder wall and bladder dome were then resected superficially then deep, with the specimen being sent for permanent section.  The area of resection was then extensively fulgurated until hemostasis was achieved.  There was a large scar from her previous resection site that appeared to be occluding the right ureteral orifice as it could not be identified.  The bipolar loop was then used to carefully resect the previous resection scar until the orifice was identified.  A Glidewire was then advanced into the right ureteral orifice and up to the right renal pelvis, or fluoroscopic guidance.  A 5 French ureteral catheter was then inserted over the wire and into the distal aspects of the right ureter.  A right retrograde pyelogram was obtained, with the findings listed above.  The Glidewire was then replaced.  A 6 French, 24 cm JJ stent was then placed over the wire and into good position within the right collecting system, confirming placement via fluoroscopy.  Reinspection of the bladder revealed all obvious tumor had been fully resected and there was no  evidence of perforation.   An 62 French Foley catheter was then inserted in the bladder and irrigated. The irrigant returned slightly pink with no clots. The patient was awakened and  taken to the recovery room.  While in the recovery room 2000 mg of gemcitabine in 50 mL of water was instilled in the bladder through the catheter and the catheter was plugged. This will remain indwelling for approximately one hour. It will then be drained from the bladder and the catheter will be removed and the patient discharged home.   Plan: Follow-up on 10/09/2019 to discuss pathology results and to possibly remove her right JJ stent

## 2019-09-23 NOTE — Anesthesia Preprocedure Evaluation (Addendum)
Anesthesia Evaluation  Patient identified by MRN, date of birth, ID band Patient awake    Reviewed: Allergy & Precautions, NPO status , Patient's Chart, lab work & pertinent test results  Airway Mallampati: II  TM Distance: >3 FB Neck ROM: Full    Dental  (+) Dental Advisory Given, Poor Dentition, Chipped,    Pulmonary neg pulmonary ROS, former smoker,    Pulmonary exam normal breath sounds clear to auscultation       Cardiovascular hypertension, + CAD, + Past MI, + Cardiac Stents (2006) and +CHF  Normal cardiovascular exam Rhythm:Regular Rate:Normal  TTE 2016 - Normal LV size with mild LV hypertrophy. EF 55%. There was basal to mid inferior and inferolateral severe hypokinesis. Moderate diastolic dysfunction. Normal RV size and systolic function. Valves ok    Neuro/Psych negative neurological ROS  negative psych ROS   GI/Hepatic negative GI ROS, Neg liver ROS, Seen in ED on 08/12/19 w/ nausea and elevated LFTs - now downtrending. RUQ u/s with hepatic steatosis without focal mass and dilated common bile duct 1.5 cm without stone or mass   Endo/Other  Morbid obesity (BMI 42)  Renal/GU negative Renal ROS  negative genitourinary   Musculoskeletal negative musculoskeletal ROS (+)   Abdominal   Peds  Hematology negative hematology ROS (+)   Anesthesia Other Findings Bladder CA  Reproductive/Obstetrics                            Anesthesia Physical Anesthesia Plan  ASA: III  Anesthesia Plan: General   Post-op Pain Management:    Induction: Intravenous  PONV Risk Score and Plan: 3 and Ondansetron, Dexamethasone and Midazolam  Airway Management Planned: LMA  Additional Equipment:   Intra-op Plan:   Post-operative Plan: Extubation in OR  Informed Consent: I have reviewed the patients History and Physical, chart, labs and discussed the procedure including the risks, benefits and  alternatives for the proposed anesthesia with the patient or authorized representative who has indicated his/her understanding and acceptance.     Dental advisory given  Plan Discussed with: CRNA  Anesthesia Plan Comments:         Anesthesia Quick Evaluation

## 2019-09-23 NOTE — Anesthesia Postprocedure Evaluation (Signed)
Anesthesia Post Note  Patient: Hannah Beard  Procedure(s) Performed: Cystoscopy with TURBT (small) and resection of right ureteral orifice, Right JJ stent placement; Right retrograde pyelogram with intraoperative interpretation of fluoroscopic imaging post operative instillation of gemcitabine (N/A Bladder)     Patient location during evaluation: PACU Anesthesia Type: General Level of consciousness: awake and alert Pain management: pain level controlled Vital Signs Assessment: post-procedure vital signs reviewed and stable Respiratory status: spontaneous breathing, nonlabored ventilation, respiratory function stable and patient connected to nasal cannula oxygen Cardiovascular status: blood pressure returned to baseline and stable Postop Assessment: no apparent nausea or vomiting Anesthetic complications: no   No complications documented.  Last Vitals:  Vitals:   09/23/19 1515 09/23/19 1545  BP: (!) 177/83 (!) 156/82  Pulse: 84   Resp: 10 12  Temp:  36.8 C  SpO2: 93% 95%    Last Pain:  Vitals:   09/23/19 1545  TempSrc:   PainSc: 0-No pain   Pain Goal: Patients Stated Pain Goal: 5 (09/23/19 1037)                 Jay

## 2019-09-24 ENCOUNTER — Encounter (HOSPITAL_BASED_OUTPATIENT_CLINIC_OR_DEPARTMENT_OTHER): Payer: Self-pay | Admitting: Urology

## 2019-09-24 LAB — SURGICAL PATHOLOGY

## 2019-10-27 ENCOUNTER — Other Ambulatory Visit (INDEPENDENT_AMBULATORY_CARE_PROVIDER_SITE_OTHER): Payer: 59

## 2019-10-27 ENCOUNTER — Encounter: Payer: Self-pay | Admitting: Gastroenterology

## 2019-10-27 ENCOUNTER — Ambulatory Visit (INDEPENDENT_AMBULATORY_CARE_PROVIDER_SITE_OTHER): Payer: 59 | Admitting: Gastroenterology

## 2019-10-27 VITALS — BP 114/64 | HR 92 | Ht 63.5 in | Wt 242.0 lb

## 2019-10-27 DIAGNOSIS — R945 Abnormal results of liver function studies: Secondary | ICD-10-CM

## 2019-10-27 DIAGNOSIS — R1114 Bilious vomiting: Secondary | ICD-10-CM

## 2019-10-27 DIAGNOSIS — R932 Abnormal findings on diagnostic imaging of liver and biliary tract: Secondary | ICD-10-CM | POA: Diagnosis not present

## 2019-10-27 DIAGNOSIS — R7989 Other specified abnormal findings of blood chemistry: Secondary | ICD-10-CM

## 2019-10-27 DIAGNOSIS — K838 Other specified diseases of biliary tract: Secondary | ICD-10-CM | POA: Diagnosis not present

## 2019-10-27 LAB — PROTIME-INR
INR: 0.9 ratio (ref 0.8–1.0)
Prothrombin Time: 10.6 s (ref 9.6–13.1)

## 2019-10-27 LAB — COMPREHENSIVE METABOLIC PANEL
ALT: 40 U/L — ABNORMAL HIGH (ref 0–35)
AST: 36 U/L (ref 0–37)
Albumin: 4.1 g/dL (ref 3.5–5.2)
Alkaline Phosphatase: 167 U/L — ABNORMAL HIGH (ref 39–117)
BUN: 18 mg/dL (ref 6–23)
CO2: 27 mEq/L (ref 19–32)
Calcium: 9.6 mg/dL (ref 8.4–10.5)
Chloride: 105 mEq/L (ref 96–112)
Creatinine, Ser: 1.08 mg/dL (ref 0.40–1.20)
GFR: 54.41 mL/min — ABNORMAL LOW (ref >60.00)
Glucose, Bld: 139 mg/dL — ABNORMAL HIGH (ref 70–99)
Potassium: 4 mEq/L (ref 3.5–5.1)
Sodium: 141 mEq/L (ref 135–145)
Total Bilirubin: 0.5 mg/dL (ref 0.2–1.2)
Total Protein: 8.2 g/dL (ref 6.0–8.3)

## 2019-10-27 MED ORDER — SUPREP BOWEL PREP KIT 17.5-3.13-1.6 GM/177ML PO SOLN: 1.0000 | ORAL | 0 refills | Status: DC

## 2019-10-27 NOTE — Progress Notes (Signed)
[facility] VISIT   Primary Care Provider Nicolette Bang, DO [place] [address] [address] [phone]  Referring Provider Nicolette Bang, DO [address],  [place] [address] [phone]  Patient Profile: Hannah Beard is a 64 y.o. female with a pmh significant for ischemic cardiomyopathy with prior PCI intervention, hypertension, hyperlipidemia, prediabetes, bladder cancer (currently undergoing BCG treatments).  The patient presents to the [facility] for an evaluation and management of problem(s) noted below:  Problem List 1. Abnormal LFTs   2. History of bilious vomiting with nausea   3. Dilated bile duct   4. Abnormal ultrasound of liver     History of Present Illness This is the patient's first visit to the outpatient [facility].  The patient states that back in July she presented with significant issues starting on a Friday evening after eating spicy [place] food that progressed over the early morning of Saturday and into Sunday.  She had significant nausea with vomiting.  The vomitus became bilious in nature..  She had discomfort as well.  She ended up going to the emergency room on 17 July where she underwent ultrasound imaging that showed results as below and is found to have abnormal LFTs.  She was discharged.  Since July, she has done well and has not had recurrence of her symptoms.  She has never had anything like this previously.  The patient denies any issues with jaundice, scleral icterus, pruritus, darkened/amber urine, clay-colored stools, LE edema, hematemesis, coffee-ground emesis, abdominal distention, confusion.  The patient has normal bowel habits.  The patient does not take significant nonsteroidals or BC/Goody powders.  She has never had an upper or lower endoscopy.  The patient has had periodic LFTs checked and they are slowly downtrending.  She has her next and last round  of intravesicular BCG to be done in December by [facility].  GI Review of Systems Positive as above Negative for pyrosis, dysphagia, odynophagia, pain, decreased appetite, early satiety, change in bowel habits, melena, hematochezia  Review of Systems General: Denies fevers/chills/weight loss unintentionally HEENT: Denies oral lesions Cardiovascular: Denies chest pain/palpitations Pulmonary: Denies shortness of breath Gastroenterological: See HPI Genitourinary: Denies darkened urine Hematological: Denies easy bruising/bleeding Endocrine: Denies temperature intolerance Dermatological: Denies jaundice Psychological: Mood is stable   Medications Current Outpatient Medications  Medication Sig Dispense Refill  . amLODipine (NORVASC) 10 MG tablet Take 1 tablet (10 mg total) by mouth daily. 90 tablet 3  . Ascorbic Acid (VITAMIN C CR) 500 MG CPCR Take 1 capsule by mouth 3 (three) times daily.    Marland Kitchen atorvastatin (LIPITOR) 80 MG tablet Take 1 tablet (80 mg total) by mouth daily. (Patient taking differently: Take 80 mg by mouth every evening. ) 90 tablet 3  . Cholecalciferol (VITAMIN D3) 25 MCG (1000 UT) CAPS Take 1 capsule by mouth daily.    Marland Kitchen triamcinolone cream (KENALOG) 0.1 % Apply 1 application topically 2 (two) times daily. (Patient taking differently: Apply 1 application topically as needed. ) 453.6 g 0  . Na Sulfate-K Sulfate-Mg Sulf (SUPREP BOWEL PREP KIT) 17.5-3.13-1.6 GM/177ML SOLN Take 1 kit by mouth as directed. For colonoscopy prep 354 mL 0   No current facility-administered medications for this visit.    Allergies No Known Allergies  Histories Past Medical History:  Diagnosis Date  . (HFpEF) heart failure with preserved ejection fraction ([place]) 02/2014   volume excess in setting of STEMI and volume resuscitation (EF preserved) post cardiac  cath;  last echo 02-20-2014  ef 55-60%. G2DD  . Bladder cancer ([place])   . CAD (coronary artery disease) previous cardiologist Dr  Burt Knack, lov note in epic 04-28-2014  (03-13-2019  per pt has not seen any doctor in approx. 5 yrs ago, but denies any cardiac s&s)   a. 02/19/14 inf STEMI s/p DES to LCx and moderate stenosis midRCA  . Eczema   . History of pleural empyema    left side  08-30-2009 s/p  left VATS w/ drainage/ decortication  . History of ST elevation myocardial infarction (STEMI) 02/19/2014  . HLD (hyperlipidemia)   . Hypertension    03-13-2019  was on medication 5 yrs ago but none since this time due to not having seen any doctor for 5 yrs due to insurance issue's  . Pre-diabetes    watches diet  . S/P drug eluting coronary stent placement    02-20-2004   PCI and DES x1 to LCx  . Wears glasses    Past Surgical History:  Procedure Laterality Date  . CYSTOSCOPY WITH BIOPSY N/A 04/29/2019   Procedure: CYSTOSCOPY WITH BLADDER BIOPSY/ RIGHT STENT REMOVAL;  Surgeon: Ceasar Mons, MD;  Location: [facility];  Service: Urology;  Laterality: N/A;  . DILATION AND CURETTAGE OF UTERUS  yrs ago  . LEFT HEART CATH N/A 02/19/2014   Procedure: LEFT HEART CATH;  Surgeon: Blane Ohara, MD;  Location: [facility] C[facility];  Service: Cardiovascular;  Laterality: N/A;  . TRANSURETHRAL RESECTION OF BLADDER TUMOR N/A 03/18/2019   Procedure: TRANSURETHRAL RESECTION OF BLADDER TUMOR (TURBT) WITH CYSTOSCOPY RIGHT STENT PLACEMENT/ INTRAVESICAL INSTILLATION OF GEMCITABINE;  Surgeon: Ceasar Mons, MD;  Location: [facility];  Service: Urology;  Laterality: N/A;  . TRANSURETHRAL RESECTION OF BLADDER TUMOR N/A 09/23/2019   Procedure: Cystoscopy with TURBT (small) and resection of right ureteral orifice, Right JJ stent placement; Right retrograde pyelogram with intraoperative interpretation of fluoroscopic imaging post operative instillation of gemcitabine;  Surgeon: Ceasar Mons, MD;  Location: [facility];  Service: Urology;  Laterality: N/A;  . TUBAL LIGATION  Bilateral yrs ago  . VIDEO ASSISTED THORACOSCOPY (VATS)/EMPYEMA Left 08-30-2009   dr [email or web address]  _0    Social History   Socioeconomic History  . Marital status: Divorced    Spouse name: Not on file  . Number of children: Not on file  . Years of education: Not on file  . Highest education level: Not on file  Occupational History  . Not on file  Tobacco Use  . Smoking status: Former Smoker    Years: 20.00    Types: Cigarettes    Quit date: 08/28/2009    Years since quitting: 10.1  . Smokeless tobacco: Never Used  Vaping Use  . Vaping Use: Never used  Substance and Sexual Activity  . Alcohol use: Yes    Alcohol/week: 0.0 standard drinks    Comment: occasional  . Drug use: Never  . Sexual activity: Not on file  Other Topics Concern  . Not on file  Social History Narrative   ** Merged History Encounter **       Social Determinants of Health   Financial Resource Strain:   . Difficulty of Paying Living Expenses: Not on file  Food Insecurity:   . Worried About Charity fundraiser in the Last Year: Not on file  . Ran Out of Food in the Last Year: Not on file  Transportation Needs:   . Lack of Transportation (  Medical): Not on file  . Lack of Transportation (Non-Medical): Not on file  Physical Activity:   . Days of Exercise per Week: Not on file  . Minutes of Exercise per Session: Not on file  Stress:   . Feeling of Stress : Not on file  Social Connections:   . Frequency of Communication with Friends and Family: Not on file  . Frequency of Social Gatherings with Friends and Family: Not on file  . Attends Religious Services: Not on file  . Active Member of Clubs or Organizations: Not on file  . Attends Archivist Meetings: Not on file  . Marital Status: Not on file  Intimate Partner Violence:   . Fear of Current or Ex-Partner: Not on file  . Emotionally Abused: Not on file  . Physically Abused: Not on file  . Sexually Abused: Not on file   Family History   Problem Relation Age of Onset  . Cancer Mother   . Diabetes Mother   . Heart disease Mother   . Hyperlipidemia Mother   . Hypertension Mother   . Heart disease Maternal Grandmother   . Heart failure Maternal Grandmother   . Heart disease Maternal Grandfather   . Diabetes Maternal Grandfather   . Heart attack Maternal Grandfather   . Cirrhosis Paternal Grandmother   . Diabetes Brother   . Heart disease Brother   . Hyperlipidemia Brother   . Hypertension Brother   . Diabetes Brother   . Hyperlipidemia Brother   . Hypertension Brother   . Stroke Neg Hx   . Colon cancer Neg Hx   . Pancreatic cancer Neg Hx   . Stomach cancer Neg Hx   . Esophageal cancer Neg Hx   . Inflammatory bowel disease Neg Hx   . Liver disease Neg Hx   . Rectal cancer Neg Hx    I have reviewed her medical, social, and family history in detail and updated the electronic medical record as necessary.    PHYSICAL EXAMINATION  BP 114/64   Pulse 92   Ht 5' 3.5" (1.613 m)   Wt 242 lb (109.8 kg)   SpO2 99%   BMI 42.20 kg/m  Wt Readings from Last 3 Encounters:  10/27/19 242 lb (109.8 kg)  09/23/19 239 lb 9.6 oz (108.7 kg)  09/22/19 238 lb (108 kg)  GEN: NAD, appears stated age, doesn't appear chronically ill PSYCH: Cooperative, without pressured speech EYE: Conjunctivae pink, sclerae anicteric ENT: MMM, without oral ulcers, no erythema or exudates noted CV: Nontachycardic RESP: No audible wheezing GI: NABS, soft, protuberant, rounded, NT/ND, without rebound or guarding, no HSM appreciated MSK/EXT: Trace bilateral lower extremity edema SKIN: No jaundice, no spider angiomata NEURO:  Alert & Oriented x 3, no focal deficits, no evidence of asterixis   REVIEW OF DATA  I reviewed the following data at the time of this encounter:  GI Procedures and Studies  No relevant studies to review  Laboratory Studies  Reviewed those in epic  Imaging Studies  August 2021 right upper quadrant  ultrasound IMPRESSION: 1. Moderate dependent echogenic material over the gallbladder likely a combination of tumefactive sludge and stones. 2. Dilated common bile duct measuring 1.5 cm. No definite intraductal stone or mass. Suggestion of mild central intrahepatic ductal dilatation. Consider MRCP for further evaluation. 3. Hepatic steatosis without focal mass.  Reported CT scan earlier this year by [facility] Need to get results/report   ASSESSMENT  Ms. Margolis is a 64 y.o. female with a  pmh significant for ischemic cardiomyopathy with prior PCI intervention, hypertension, hyperlipidemia, prediabetes, bladder cancer (currently undergoing BCG treatments).  The patient is seen today for evaluation and management of:  1. Abnormal LFTs   2. History of bilious vomiting with nausea   3. Dilated bile duct   4. Abnormal ultrasound of liver    The patient is clinically and hemodynamically stable at this time.  Based on the patient's clinical presentation I suspect she actually had issues with as a result of biliary sludge/cholelithiasis leading to potentially a period of choledocholithiasis.  Interestingly, her LFT pattern has only down trended slightly over the course of the last few months.  She has been asymptomatic of it otherwise.  I think additional imaging including an MRI/MRCP will be helpful.  We will see how her labs look most recently.  Although she is not currently having symptoms of nausea or vomiting we will check an H. pylori stool antigen and treat if positive.  If MRI/MRCP shows abnormalities then may need to consider an ERCP.  If there is nothing that is found on MRI/MRCP in regards to etiology for common bile duct dilation then an endoscopic ultrasound will be recommended to rule out ampullary lesion/masses.  We will get the patient up to date from a colon cancer screening perspective and we will move forward with getting her scheduled for colonoscopy.  The risks and benefits  of endoscopic evaluation were discussed with the patient; these include but are not limited to the risk of perforation, infection, bleeding, missed lesions, lack of diagnosis, severe illness requiring hospitalization, as well as anesthesia and sedation related illnesses.  The patient is agreeable to proceed.  The risks of an EUS including intestinal perforation, bleeding, infection, aspiration, and medication effects were discussed as was the possibility it may not give a definitive diagnosis if a biopsy is performed.  When a biopsy of the pancreas is done as part of the EUS, there is an additional risk of pancreatitis at the rate of about 1-2%.  It was explained that procedure related pancreatitis is typically mild, although it can be severe and even life threatening, which is why we do not perform random pancreatic biopsies and only biopsy a lesion/area we feel is concerning enough to warrant the risk. It is not clear if this will be necessary but we did discuss this briefly.  All patient questions were answered to the best of my ability, and the patient agrees to the aforementioned plan of action with follow-up as indicated.   PLAN  Proceed with laboratories as outlined below H. pylori stool antigen to be obtained Proceed with scheduling MRI/MRCP to further evaluate dilated bile duct and abnormal LFTs Proceed with scheduling colorectal cancer screening via colonoscopy   Orders Placed This Encounter  Procedures  . Helicobacter pylori special antigen  . MR ABDOMEN MRCP W WO CONTAST  . Comp Met (CMET)  . INR/PT  . Ambulatory referral to Gastroenterology    New Prescriptions   NA SULFATE-K SULFATE-MG SULF (SUPREP BOWEL PREP KIT) 17.5-3.13-1.6 GM/177ML SOLN    Take 1 kit by mouth as directed. For colonoscopy prep   Modified Medications   No medications on file    Planned Follow Up No follow-ups on file.   Total Time in Face-to-Face and in Coordination of Care for patient including  independent/personal interpretation/review of prior testing, medical history, examination, medication adjustment, communicating results with the patient directly, and documentation with the EHR is 50 minutes.   Justice Britain, MD  [facility] Office # [phone]

## 2019-10-27 NOTE — Patient Instructions (Signed)
Your provider has requested that you go to the basement level for lab work before leaving today. Press "B" on the elevator. The lab is located at the first door on the left as you exit the elevator.  You have been scheduled for an MRI at [facility]  on 11/03/19. Your appointment time is 4:00pm. Please arrive 30 minutes prior to your appointment time for registration purposes. Please make certain not to have anything to eat or drink 4 hours prior to your test. In addition, if you have any metal in your body, have a pacemaker or defibrillator, please be sure to let your ordering physician know. This test typically takes 45 minutes to 1 hour to complete. Should you need to reschedule, please call [phone] to do so.  You have been scheduled for a colonoscopy. Please follow written instructions given to you at your visit today.  Please pick up your prep supplies at the pharmacy within the next 1-3 days. If you use inhalers (even only as needed), please bring them with you on the day of your procedure.  We have sent the following medications to your pharmacy for you to pick up at your convenience: Suprep   Due to recent changes in healthcare laws, you may see the results of your imaging and laboratory studies on MyChart before your provider has had a chance to review them.  We understand that in some cases there may be results that are confusing or concerning to you. Not all laboratory results come back in the same time frame and the provider may be waiting for multiple results in order to interpret others.  Please give [place] 48 hours in order for your provider to thoroughly review all the results before contacting the office for clarification of your results.   If you are age 63 or older, your body mass index should be between 23-30. Your Body mass index is 42.2 kg/m. If this is out of the aforementioned range listed, please consider follow up with your Primary Care Provider.  If you are age 80 or  younger, your body mass index should be between 19-25. Your Body mass index is 42.2 kg/m. If this is out of the aformentioned range listed, please consider follow up with your Primary Care Provider.    Thank you for choosing me and [facility].  Dr. Rush Landmark

## 2019-10-28 LAB — HELICOBACTER PYLORI  SPECIAL ANTIGEN
MICRO NUMBER:: 11061523
SPECIMEN QUALITY: ADEQUATE

## 2019-10-29 ENCOUNTER — Encounter: Payer: Self-pay | Admitting: Gastroenterology

## 2019-10-29 DIAGNOSIS — K838 Other specified diseases of biliary tract: Secondary | ICD-10-CM | POA: Insufficient documentation

## 2019-10-29 DIAGNOSIS — R1114 Bilious vomiting: Secondary | ICD-10-CM | POA: Insufficient documentation

## 2019-10-29 DIAGNOSIS — R932 Abnormal findings on diagnostic imaging of liver and biliary tract: Secondary | ICD-10-CM | POA: Insufficient documentation

## 2019-10-29 DIAGNOSIS — R7989 Other specified abnormal findings of blood chemistry: Secondary | ICD-10-CM | POA: Insufficient documentation

## 2019-10-29 DIAGNOSIS — R945 Abnormal results of liver function studies: Secondary | ICD-10-CM | POA: Insufficient documentation

## 2019-11-03 ENCOUNTER — Ambulatory Visit (HOSPITAL_COMMUNITY)
Admission: RE | Admit: 2019-11-03 | Discharge: 2019-11-03 | Disposition: A | Discharge status: Home / Self Care | Payer: 59 | Source: Ambulatory Visit | Attending: Gastroenterology | Admitting: Gastroenterology

## 2019-11-03 ENCOUNTER — Other Ambulatory Visit: Payer: Self-pay

## 2019-11-03 ENCOUNTER — Other Ambulatory Visit: Payer: Self-pay | Admitting: Gastroenterology

## 2019-11-03 DIAGNOSIS — R7989 Other specified abnormal findings of blood chemistry: Secondary | ICD-10-CM

## 2019-11-03 DIAGNOSIS — K838 Other specified diseases of biliary tract: Secondary | ICD-10-CM | POA: Insufficient documentation

## 2019-11-03 DIAGNOSIS — R945 Abnormal results of liver function studies: Secondary | ICD-10-CM | POA: Diagnosis present

## 2019-11-03 IMAGING — MR MR ABDOMEN WO/W CM MRCP
16 of 20 series · 38 of 48 positions shown · IV contrast (gadavist)
Comparison: Right upper quadrant ultrasound on [DATE]

CLINICAL DATA: Elevated liver function tests. Nausea and vomiting.
Biliary ductal dilatation on recent ultrasound.

EXAM:
MRI ABDOMEN WITHOUT AND WITH CONTRAST (INCLUDING MRCP)
TECHNIQUE: Multiplanar multisequence MR imaging of the abdomen was performed
both before and after the administration of intravenous contrast.
Heavily T2-weighted images of the biliary and pancreatic ducts were
obtained, and three-dimensional MRCP images were rendered by post
processing.
CONTRAST:  10mL GADAVIST GADOBUTROL 1 MMOL/ML IV SOLN

[Series 3: T2 fat-sat · axial · 6.0mm · 1.14mm/px · 1 of 36 slices shown]
[im 1/36]
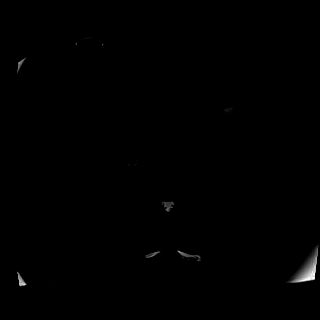

[Series 5: DWI · axial · 6.0mm · 1.37mm/px · z∈[-92,+167]mm · 3 of 74 slices shown (1 of 2)]
[im 1/74]
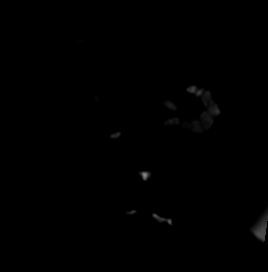
[im 37/74]
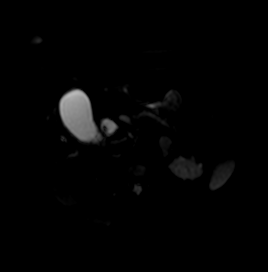
[im 74/74]
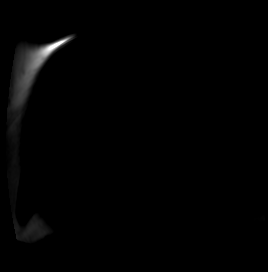

[Series 6: DWI · axial · 6.0mm · 1.37mm/px · 1 of 37 slices shown (2 of 2)]
[im 1/37]
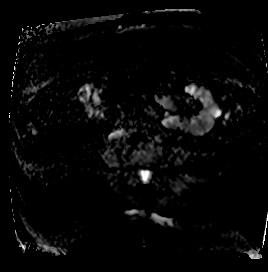

[Series 8: cor_3d_spc_trig · coronal · 1.0mm · 0.49mm/px · 3 of 80 slices shown]
[im 1/80]
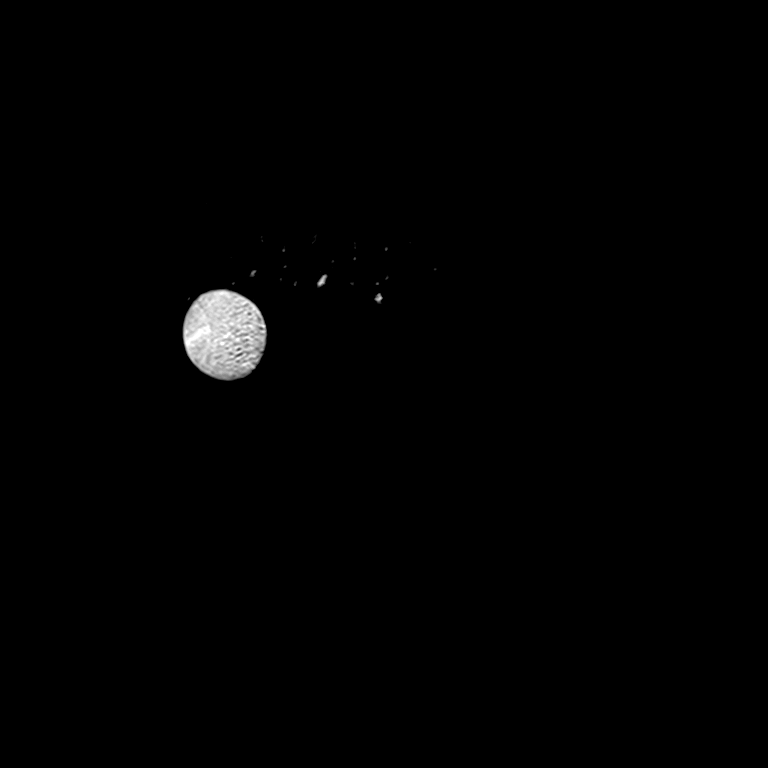
[im 40/80]
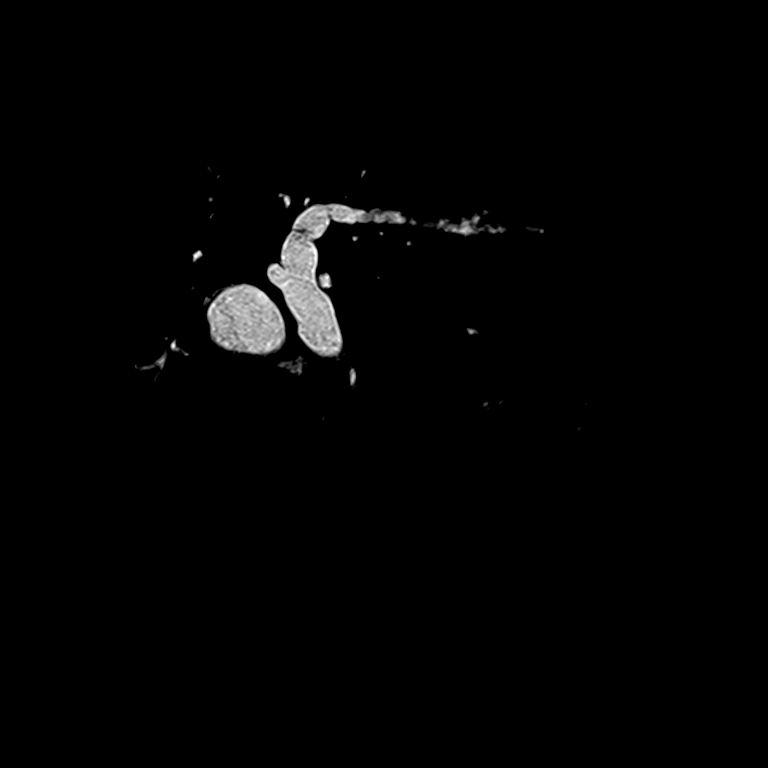
[im 80/80]
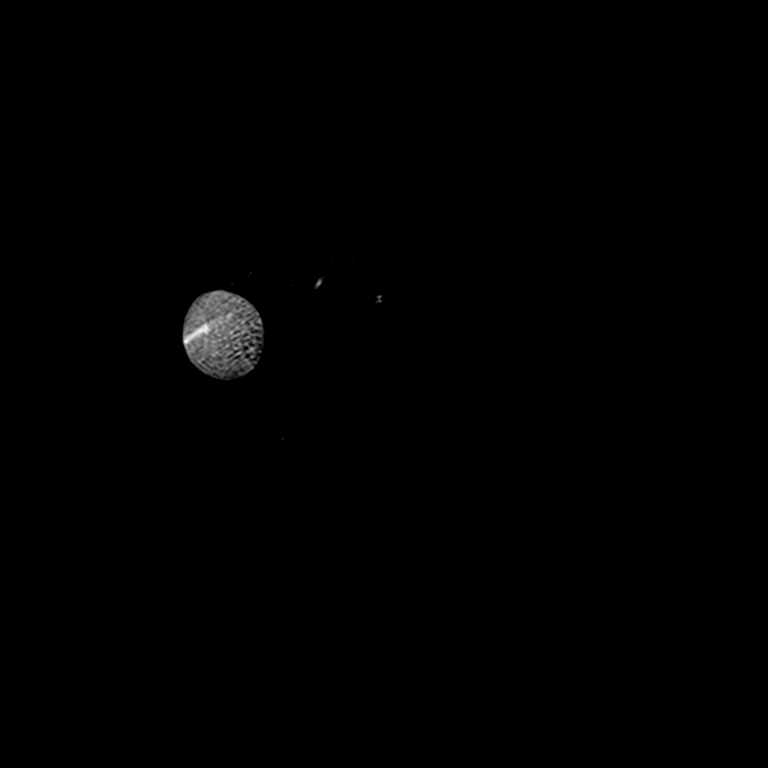

[Series 10: T2 · axial · 6.0mm · 1.43mm/px · 1 of 34 slices shown (1 of 2)]
[im 1/34]
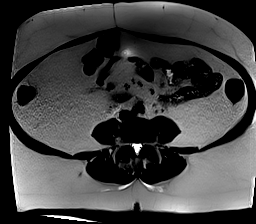

[Series 11: T1 · axial · 3.0mm · 1.14mm/px · z∈[-123,+138]mm · 3 of 88 slices shown (1 of 2)]
[im 1/88]
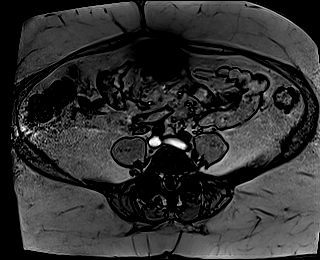
[im 44/88]
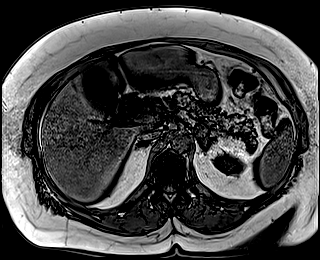
[im 88/88]
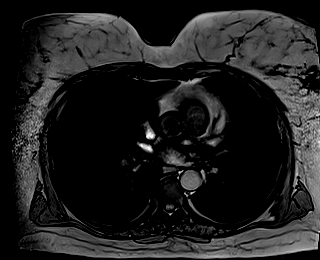

[Series 12: T1 · axial · 3.0mm · 1.14mm/px · z∈[-123,+138]mm · 3 of 88 slices shown (2 of 2)]
[im 1/88]
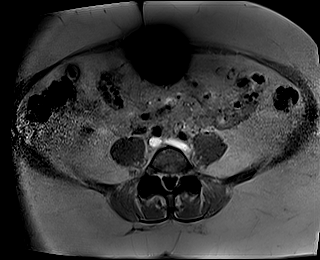
[im 44/88]
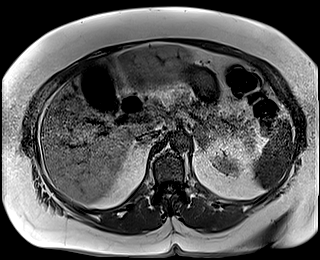
[im 88/88]
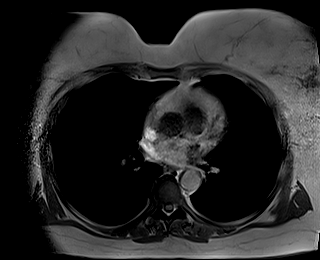

[Series 14: T2 · coronal · 6.0mm · 1.43mm/px · 1 of 30 slices shown (2 of 2)]
[im 1/30]
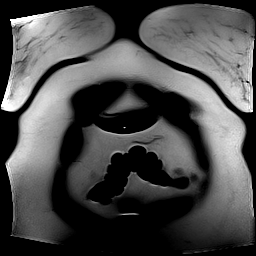

[Series 15: cor obl thk · sagittal · 50.0mm · 0.78mm/px · 1 of 8 slices shown]
[im 1/8]
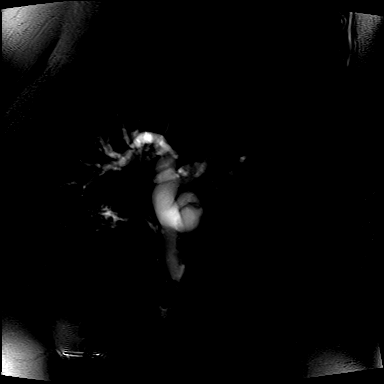

[Series 17: T1 dynamic · axial · 3.0mm · 1.14mm/px · z∈[-120,+141]mm · 3 of 88 slices shown (1 of 7)]
[im 1/88]
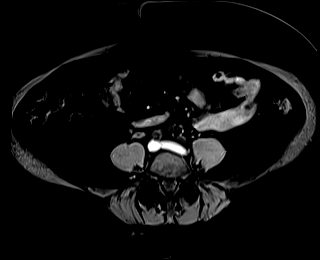
[im 44/88]
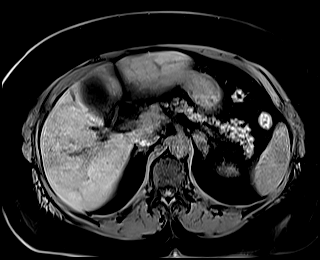
[im 88/88]
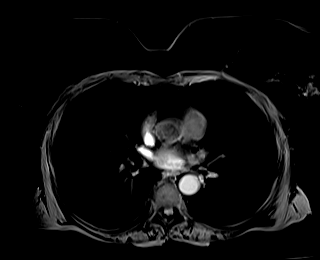

[Series 21: T1 dynamic · axial · 3.0mm · 1.14mm/px · z∈[-120,+141]mm · 3 of 88 slices shown (2 of 7)]
[im 1/88]
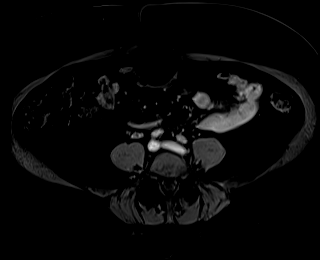
[im 44/88]
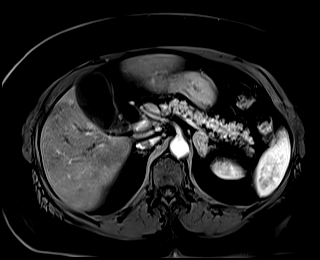
[im 88/88]
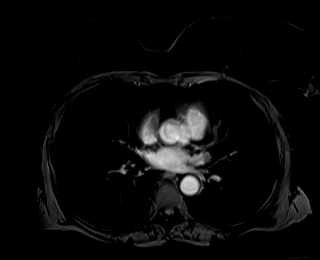

[Series 22: T1 dynamic · axial · 3.0mm · 1.14mm/px · z∈[-120,+141]mm · 3 of 88 slices shown (3 of 7)]
[im 1/88]
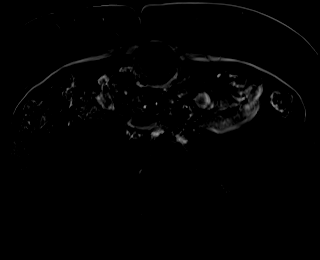
[im 44/88]
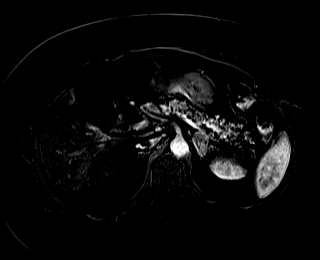
[im 88/88]
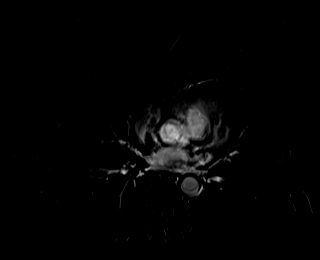

[Series 25: T1 dynamic · axial · 3.0mm · 1.14mm/px · z∈[-120,+141]mm · 3 of 88 slices shown (4 of 7)]
[im 1/88]
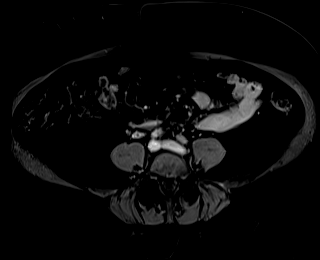
[im 44/88]
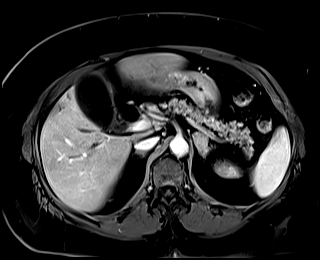
[im 88/88]
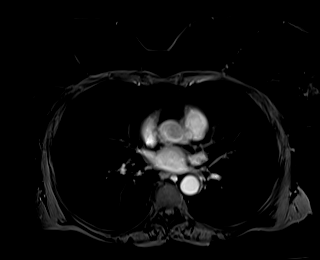

[Series 26: T1 dynamic · axial · 3.0mm · 1.14mm/px · z∈[-120,+141]mm · 3 of 88 slices shown (5 of 7)]
[im 1/88]
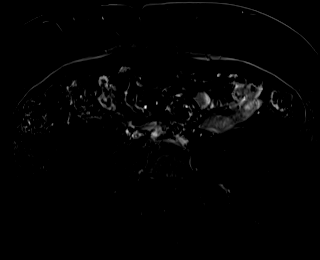
[im 44/88]
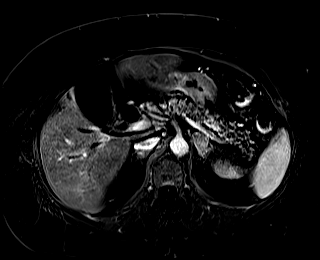
[im 88/88]
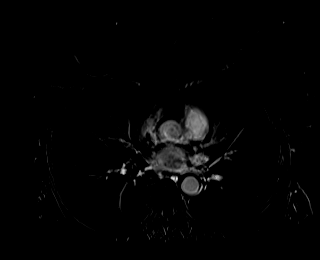

[Series 29: T1 dynamic · axial · 3.0mm · 1.14mm/px · z∈[-120,+141]mm · 3 of 88 slices shown (6 of 7)]
[im 1/88]
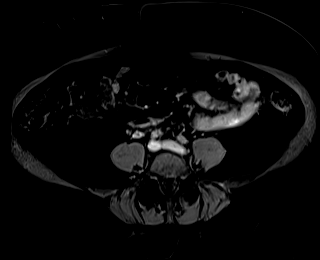
[im 44/88]
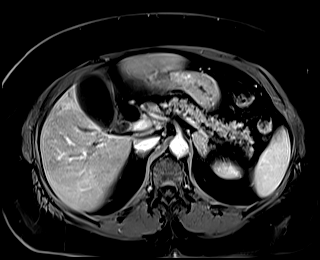
[im 88/88]
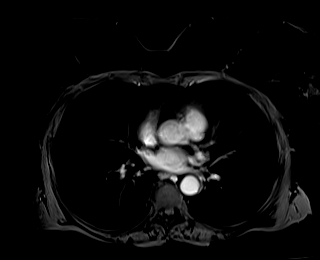

[Series 30: T1 dynamic · axial · 3.0mm · 1.14mm/px · z∈[-120,+141]mm · 3 of 88 slices shown (7 of 7)]
[im 1/88]
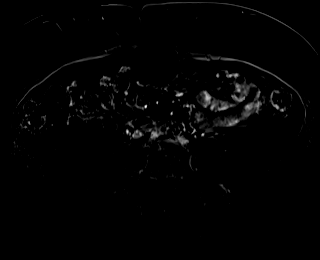
[im 44/88]
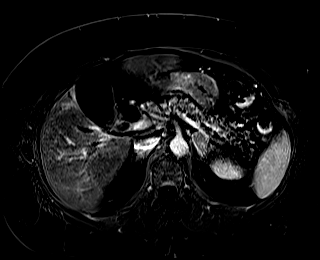
[im 88/88]
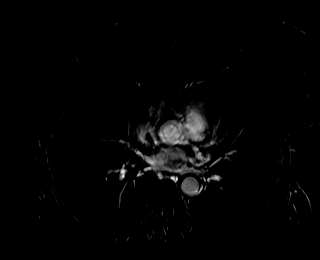

[38 of 48 positions shown; findings below may reference images not displayed]

FINDINGS: Lower chest: No acute findings.

Hepatobiliary: No hepatic masses identified. Gallbladder is
distended, however there is no evidence of cholecystitis. Diffuse
intra and extrahepatic biliary ductal dilatation is seen, with
proximal common bile duct measuring 18 mm in diameter. Numerous tiny
calculi are seen within the common bile duct measuring 2-3 mm.

Pancreas:  No mass or inflammatory changes.

Spleen:  Within normal limits in size and appearance.

Adrenals/Urinary Tract: A 2.6 cm left adrenal mass shows homogeneous
T2 hypointensity and signal dropout chemical shift imaging,
consistent with a benign adenoma. The right adrenal gland is normal
in appearance. No renal masses identified. No evidence of
hydronephrosis.

Stomach/Bowel: Visualized portion unremarkable.

Vascular/Lymphatic: No pathologically enlarged lymph nodes
identified. No abdominal aortic aneurysm.

Other:  None.

Musculoskeletal:  No suspicious bone lesions identified.
IMPRESSION: Diffuse biliary ductal dilatation due to numerous tiny calculi
within the distal common bile duct.

2.6 cm benign left adrenal adenoma.

## 2019-11-03 MED ORDER — GADOBUTROL 1 MMOL/ML IV SOLN
10.0000 mL | Freq: Once | INTRAVENOUS | Status: AC | PRN
  Administered 2019-11-03: 10 mL via INTRAVENOUS

## 2019-11-04 ENCOUNTER — Encounter: Payer: Self-pay | Admitting: Internal Medicine

## 2019-11-04 ENCOUNTER — Ambulatory Visit (INDEPENDENT_AMBULATORY_CARE_PROVIDER_SITE_OTHER): Payer: 59 | Admitting: Internal Medicine

## 2019-11-04 VITALS — BP 104/69 | HR 84 | Temp 97.2°F | Resp 17 | Wt 237.0 lb

## 2019-11-04 DIAGNOSIS — G4700 Insomnia, unspecified: Secondary | ICD-10-CM

## 2019-11-04 DIAGNOSIS — I10 Essential (primary) hypertension: Secondary | ICD-10-CM | POA: Diagnosis not present

## 2019-11-04 MED ORDER — TRAZODONE HCL 50 MG PO TABS: 25.0000 mg | ORAL_TABLET | Freq: Every evening | ORAL | 3 refills | Status: DC | PRN

## 2019-11-04 NOTE — Progress Notes (Signed)
Subjective:    Hannah Beard - 64 y.o. female MRN [id number]  Date of birth: 1956-01-01  HPI  Hannah Beard is here for HTN f/u. Additionally, had MRCP that showed worsening of common bile duct dilation with multiple stones. Will have ERCP performed next.    Chronic HTN Disease Monitoring:  Home BP Monitoring - Daily. 100-120/70-80s avg. Log reviewed.  Chest pain- no  Dyspnea- no Headache - no  Medications: Amlodipine 10 mg  Compliance- yes Lightheadedness- no  Edema- no   Wants handout regarding DASH diet so can start to work on this. Walks or works outside most days. If not outside, rides stationary bike for 20-30 minutes.       Health Maintenance:  There are no preventive care reminders to display for this patient.  -  reports that she quit smoking about 10 years ago. Her smoking use included cigarettes. She quit after 20.00 years of use. She has never used smokeless tobacco. - Review of Systems: Per HPI. - Past Medical History: Patient Active Problem List   Diagnosis Date Noted  . Abnormal LFTs 10/29/2019  . History of bilious vomiting with nausea 10/29/2019  . Dilated bile duct 10/29/2019  . Abnormal ultrasound of liver 10/29/2019  . Chronic diastolic CHF (congestive heart failure) ([place]) 03/07/2014  . ST elevation myocardial infarction (STEMI) of inferior wall ([place]) 02/22/2014  . Former smoker   . CKD (chronic kidney disease)   . HLD (hyperlipidemia)   . CAD (coronary artery disease)   . Bradycardia 02/21/2014  . Acute kidney injury ([place]) 02/21/2014  . Acute diastolic CHF (congestive heart failure) ([place]) 02/21/2014  . CAD (coronary artery disease), native coronary artery 02/21/2014   - Medications: reviewed and updated   Objective:   Physical Exam BP 104/69   Pulse 84   Temp (!) 97.2 F (36.2 C) (Temporal)   Resp 17   Wt 237 lb (107.5 kg)   SpO2 96%   BMI 41.32 kg/m  Physical Exam Constitutional:      General: She is not in  acute distress.    Appearance: She is not diaphoretic.  HENT:     Head: Normocephalic and atraumatic.  Eyes:     Conjunctiva/sclera: Conjunctivae normal.  Cardiovascular:     Rate and Rhythm: Normal rate and regular rhythm.     Heart sounds: Normal heart sounds. No murmur heard.   Pulmonary:     Effort: Pulmonary effort is normal. No respiratory distress.     Breath sounds: Normal breath sounds.  Musculoskeletal:        General: Normal range of motion.  Skin:    General: Skin is warm and dry.  Neurological:     Mental Status: She is alert and oriented to person, place, and time.  Psychiatric:        Mood and Affect: Affect normal.        Judgment: Judgment normal.            Assessment & Plan:   1. Essential hypertension BP initially elevated to 151/89, significantly improved on repeat and well within goal consistent with home measurements. Continue Amlodipine.   2. Insomnia, unspecified type Having trouble turning off thoughts at nighttime due to multiple health concerns currently. Will trial Trazodone. Discussed appropriate use. Sleep hygiene reviewed. Offered appointment with Christa See, LCSW ---will consider. If not improved with sleep aid, could consider daily anti-anxiety medication.  - traZODone (DESYREL) 50 MG tablet; Take 0.5-1 tablets (25-50 mg total) by mouth at bedtime  as needed for sleep.  Dispense: 30 tablet; Refill: [place], D.O. 11/04/2019, 3:59 PM Primary Care at [facility]

## 2019-11-04 NOTE — Patient Instructions (Signed)
DASH Eating Plan DASH stands for "Dietary Approaches to Stop Hypertension." The DASH eating plan is a healthy eating plan that has been shown to reduce high blood pressure (hypertension). It may also reduce your risk for type 2 diabetes, heart disease, and stroke. The DASH eating plan may also help with weight loss. What are tips for following this plan?  General guidelines  Avoid eating more than 2,300 mg (milligrams) of salt (sodium) a day. If you have hypertension, you may need to reduce your sodium intake to 1,500 mg a day.  Limit alcohol intake to no more than 1 drink a day for nonpregnant women and 2 drinks a day for men. One drink equals 12 oz of beer, 5 oz of wine, or 1 oz of hard liquor.  Work with your health care provider to maintain a healthy body weight or to lose weight. Ask what an ideal weight is for you.  Get at least 30 minutes of exercise that causes your heart to beat faster (aerobic exercise) most days of the week. Activities may include walking, swimming, or biking.  Work with your health care provider or diet and nutrition specialist (dietitian) to adjust your eating plan to your individual calorie needs. Reading food labels   Check food labels for the amount of sodium per serving. Choose foods with less than 5 percent of the Daily Value of sodium. Generally, foods with less than 300 mg of sodium per serving fit into this eating plan.  To find whole grains, look for the word "whole" as the first word in the ingredient list. Shopping  Buy products labeled as "low-sodium" or "no salt added."  Buy fresh foods. Avoid canned foods and premade or frozen meals. Cooking  Avoid adding salt when cooking. Use salt-free seasonings or herbs instead of table salt or sea salt. Check with your health care provider or pharmacist before using salt substitutes.  Do not fry foods. Cook foods using healthy methods such as baking, boiling, grilling, and broiling instead.  Cook with  heart-healthy oils, such as olive, canola, soybean, or sunflower oil. Meal planning  Eat a balanced diet that includes: ? 5 or more servings of fruits and vegetables each day. At each meal, try to fill half of your plate with fruits and vegetables. ? Up to 6-8 servings of whole grains each day. ? Less than 6 oz of lean meat, poultry, or fish each day. A 3-oz serving of meat is about the same size as a deck of cards. One egg equals 1 oz. ? 2 servings of low-fat dairy each day. ? A serving of nuts, seeds, or beans 5 times each week. ? Heart-healthy fats. Healthy fats called Omega-3 fatty acids are found in foods such as flaxseeds and coldwater fish, like sardines, salmon, and mackerel.  Limit how much you eat of the following: ? Canned or prepackaged foods. ? Food that is high in trans fat, such as fried foods. ? Food that is high in saturated fat, such as fatty meat. ? Sweets, desserts, sugary drinks, and other foods with added sugar. ? Full-fat dairy products.  Do not salt foods before eating.  Try to eat at least 2 vegetarian meals each week.  Eat more home-cooked food and less restaurant, buffet, and fast food.  When eating at a restaurant, ask that your food be prepared with less salt or no salt, if possible. What foods are recommended? The items listed may not be a complete list. Talk with your dietitian about   what dietary choices are best for you. Grains Whole-grain or whole-wheat bread. Whole-grain or whole-wheat pasta. Brown rice. Oatmeal. Quinoa. Bulgur. Whole-grain and low-sodium cereals. Pita bread. Low-fat, low-sodium crackers. Whole-wheat flour tortillas. Vegetables Fresh or frozen vegetables (raw, steamed, roasted, or grilled). Low-sodium or reduced-sodium tomato and vegetable juice. Low-sodium or reduced-sodium tomato sauce and tomato paste. Low-sodium or reduced-sodium canned vegetables. Fruits All fresh, dried, or frozen fruit. Canned fruit in natural juice (without  added sugar). Meat and other protein foods Skinless chicken or turkey. Ground chicken or turkey. Pork with fat trimmed off. Fish and seafood. Egg whites. Dried beans, peas, or lentils. Unsalted nuts, nut butters, and seeds. Unsalted canned beans. Lean cuts of beef with fat trimmed off. Low-sodium, lean deli meat. Dairy Low-fat (1%) or fat-free (skim) milk. Fat-free, low-fat, or reduced-fat cheeses. Nonfat, low-sodium ricotta or cottage cheese. Low-fat or nonfat yogurt. Low-fat, low-sodium cheese. Fats and oils Soft margarine without trans fats. Vegetable oil. Low-fat, reduced-fat, or light mayonnaise and salad dressings (reduced-sodium). Canola, safflower, olive, soybean, and sunflower oils. Avocado. Seasoning and other foods Herbs. Spices. Seasoning mixes without salt. Unsalted popcorn and pretzels. Fat-free sweets. What foods are not recommended? The items listed may not be a complete list. Talk with your dietitian about what dietary choices are best for you. Grains Baked goods made with fat, such as croissants, muffins, or some breads. Dry pasta or rice meal packs. Vegetables Creamed or fried vegetables. Vegetables in a cheese sauce. Regular canned vegetables (not low-sodium or reduced-sodium). Regular canned tomato sauce and paste (not low-sodium or reduced-sodium). Regular tomato and vegetable juice (not low-sodium or reduced-sodium). Pickles. Olives. Fruits Canned fruit in a light or heavy syrup. Fried fruit. Fruit in cream or butter sauce. Meat and other protein foods Fatty cuts of meat. Ribs. Fried meat. Bacon. Sausage. Bologna and other processed lunch meats. Salami. Fatback. Hotdogs. Bratwurst. Salted nuts and seeds. Canned beans with added salt. Canned or smoked fish. Whole eggs or egg yolks. Chicken or turkey with skin. Dairy Whole or 2% milk, cream, and half-and-half. Whole or full-fat cream cheese. Whole-fat or sweetened yogurt. Full-fat cheese. Nondairy creamers. Whipped toppings.  Processed cheese and cheese spreads. Fats and oils Butter. Stick margarine. Lard. Shortening. Ghee. Bacon fat. Tropical oils, such as coconut, palm kernel, or palm oil. Seasoning and other foods Salted popcorn and pretzels. Onion salt, garlic salt, seasoned salt, table salt, and sea salt. Worcestershire sauce. Tartar sauce. Barbecue sauce. Teriyaki sauce. Soy sauce, including reduced-sodium. Steak sauce. Canned and packaged gravies. Fish sauce. Oyster sauce. Cocktail sauce. Horseradish that you find on the shelf. Ketchup. Mustard. Meat flavorings and tenderizers. Bouillon cubes. Hot sauce and Tabasco sauce. Premade or packaged marinades. Premade or packaged taco seasonings. Relishes. Regular salad dressings. Where to find more information:  National Heart, Lung, and Blood Institute: www.nhlbi.nih.gov  American Heart Association: www.heart.org Summary  The DASH eating plan is a healthy eating plan that has been shown to reduce high blood pressure (hypertension). It may also reduce your risk for type 2 diabetes, heart disease, and stroke.  With the DASH eating plan, you should limit salt (sodium) intake to 2,300 mg a day. If you have hypertension, you may need to reduce your sodium intake to 1,500 mg a day.  When on the DASH eating plan, aim to eat more fresh fruits and vegetables, whole grains, lean proteins, low-fat dairy, and heart-healthy fats.  Work with your health care provider or diet and nutrition specialist (dietitian) to adjust your eating plan to your   individual calorie needs. This information is not intended to replace advice given to you by your health care provider. Make sure you discuss any questions you have with your health care provider. Document Revised: 12/14/2016 Document Reviewed: 12/26/2015 Elsevier Patient Education  2020 Elsevier Inc.  

## 2019-11-09 ENCOUNTER — Telehealth: Payer: Self-pay | Admitting: Gastroenterology

## 2019-11-09 NOTE — Telephone Encounter (Signed)
Pt is requesting a call back from a nurse to schedule an ERCP procedure.

## 2019-11-09 NOTE — Telephone Encounter (Signed)
Mansouraty, Telford Nab., MD  Timothy Lasso, RN Cc: Nicolette Bang, DO I called and spoke with the patient this afternoon. We reviewed the findings which show multiple small stones within the bile duct. An ERCP with stone extraction is required at this time. Lonia Roane, please move forward with scheduling this patient an ERCP next available with me. She needs on Monday most likely. If it has to be scheduled online Monday in the hospital during my hospital week, please make this happen. Please go over things to be on the look out for should she have symptomatic choledocholithiasis occur though thankfully her liver tests have been downtrending over the last few months. Thanks. GM

## 2019-11-10 ENCOUNTER — Other Ambulatory Visit: Payer: Self-pay

## 2019-11-10 DIAGNOSIS — K838 Other specified diseases of biliary tract: Secondary | ICD-10-CM

## 2019-11-10 DIAGNOSIS — R945 Abnormal results of liver function studies: Secondary | ICD-10-CM

## 2019-11-10 DIAGNOSIS — R7989 Other specified abnormal findings of blood chemistry: Secondary | ICD-10-CM

## 2019-11-10 NOTE — Telephone Encounter (Signed)
I have the pt scheduled for ERCP on 12/31/19 at [facility].  Does she still need the 12/14 colon?

## 2019-11-10 NOTE — Telephone Encounter (Signed)
I changed the appt to 11/29 (hospital week) at 9 am at [facility].  I will leave the colon as planned.  The pt has been advised and instructed. She has been advised of the symptoms to watch for ;  "Pain on the upper right side or upper middle part of the abdomen. It may also be felt in the back or below the right shoulder blade. The pain may come and go and feel sharp, cramp-like, or dull. Fever and chills. Dark urine and clay-colored stools. Nausea and vomiting. Yellowing of the skin (jaundice), which may come and go."   She will go to the ED and call our office if she has any of the previous.

## 2019-11-10 NOTE — Telephone Encounter (Signed)
Thank you for update. We'll take care of the stones then or sooner if other issues arise. GM

## 2019-11-12 ENCOUNTER — Telehealth: Payer: Self-pay

## 2019-11-12 DIAGNOSIS — Z23 Encounter for immunization: Secondary | ICD-10-CM

## 2019-11-12 MED ORDER — ZOSTER VAC RECOMB ADJUVANTED 50 MCG/0.5ML IM SUSR: 0.5000 mL | Freq: Once | INTRAMUSCULAR | 0 refills | Status: AC

## 2019-11-12 NOTE — Telephone Encounter (Signed)
Please send the shingles to [facility] on [address].

## 2019-11-14 ENCOUNTER — Ambulatory Visit: Payer: 59 | Attending: Internal Medicine

## 2019-11-14 DIAGNOSIS — Z23 Encounter for immunization: Secondary | ICD-10-CM

## 2019-11-14 NOTE — Progress Notes (Signed)
   Covid-19 Vaccination Clinic  Name:  Lilu Mcglown    MRN: [id number] DOB: [birth year]  11/14/2019  Ms. Andreas was observed post Covid-19 immunization for 15 minutes without incident. She was provided with Vaccine Information Sheet and instruction to access the V-Safe system.   Ms. Bare was instructed to call 911 with any severe reactions post vaccine: Marland Kitchen Difficulty breathing  . Swelling of face and throat  . A fast heartbeat  . A bad rash all over body  . Dizziness and weakness

## 2019-12-04 ENCOUNTER — Telehealth: Payer: Self-pay

## 2019-12-04 NOTE — Telephone Encounter (Signed)
-----   Message from [name]., MD sent at 12/03/2019  8:56 PM EST ----- Regarding: Get patient in earlier Hannah Beard, Since we had the postponement of the patient who was on at 33, can we work to get this patient earlier in the day? I don't know if the rules for the COVID testing apply for after Thanksgiving (I suspect so), so if you have to put the patient on at 830 that is fine with me. Thanks. GM

## 2019-12-04 NOTE — Telephone Encounter (Signed)
The pt has been advised that her appt is not at 730 and she will arrive at 6 am.

## 2019-12-09 ENCOUNTER — Other Ambulatory Visit: Payer: Self-pay

## 2019-12-09 ENCOUNTER — Encounter (HOSPITAL_COMMUNITY): Payer: Self-pay | Admitting: Gastroenterology

## 2019-12-09 NOTE — Progress Notes (Signed)
PCP - Dr Hollace Hayward  Cardiologist - n/a  Chest x-ray - n/a EKG - 08/01/19 Stress Test - n/a ECHO - 02/20/14 Cardiac Cath - 02/19/14  Anesthesia review: Yes  STOP now taking any Aspirin (unless otherwise instructed by your surgeon), Aleve, Naproxen, Ibuprofen, Motrin, Advil, Goody's, BC's, all herbal medications, fish oil, and all vitamins.   Coronavirus Screening Covid test is scheduled on 12/11/19. Do you have any of the following symptoms:  Cough yes/no: No Fever (>100.41F)  yes/no: No Runny nose yes/no: No Sore throat yes/no: No Difficulty breathing/shortness of breath  yes/no: No  Have you traveled in the last 14 days and where? yes/no: No  Patient verbalized understanding of instructions that were given via phone.

## 2019-12-11 ENCOUNTER — Other Ambulatory Visit (HOSPITAL_COMMUNITY)
Admission: RE | Admit: 2019-12-11 | Discharge: 2019-12-11 | Disposition: A | Discharge status: Home / Self Care | Payer: 59 | Source: Ambulatory Visit | Attending: Gastroenterology | Admitting: Gastroenterology

## 2019-12-11 DIAGNOSIS — Z20822 Contact with and (suspected) exposure to covid-19: Secondary | ICD-10-CM | POA: Insufficient documentation

## 2019-12-11 DIAGNOSIS — Z01812 Encounter for preprocedural laboratory examination: Secondary | ICD-10-CM | POA: Insufficient documentation

## 2019-12-11 LAB — SARS CORONAVIRUS 2 (TAT 6-24 HRS): SARS Coronavirus 2: NEGATIVE

## 2019-12-11 NOTE — Progress Notes (Signed)
Anesthesia Chart Review:  Pt is a same day work up    Case: [id number] Date/Time: 12/14/19 0730   Procedure: ENDOSCOPIC RETROGRADE CHOLANGIOPANCREATOGRAPHY (ERCP) WITH PROPOFOL (N/A )   Anesthesia type: General   Pre-op diagnosis: bile duct stone removal   Location: [facility] ROOM 1 / [facility]   Surgeons: [name]., MD      DISCUSSION:  Pt is a 64 year old with hx pre-diabetes, HTN, CAD (STEMI, DES to LCx 2016)   Pt was last seen by cardiologist, Dr. Sherren Mocha, 04/28/2014. She has not followed up with cardiology since then. [name], RN did not document pt with CV symptoms during pre-op phone call 12/09/19.    The above scheduled procedure will be pt's 4th since March 2021 (s/p TURBT 03/18/19, cystoscopy 04/29/19, and cystoscopy with TURBT 9/89/21).     PROVIDERS: - PCP is Nicolette Bang, DO. Last office visit 11/04/19   LABS:  Will be obtained day of surgery as needed.    EKG 08/01/19 (done in the ER in the setting of vomiting): NSR with sinus arrhythmia. Inferior infarct, age undetermined. Anterior infarct, age undetermined   CV: Echo 02/20/14:  - Left ventricle: The cavity size was normal. Wall thickness wasincreased in a pattern of mild LVH. Systolic function was normal. The estimated ejection fraction was in the range of 55% to 60%. Basal to mid inferior and inferolateral severe hypokinesis. Features are consistent with a pseudonormal left ventricular filling pattern, with concomitant abnormal relaxation and increased filling pressure (grade 2 diastolic dysfunction).  - Aortic valve: There was no stenosis.  - Mitral valve: Mildly calcified annulus. There was no significant regurgitation.  - Right ventricle: The cavity size was normal. Systolic functionwas normal.  - Pulmonary arteries: No complete TR doppler jet so unable to estimate PA systolic pressure.  - Inferior vena cava: The vessel was normal in size. The respirophasic diameter changes  were in the normal range (>= 50%), consistent with normal central venous pressure.  - Pericardium, extracardiac: A trivial pericardial effusion was identified.  - Impressions:  Normal LV size with mild LV hypertrophy. EF 55%. There was basal to mid inferior and inferolateral severe hypokinesis. Moderate diastolic dysfunction. Normal RV size and systolic function.   Cardiac cath 02/19/14:  1. Acute inferoposterior/lateral MI secondary to occlusion of a dominant left circumflex, treated successfully with primary PCI using a drug-eluting stent 2. Minimal nonobstructive disease of the LAD and mild to moderate nonobstructive stenosis (50-60%) of a nondominant RCA 3. Mild segmental contraction abnormality the left ventricle with preserved overall LVEF 4. Markedly elevated left ventricular end-diastolic pressure    Past Medical History:  Diagnosis Date  . (HFpEF) heart failure with preserved ejection fraction ([place]) 02/2014   volume excess in setting of STEMI and volume resuscitation (EF preserved) post cardiac cath;  last echo 02-20-2014  ef 55-60%. G2DD  . Arthritis    back L3-S1  . Bilateral cataracts    MD just watching  . Bladder cancer ([place])   . CAD (coronary artery disease) previous cardiologist Dr Burt Knack, lov note in epic 04-28-2014  (03-13-2019  per pt has not seen any doctor in approx. 5 yrs ago, but denies any cardiac s&s)   a. 02/19/14 inf STEMI s/p DES to LCx and moderate stenosis midRCA  . Eczema   . History of pleural empyema    left side  08-30-2009 s/p  left VATS w/ drainage/ decortication  . History of ST elevation myocardial infarction (STEMI) 02/19/2014  .  HLD (hyperlipidemia)   . Hypertension    03-13-2019  was on medication 5 yrs ago but none since this time due to not having seen any doctor for 5 yrs due to insurance issue's  . Pneumonia    x  1  . Pre-diabetes    diet controlled - no meds  . S/P drug eluting coronary stent placement    02-20-2004   PCI and DES x1 to LCx   . Wears glasses     Past Surgical History:  Procedure Laterality Date  . CARDIAC CATHETERIZATION  02/19/2014   left  . CYSTOSCOPY WITH BIOPSY N/A 04/29/2019   Procedure: CYSTOSCOPY WITH BLADDER BIOPSY/ RIGHT STENT REMOVAL;  Surgeon: Ceasar Mons, MD;  Location: [facility];  Service: Urology;  Laterality: N/A;  . DILATION AND CURETTAGE OF UTERUS  yrs ago  . LEFT HEART CATH N/A 02/19/2014   Procedure: LEFT HEART CATH;  Surgeon: Blane Ohara, MD;  Location: [facility] CATH LAB;  Service: Cardiovascular;  Laterality: N/A;  . TRANSURETHRAL RESECTION OF BLADDER TUMOR N/A 03/18/2019   Procedure: TRANSURETHRAL RESECTION OF BLADDER TUMOR (TURBT) WITH CYSTOSCOPY RIGHT STENT PLACEMENT/ INTRAVESICAL INSTILLATION OF GEMCITABINE;  Surgeon: Ceasar Mons, MD;  Location: [facility];  Service: Urology;  Laterality: N/A;  . TRANSURETHRAL RESECTION OF BLADDER TUMOR N/A 09/23/2019   Procedure: Cystoscopy with TURBT (small) and resection of right ureteral orifice, Right JJ stent placement; Right retrograde pyelogram with intraoperative interpretation of fluoroscopic imaging post operative instillation of gemcitabine;  Surgeon: Ceasar Mons, MD;  Location: [facility];  Service: Urology;  Laterality: N/A;  . TUBAL LIGATION Bilateral yrs ago  . VIDEO ASSISTED THORACOSCOPY (VATS)/EMPYEMA Left 08-30-2009   dr hendrickson  '@[facility]'     MEDICATIONS: No current facility-administered medications for this encounter.   Marland Kitchen acetaminophen (TYLENOL) 650 MG CR tablet  . amLODipine (NORVASC) 10 MG tablet  . ascorbic acid (VITAMIN C) 500 MG tablet  . atorvastatin (LIPITOR) 80 MG tablet  . Cholecalciferol (VITAMIN D3) 25 MCG (1000 UT) CAPS  . traZODone (DESYREL) 50 MG tablet  . triamcinolone cream (KENALOG) 0.1 %  . Na Sulfate-K Sulfate-Mg Sulf (SUPREP BOWEL PREP KIT) 17.5-3.13-1.6 GM/177ML SOLN    If no changes, I anticipate pt can proceed with surgery  as scheduled.   Willeen Cass, PhD, FNP-BC [facility]/Anesthesiology Phone: [phone] 12/11/2019 3:13 PM

## 2019-12-11 NOTE — Anesthesia Preprocedure Evaluation (Addendum)
Anesthesia Evaluation  Patient identified by MRN, date of birth, ID band Patient awake    Reviewed: Allergy & Precautions, NPO status , Patient's Chart, lab work & pertinent test results  Airway Mallampati: II       Dental  (+) Poor Dentition, Chipped, Missing, Dental Advisory Given,    Pulmonary neg pulmonary ROS, former smoker,    Pulmonary exam normal breath sounds clear to auscultation       Cardiovascular hypertension, Pt. on medications + CAD, + Past MI, + Cardiac Stents and +CHF  Normal cardiovascular exam Rhythm:Regular Rate:Normal     Neuro/Psych negative neurological ROS  negative psych ROS   GI/Hepatic negative GI ROS, Neg liver ROS,   Endo/Other  negative endocrine ROS  Renal/GU Renal diseaseBladder cancer   negative genitourinary   Musculoskeletal negative musculoskeletal ROS (+) Arthritis , Osteoarthritis,    Abdominal Normal abdominal exam  (+)   Peds negative pediatric ROS (+)  Hematology negative hematology ROS (+)   Anesthesia Other Findings   Reproductive/Obstetrics negative OB ROS                          Anesthesia Physical Anesthesia Plan  ASA: III  Anesthesia Plan: General   Post-op Pain Management:    Induction: Intravenous  PONV Risk Score and Plan: 3 and Ondansetron, Dexamethasone and Treatment may vary due to age or medical condition  Airway Management Planned: Oral ETT  Additional Equipment:   Intra-op Plan:   Post-operative Plan: Extubation in OR  Informed Consent:   Plan Discussed with: Anesthesiologist and CRNA  Anesthesia Plan Comments: (See APP note by Durel Salts, FNP   Pt is a 64 year old with hx pre-diabetes, HTN, CAD (STEMI, DES to LCx 2016)  Pt was last seen by cardiologist, Dr. Sherren Mocha, 04/28/2014. She has not followed up with cardiology since then. [name], RN did not document pt with CV symptoms during pre-op phone call  12/09/19.  The above scheduled procedure will be pt's 4th since March 2021 (s/p TURBT 03/18/19, cystoscopy 04/29/19, and cystoscopy with TURBT 9/89/21).  EKG 08/01/19 (done in the ER in the setting of vomiting): NSR with sinus arrhythmia. Inferior infarct, age undetermined. Anterior infarct, age undetermined  Echo 02/20/14:  - Left ventricle: The cavity size was normal. Wall thickness wasincreased in a pattern of mild LVH. Systolic function was normal. The estimated ejection fraction was in the range of 55% to 60%. Basal to mid inferior and inferolateral severe hypokinesis. Features are consistent with a pseudonormal left ventricular filling pattern, with concomitant abnormal relaxation and increased filling pressure (grade 2 diastolic dysfunction).  - Aortic valve: There was no stenosis.  - Mitral valve: Mildly calcified annulus. There was no significant regurgitation.  - Right ventricle: The cavity size was normal. Systolic functionwas normal.  - Pulmonary arteries: No complete TR doppler jet so unable to estimate PA systolic pressure.  - Inferior vena cava: The vessel was normal in size. The respirophasic diameter changes were in the normal range (>= 50%), consistent with normal central venous pressure.  - Pericardium, extracardiac: A trivial pericardial effusion was identified.  - Impressions:  Normal LV size with mild LV hypertrophy. EF 55%. There was basal to mid inferior and inferolateral severe hypokinesis. Moderate diastolic dysfunction. Normal RV size and systolic function.  Cardiac cath 02/19/14:  1. Acute inferoposterior/lateral MI secondary to occlusion of a dominant left circumflex, treated successfully with primary PCI using a drug-eluting stent 2. Minimal  nonobstructive disease of the LAD and mild to moderate nonobstructive stenosis (50-60%) of a nondominant RCA 3. Mild segmental contraction abnormality the left ventricle with preserved overall LVEF 4. Markedly elevated left  ventricular end-diastolic pressure )      Anesthesia Quick Evaluation

## 2019-12-14 ENCOUNTER — Encounter (HOSPITAL_COMMUNITY): Payer: Self-pay | Admitting: Gastroenterology

## 2019-12-14 ENCOUNTER — Ambulatory Visit (HOSPITAL_COMMUNITY): Payer: 59 | Admitting: Emergency Medicine

## 2019-12-14 ENCOUNTER — Ambulatory Visit (HOSPITAL_COMMUNITY): Payer: 59

## 2019-12-14 ENCOUNTER — Ambulatory Visit (HOSPITAL_COMMUNITY)
Admission: RE | Admit: 2019-12-14 | Discharge: 2019-12-14 | Disposition: A | Discharge status: Home / Self Care | Payer: 59 | Attending: Gastroenterology | Admitting: Gastroenterology

## 2019-12-14 ENCOUNTER — Other Ambulatory Visit: Payer: Self-pay | Admitting: Physician Assistant

## 2019-12-14 ENCOUNTER — Encounter (HOSPITAL_COMMUNITY)
Admission: RE | Disposition: A | Discharge status: Home / Self Care | Payer: Self-pay | Source: Home / Self Care | Attending: Gastroenterology

## 2019-12-14 DIAGNOSIS — R7989 Other specified abnormal findings of blood chemistry: Secondary | ICD-10-CM

## 2019-12-14 DIAGNOSIS — K838 Other specified diseases of biliary tract: Secondary | ICD-10-CM

## 2019-12-14 DIAGNOSIS — D131 Benign neoplasm of stomach: Secondary | ICD-10-CM | POA: Insufficient documentation

## 2019-12-14 DIAGNOSIS — Z955 Presence of coronary angioplasty implant and graft: Secondary | ICD-10-CM | POA: Diagnosis not present

## 2019-12-14 DIAGNOSIS — K8020 Calculus of gallbladder without cholecystitis without obstruction: Secondary | ICD-10-CM

## 2019-12-14 DIAGNOSIS — K2970 Gastritis, unspecified, without bleeding: Secondary | ICD-10-CM | POA: Insufficient documentation

## 2019-12-14 DIAGNOSIS — K8050 Calculus of bile duct without cholangitis or cholecystitis without obstruction: Secondary | ICD-10-CM | POA: Diagnosis not present

## 2019-12-14 DIAGNOSIS — Z87891 Personal history of nicotine dependence: Secondary | ICD-10-CM | POA: Diagnosis not present

## 2019-12-14 HISTORY — PX: ENDOSCOPIC RETROGRADE CHOLANGIOPANCREATOGRAPHY (ERCP) WITH PROPOFOL: SHX5810

## 2019-12-14 HISTORY — DX: Unspecified osteoarthritis, unspecified site: M19.90

## 2019-12-14 HISTORY — DX: Pneumonia, unspecified organism: J18.9

## 2019-12-14 HISTORY — PX: BILIARY DILATION: SHX6850

## 2019-12-14 HISTORY — PX: BIOPSY: SHX5522

## 2019-12-14 HISTORY — PX: ESOPHAGOGASTRODUODENOSCOPY (EGD) WITH PROPOFOL: SHX5813

## 2019-12-14 HISTORY — PX: SPHINCTEROTOMY: SHX5544

## 2019-12-14 HISTORY — DX: Unspecified cataract: H26.9

## 2019-12-14 HISTORY — PX: REMOVAL OF STONES: SHX5545

## 2019-12-14 IMAGING — RF DG ERCP WO/W SPHINCTEROTOMY
1 series · 15 of 21 positions shown · non-contrast
Comparison: MRCP [DATE]

CLINICAL DATA: Choledocholithiasis.

EXAM:
ERCP
TECHNIQUE: Multiple spot images obtained with the fluoroscopic device and
submitted for interpretation post-procedure.
FLUOROSCOPY TIME:  Fluoroscopy Time:  11 minutes, 24 seconds
Radiation Exposure Index (if provided by the fluoroscopic device):
221.3 mGy

[Series 1: unknown protocol · 0.20mm/px · 15 of 21 slices shown]
[im 1/21]
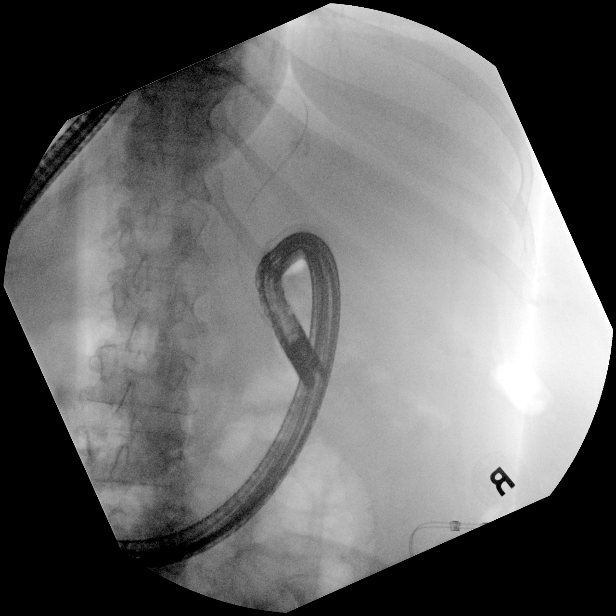
[im 3/21]
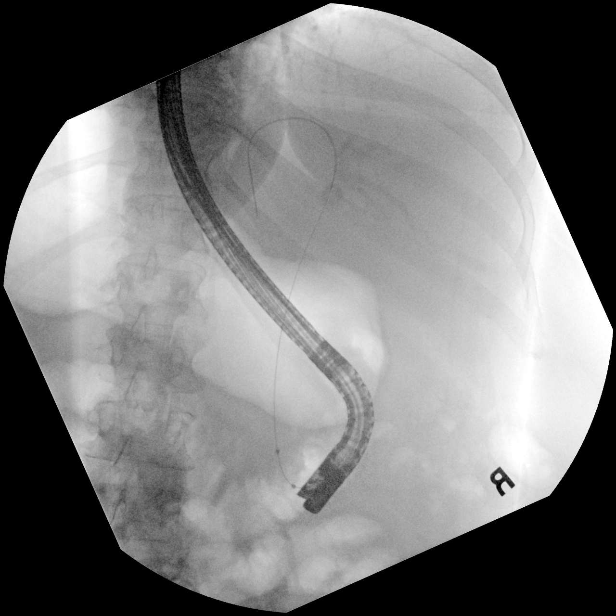
[im 4/21]
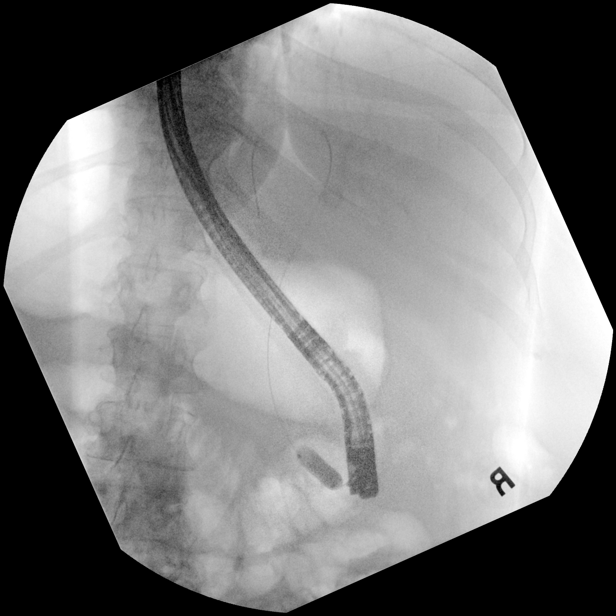
[im 5/21]
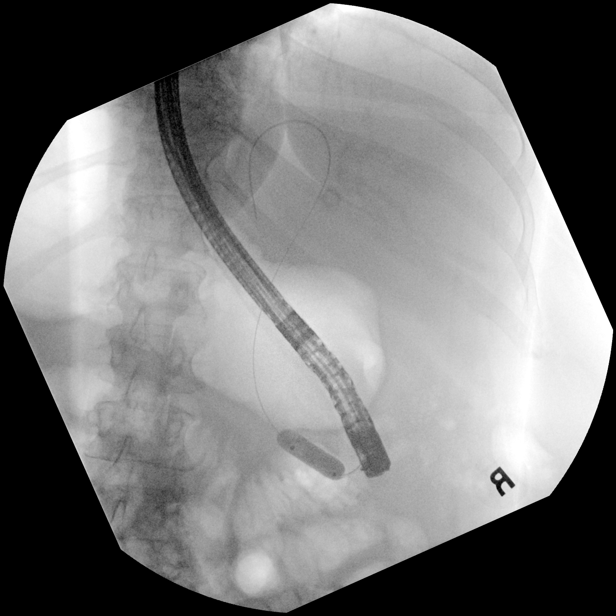
[im 7/21]
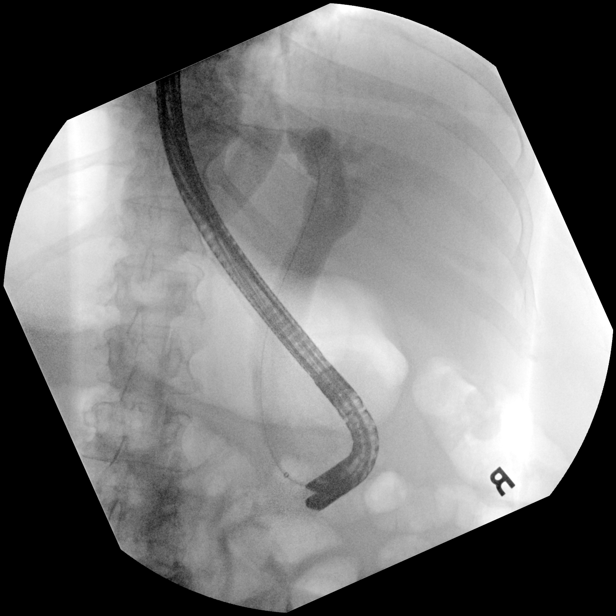
[im 8/21]
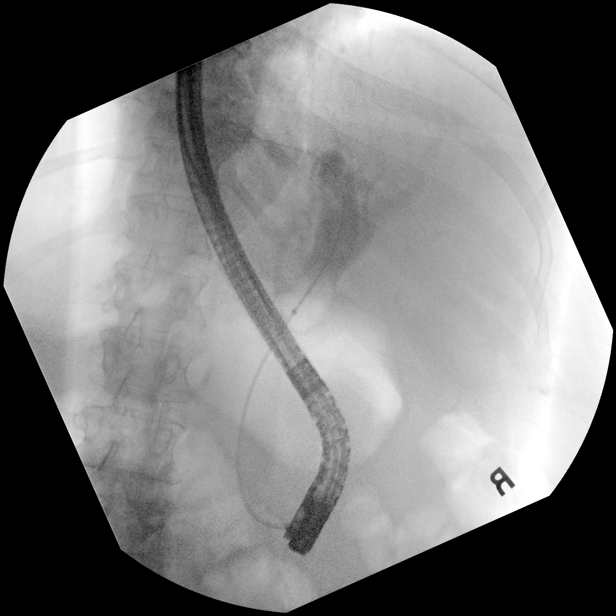
[im 10/21]
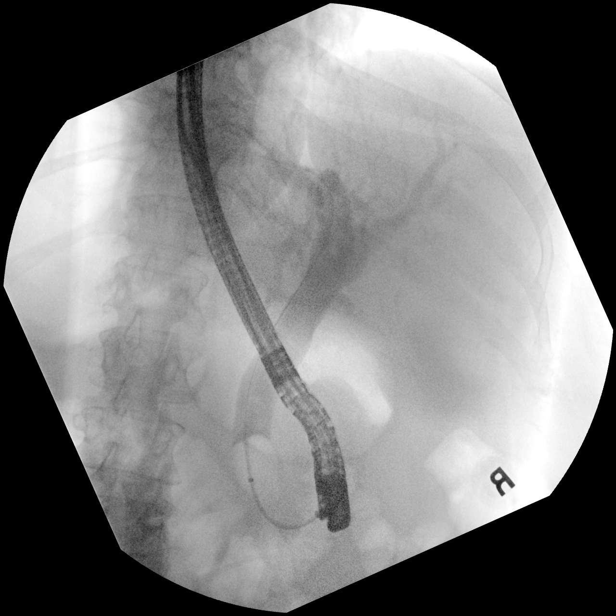
[im 11/21]
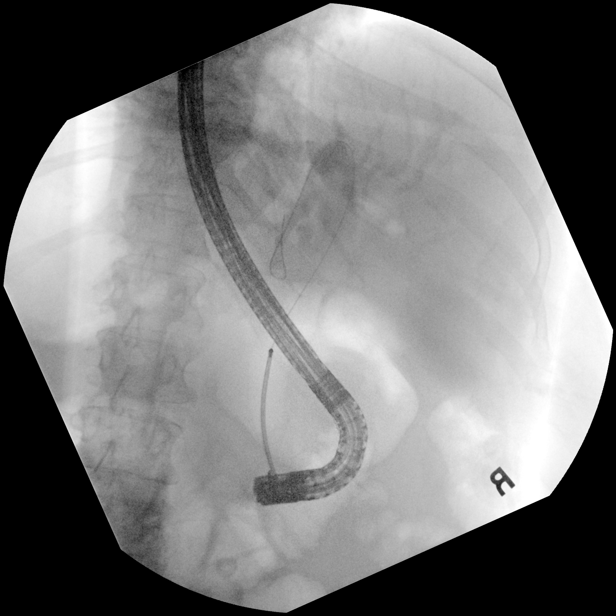
[im 12/21]
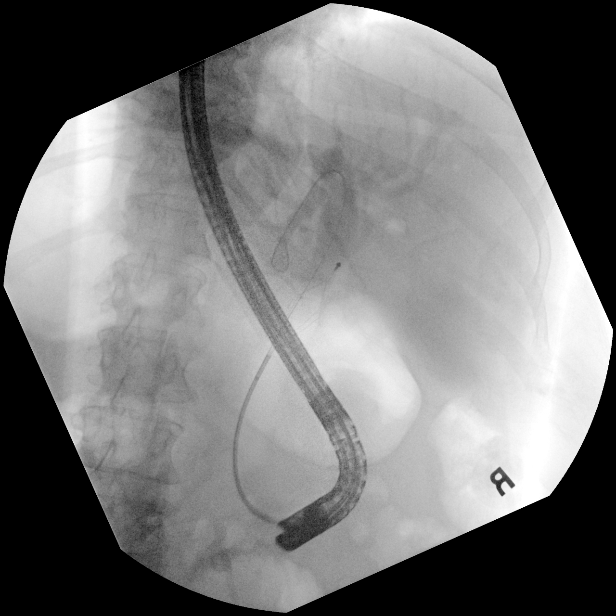
[im 14/21]
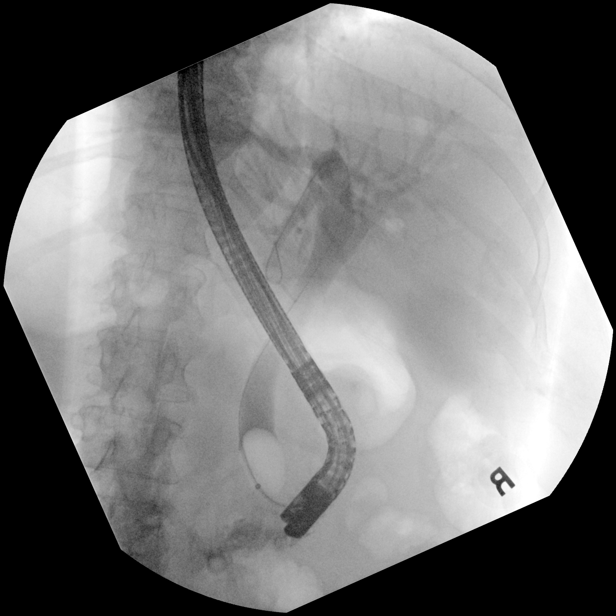
[im 15/21]
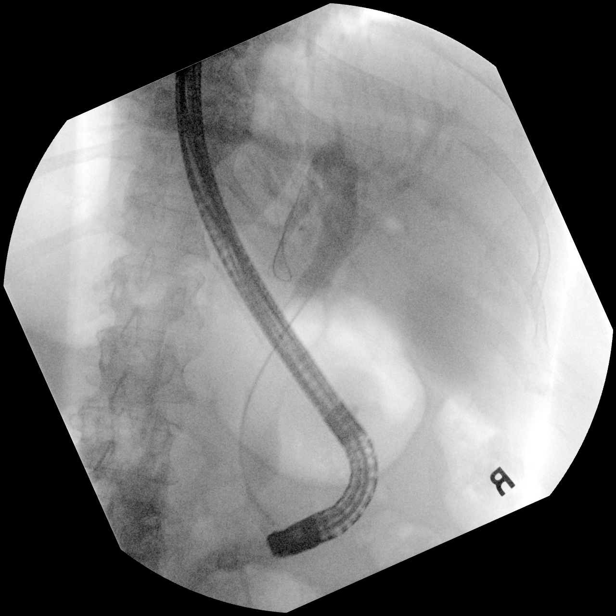
[im 17/21]
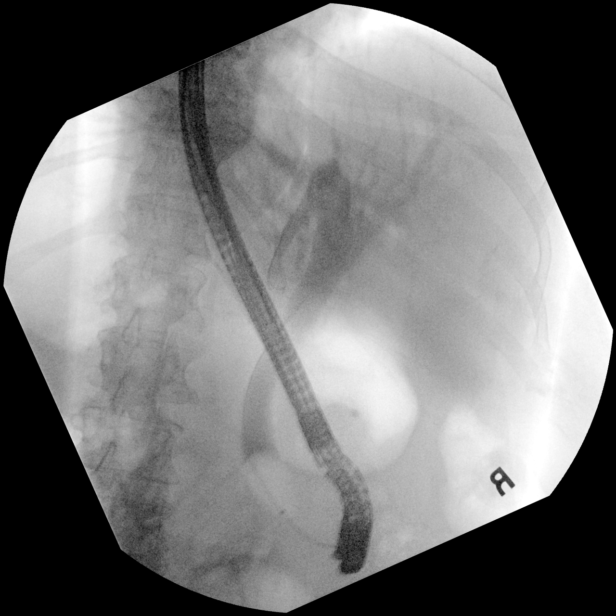
[im 18/21]
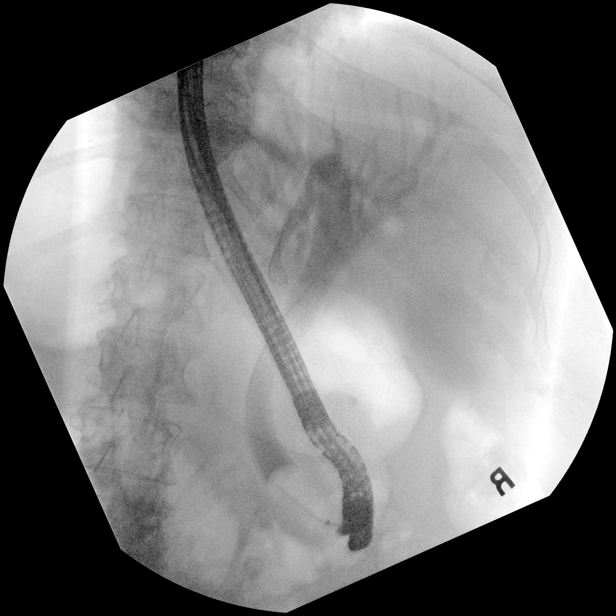
[im 19/21]
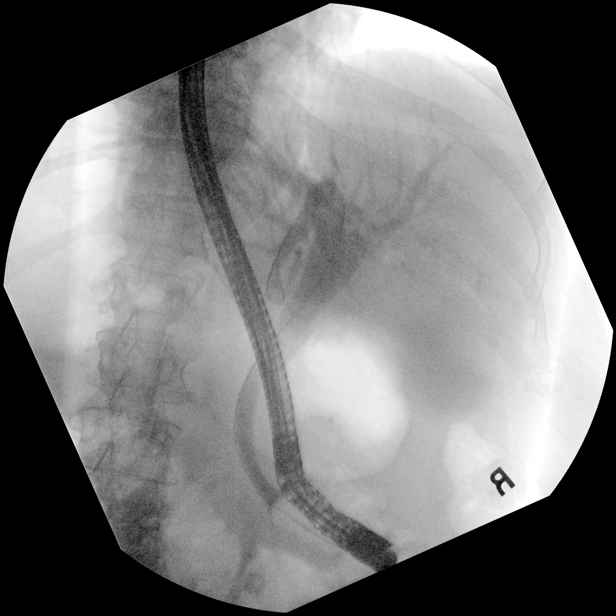
[im 21/21]
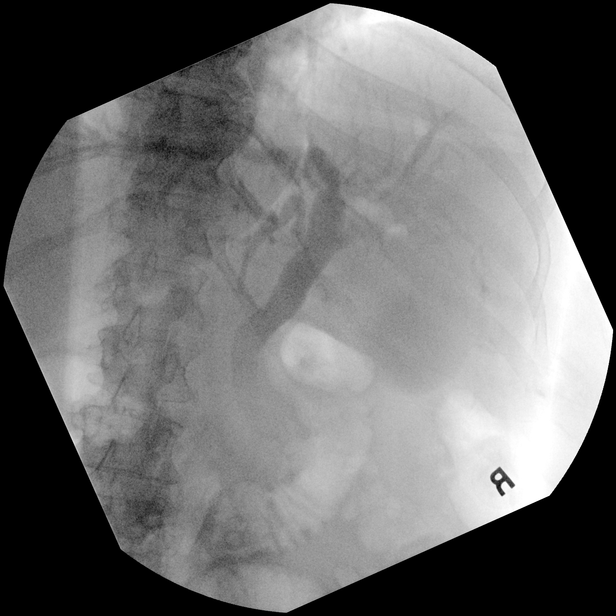

[15 of 21 positions shown; findings below may reference images not displayed]

FINDINGS: Retrograde cholangiogram demonstrates a dilated extrahepatic biliary
system. Evidence for stone retrieval using a basket.
IMPRESSION: Dilated biliary system with evidence of stone retrieval.

These images were submitted for radiologic interpretation only.
Please see the procedural report for the amount of contrast and the
fluoroscopy time utilized.

## 2019-12-14 SURGERY — ENDOSCOPIC RETROGRADE CHOLANGIOPANCREATOGRAPHY (ERCP) WITH PROPOFOL
Anesthesia: General

## 2019-12-14 MED ORDER — ROCURONIUM BROMIDE 100 MG/10ML IV SOLN
INTRAVENOUS | Status: DC | PRN
  Administered 2019-12-14 (×2): 50 mg via INTRAVENOUS

## 2019-12-14 MED ORDER — INDOMETHACIN 50 MG RE SUPP
RECTAL | Status: AC
  Filled 2019-12-14: qty 2

## 2019-12-14 MED ORDER — SUGAMMADEX SODIUM 200 MG/2ML IV SOLN
INTRAVENOUS | Status: DC | PRN
  Administered 2019-12-14: 100 mg via INTRAVENOUS
  Administered 2019-12-14: 200 mg via INTRAVENOUS

## 2019-12-14 MED ORDER — CIPROFLOXACIN IN D5W 400 MG/200ML IV SOLN
INTRAVENOUS | Status: AC
  Filled 2019-12-14: qty 200

## 2019-12-14 MED ORDER — OMEPRAZOLE 40 MG PO CPDR: 40.0000 mg | DELAYED_RELEASE_CAPSULE | Freq: Every day | ORAL | 6 refills | Status: DC

## 2019-12-14 MED ORDER — SODIUM CHLORIDE 0.9 % IV SOLN
INTRAVENOUS | Status: DC | PRN
  Administered 2019-12-14: 90 mL

## 2019-12-14 MED ORDER — LACTATED RINGERS IV SOLN: INTRAVENOUS | Status: DC | PRN

## 2019-12-14 MED ORDER — LIDOCAINE 2% (20 MG/ML) 5 ML SYRINGE
INTRAMUSCULAR | Status: DC | PRN
  Administered 2019-12-14: 100 mg via INTRAVENOUS

## 2019-12-14 MED ORDER — GLUCAGON HCL RDNA (DIAGNOSTIC) 1 MG IJ SOLR
INTRAMUSCULAR | Status: DC | PRN
  Administered 2019-12-14 (×6): .25 mg via INTRAVENOUS

## 2019-12-14 MED ORDER — DEXAMETHASONE SODIUM PHOSPHATE 10 MG/ML IJ SOLN
INTRAMUSCULAR | Status: DC | PRN
  Administered 2019-12-14: 5 mg via INTRAVENOUS

## 2019-12-14 MED ORDER — PROPOFOL 10 MG/ML IV BOLUS
INTRAVENOUS | Status: DC | PRN
  Administered 2019-12-14: 150 mg via INTRAVENOUS

## 2019-12-14 MED ORDER — EPHEDRINE SULFATE-NACL 50-0.9 MG/10ML-% IV SOSY
PREFILLED_SYRINGE | INTRAVENOUS | Status: DC | PRN
  Administered 2019-12-14: 10 mg via INTRAVENOUS
  Administered 2019-12-14 (×2): 5 mg via INTRAVENOUS
  Administered 2019-12-14: 10 mg via INTRAVENOUS

## 2019-12-14 MED ORDER — ONDANSETRON HCL 4 MG/2ML IJ SOLN
INTRAMUSCULAR | Status: DC | PRN
  Administered 2019-12-14: 4 mg via INTRAVENOUS

## 2019-12-14 MED ORDER — PHENYLEPHRINE 40 MCG/ML (10ML) SYRINGE FOR IV PUSH (FOR BLOOD PRESSURE SUPPORT)
PREFILLED_SYRINGE | INTRAVENOUS | Status: DC | PRN
  Administered 2019-12-14 (×2): 80 ug via INTRAVENOUS
  Administered 2019-12-14: 40 ug via INTRAVENOUS
  Administered 2019-12-14: 80 ug via INTRAVENOUS
  Administered 2019-12-14: 40 ug via INTRAVENOUS
  Administered 2019-12-14 (×2): 80 ug via INTRAVENOUS
  Administered 2019-12-14: 40 ug via INTRAVENOUS

## 2019-12-14 MED ORDER — FENTANYL CITRATE (PF) 100 MCG/2ML IJ SOLN
INTRAMUSCULAR | Status: AC
  Filled 2019-12-14: qty 2

## 2019-12-14 MED ORDER — CIPROFLOXACIN IN D5W 400 MG/200ML IV SOLN
INTRAVENOUS | Status: DC | PRN
  Administered 2019-12-14: 400 mg via INTRAVENOUS

## 2019-12-14 MED ORDER — FENTANYL CITRATE (PF) 250 MCG/5ML IJ SOLN
INTRAMUSCULAR | Status: DC | PRN
  Administered 2019-12-14: 100 ug via INTRAVENOUS

## 2019-12-14 MED ORDER — SODIUM CHLORIDE 0.9 % IV SOLN: INTRAVENOUS | Status: DC

## 2019-12-14 MED ORDER — PHENYLEPHRINE HCL-NACL 10-0.9 MG/250ML-% IV SOLN
INTRAVENOUS | Status: DC | PRN
  Administered 2019-12-14: 40 ug/min via INTRAVENOUS

## 2019-12-14 MED ORDER — GLUCAGON HCL RDNA (DIAGNOSTIC) 1 MG IJ SOLR
INTRAMUSCULAR | Status: AC
  Filled 2019-12-14: qty 1

## 2019-12-14 NOTE — Anesthesia Postprocedure Evaluation (Signed)
Anesthesia Post Note  Patient: Tranice Laduke  Procedure(s) Performed: ENDOSCOPIC RETROGRADE CHOLANGIOPANCREATOGRAPHY (ERCP) WITH PROPOFOL (N/A ) SPHINCTEROTOMY BIOPSY REMOVAL OF STONES BILIARY DILATION     Patient location during evaluation: PACU Anesthesia Type: General Level of consciousness: awake and alert Pain management: pain level controlled Vital Signs Assessment: post-procedure vital signs reviewed and stable Respiratory status: spontaneous breathing, nonlabored ventilation, respiratory function stable and patient connected to nasal cannula oxygen Cardiovascular status: blood pressure returned to baseline and stable Postop Assessment: no apparent nausea or vomiting Anesthetic complications: no   No complications documented.  Last Vitals:  Vitals:   12/14/19 1017 12/14/19 1026  BP: (!) 146/74 (!) 151/79  Pulse: 93 93  Resp: (!) 21 20  Temp:    SpO2: 93% 92%    Last Pain:  Vitals:   12/14/19 1026  TempSrc:   PainSc: 3                  Cydney Alvarenga

## 2019-12-14 NOTE — Op Note (Signed)
[facility] Patient Name: Hannah Beard Procedure Date : 12/14/2019 MRN: [id number] Attending MD: Justice Britain , MD Date of Birth: Jul 15, 1955 CSN: [id number] Age: 64 Admit Type: Inpatient Procedure:                ERCP Indications:              Bile duct stone(s), Biliary dilation on magnetic                            resonance cholangiopancreatography, Bile duct stone                            on magnetic resonance cholangiopancreatography,                            Abnormal liver function test Providers:                Justice Britain, MD, Kary Kos, RN, Cletis Athens, Technician, Lesia Sago, Technician,                            Reather Laurence, CRNA Referring MD:             Nicolette Bang Medicines:                General Anesthesia, Cipro 400 mg IV, Indomethacin                            100 mg PR, Glucagon 1.5 mg IV Complications:            No immediate complications. Estimated Blood Loss:     Estimated blood loss was minimal. Procedure:                Pre-Anesthesia Assessment:                           - Prior to the procedure, a History and Physical                            was performed, and patient medications and                            allergies were reviewed. The patient's tolerance of                            previous anesthesia was also reviewed. The risks                            and benefits of the procedure and the sedation                            options and risks were discussed with the patient.  All questions were answered, and informed consent                            was obtained. Prior Anticoagulants: The patient has                            taken no previous anticoagulant or antiplatelet                            agents. ASA Grade Assessment: III - A patient with                            severe systemic disease. After reviewing the risks                             and benefits, the patient was deemed in                            satisfactory condition to undergo the procedure.                           After obtaining informed consent, the scope was                            passed under direct vision. Throughout the                            procedure, the patient's blood pressure, pulse, and                            oxygen saturations were monitored continuously. The                            GIF-H190 ([id number]) Olympus gastroscope was                            introduced through the mouth, and used to inject                            contrast into and used to locate the major papilla.                            The TJF- Q180V ([id number]) Olympus duodenoscope was                            introduced through the mouth, and used to inject                            contrast into and used to inject contrast into the                            bile duct. The ERCP was technically difficult and  complex. Successful completion of the procedure was                            aided by performing the maneuvers documented                            (below) in this report. The patient tolerated the                            procedure. Scope In: Scope Out: Findings:      The scout film was normal.      The upper GI tract was traversed under direct vision without detailed       examination. No gross lesions were noted in the entire esophagus. The       Z-line was regular and was found 41 cm from the incisors. Patchy mild       inflammation characterized by erosions and erythema was found in the       gastric body and in the gastric antrum. No other gross lesions were       noted in the entire examined stomach. Biopsies were taken in the cardia,       on the greater curvature of the gastric body, on the lesser curvature of       the gastric body, at the incisura and in the gastric antrum through the        esophagogastroduodenoscope with the cold forceps for histology and HP       evaluation. No gross lesions were noted in the duodenal bulb, in the       first portion of the duodenum and in the second portion of the duodenum.       The major papilla was congested, erythematous, edematous, and ulcerated       - suggestive of passed stones recently.      A short 0.035 inch Soft Jagwire was passed into the biliary tree. The       Autotome sphincterotome was passed over the guidewire and the bile duct       was then deeply cannulated. Contrast was injected. I personally       interpreted the bile duct images. Ductal flow of contrast was adequate.       Image quality was adequate. Contrast extended to the hepatic ducts.       Opacification of the entire biliary tree except for the gallbladder was       successful. The main bile duct was severely dilated. The largest       diameter was 18-20 mm. The lower third of the main bile duct and middle       third of the main bile duct contained filling defects thought to be       stones. An 8 mm biliary sphincterotomy was made with a monofilament       Autotome sphincterotome using ERBE electrocautery - this was the maximum       I could do due to the flat appearance otherwise of the biliary tree       today. There was no post-sphincterotomy bleeding. Dilation of the common       bile duct with a CRE 08-23-08 mm balloon (to a maximum balloon size of 10       mm) dilator was successful to the distal duct and the sphincterotomy  site as a sphincteroplasty for a total of 4-minutes. The biliary tree       was swept with a 9-12 mm retrieval balloon and then due to the size of       the duct a 15-18 mm starting at the bifurcation. Nine stones were       removed eventually after significant balloon trawls. No stones remained.       To discover objects further and ensure nothing had been missed, the       biliary tree was swept with a basket starting at  the bifurcation and       coming distally and closing at times no further stones were found or       removed. An occlusion cholangiogram was performed that showed no further       significant biliary pathology.      A pancreatogram was not performed.      The duodenoscope was withdrawn from the patient. Impression:               EGD Impression:                           - No gross lesions in esophagus. Z-line regular, 41                            cm from the incisors.                           - Gastritis. No other gross lesions in the stomach.                            Biopsied to rule out HP.                           - No gross lesions in the duodenal bulb, in the                            first portion of the duodenum and in the second                            portion of the duodenum.                           - The major papilla appeared congested,                            erythematous, edematous, ulcerated - suggestive of                            passed stones.                           - The entire main bile duct was severely dilated                            with filling defects consistent with a stone was  seen on the cholangiogram.                           - A biliary sphincterotomy was performed.                           - Choledocholithiasis was found. Complete removal                            was accomplished by biliary sphincterotomy, balloon                            sphincteroplasty, trawling with balloon and basket. Recommendation:           - The patient will be observed post-procedure,                            until all discharge criteria are met.                           - Discharge patient to home.                           - Patient has a contact number available for                            emergencies. The signs and symptoms of potential                            delayed complications were discussed with the                             patient. Return to normal activities tomorrow.                            Written discharge instructions were provided to the                            patient.                           - Follow up pathology.                           - Start Omeprazole 40 mg daily.                           - Low fat diet for 2 weeks.                           - Observe patient's clinical course.                           - Watch for pancreatitis, bleeding, perforation,                            and cholangitis.                           -  Monitor right hand swelling in region of previous                            IV site (noted by our Anesthesia team during                            procedure and required replacement of IV into                            another area to continue procedure).                           - Observe patient's clinical course.                           - Check liver enzymes (AST, ALT, alkaline                            phosphatase, bilirubin) in 1-2 weeks.                           - Surgery referral for cholecystectomy in setting                            of choledocholithiasis for definitive treatment now                            s/p ERCP (hopefully sooner rather than later to                            decrease risk of recurrent stone disease in CBD.                           - The findings and recommendations were discussed                            with the patient.                           - The findings and recommendations were discussed                            with the patient's family. Procedure Code(s):        --- Professional ---                           [phone], Endoscopic retrograde                            cholangiopancreatography (ERCP); with removal of                            calculi/debris from biliary/pancreatic duct(s)                           43239, Esophagogastroduodenoscopy,  flexible,                             transoral; with biopsy, single or multiple Diagnosis Code(s):        --- Professional ---                           K29.70, Gastritis, unspecified, without bleeding                           K83.8, Other specified diseases of biliary tract                           K80.50, Calculus of bile duct without cholangitis                            or cholecystitis without obstruction                           K83.9, Disease of biliary tract, unspecified                           R94.5, Abnormal results of liver function studies                           R93.2, Abnormal findings on diagnostic imaging of                            liver and biliary tract CPT copyright 2019 American Medical Association. All rights reserved. The codes documented in this report are preliminary and upon coder review may  be revised to meet current compliance requirements. Justice Britain, MD 12/14/2019 10:21:30 AM Number of Addenda: 0

## 2019-12-14 NOTE — Transfer of Care (Signed)
Immediate Anesthesia Transfer of Care Note  Patient: Hannah Beard  Procedure(s) Performed: ENDOSCOPIC RETROGRADE CHOLANGIOPANCREATOGRAPHY (ERCP) WITH PROPOFOL (N/A ) SPHINCTEROTOMY BIOPSY REMOVAL OF STONES BILIARY DILATION  Patient Location: Endoscopy Unit  Anesthesia Type:General  Level of Consciousness: drowsy  Airway & Oxygen Therapy: Patient Spontanous Breathing and Patient connected to face mask oxygen  Post-op Assessment: Report given to RN and Post -op Vital signs reviewed and stable  Post vital signs: Reviewed  Last Vitals:  Vitals Value Taken Time  BP    Temp    Pulse    Resp    SpO2      Last Pain:  Vitals:   12/14/19 0700  TempSrc: Oral  PainSc: 0-No pain         Complications: No complications documented.

## 2019-12-14 NOTE — H&P (Signed)
GASTROENTEROLOGY PROCEDURE H&P NOTE   Primary Care Physician: Nicolette Bang, DO  HPI: Hannah Beard is a 64 y.o. female who presents for ERCP for Choledocholithiasis.  Past Medical History:  Diagnosis Date  . (HFpEF) heart failure with preserved ejection fraction ([place]) 02/2014   volume excess in setting of STEMI and volume resuscitation (EF preserved) post cardiac cath;  last echo 02-20-2014  ef 55-60%. G2DD  . Arthritis    back L3-S1  . Bilateral cataracts    MD just watching  . Bladder cancer ([place])   . CAD (coronary artery disease) previous cardiologist Dr Burt Knack, lov note in epic 04-28-2014  (03-13-2019  per pt has not seen any doctor in approx. 5 yrs ago, but denies any cardiac s&s)   a. 02/19/14 inf STEMI s/p DES to LCx and moderate stenosis midRCA  . Eczema   . History of pleural empyema    left side  08-30-2009 s/p  left VATS w/ drainage/ decortication  . History of ST elevation myocardial infarction (STEMI) 02/19/2014  . HLD (hyperlipidemia)   . Hypertension    03-13-2019  was on medication 5 yrs ago but none since this time due to not having seen any doctor for 5 yrs due to insurance issue's  . Pneumonia    x  1  . Pre-diabetes    diet controlled - no meds  . S/P drug eluting coronary stent placement    02-20-2004   PCI and DES x1 to LCx  . Wears glasses    Past Surgical History:  Procedure Laterality Date  . CARDIAC CATHETERIZATION  02/19/2014   left  . CYSTOSCOPY WITH BIOPSY N/A 04/29/2019   Procedure: CYSTOSCOPY WITH BLADDER BIOPSY/ RIGHT STENT REMOVAL;  Surgeon: Ceasar Mons, MD;  Location: [facility];  Service: Urology;  Laterality: N/A;  . DILATION AND CURETTAGE OF UTERUS  yrs ago  . LEFT HEART CATH N/A 02/19/2014   Procedure: LEFT HEART CATH;  Surgeon: Blane Ohara, MD;  Location: [facility] CATH LAB;  Service: Cardiovascular;  Laterality: N/A;  . TRANSURETHRAL RESECTION OF BLADDER TUMOR N/A 03/18/2019   Procedure:  TRANSURETHRAL RESECTION OF BLADDER TUMOR (TURBT) WITH CYSTOSCOPY RIGHT STENT PLACEMENT/ INTRAVESICAL INSTILLATION OF GEMCITABINE;  Surgeon: Ceasar Mons, MD;  Location: [facility];  Service: Urology;  Laterality: N/A;  . TRANSURETHRAL RESECTION OF BLADDER TUMOR N/A 09/23/2019   Procedure: Cystoscopy with TURBT (small) and resection of right ureteral orifice, Right JJ stent placement; Right retrograde pyelogram with intraoperative interpretation of fluoroscopic imaging post operative instillation of gemcitabine;  Surgeon: Ceasar Mons, MD;  Location: [facility];  Service: Urology;  Laterality: N/A;  . TUBAL LIGATION Bilateral yrs ago  . VIDEO ASSISTED THORACOSCOPY (VATS)/EMPYEMA Left 08-30-2009   dr hendrickson  @[facility]    Current Facility-Administered Medications  Medication Dose Route Frequency Provider Last Rate Last Admin  . 0.9 %  sodium chloride infusion   Intravenous Continuous Mansouraty, Telford Nab., MD       No Known Allergies Family History  Problem Relation Age of Onset  . Cancer Mother   . Diabetes Mother   . Heart disease Mother   . Hyperlipidemia Mother   . Hypertension Mother   . Heart disease Maternal Grandmother   . Heart failure Maternal Grandmother   . Heart disease Maternal Grandfather   . Diabetes Maternal Grandfather   . Heart attack Maternal Grandfather   . Cirrhosis Paternal Grandmother   . Diabetes Brother   .  Heart disease Brother   . Hyperlipidemia Brother   . Hypertension Brother   . Diabetes Brother   . Hyperlipidemia Brother   . Hypertension Brother   . Stroke Neg Hx   . Colon cancer Neg Hx   . Pancreatic cancer Neg Hx   . Stomach cancer Neg Hx   . Esophageal cancer Neg Hx   . Inflammatory bowel disease Neg Hx   . Liver disease Neg Hx   . Rectal cancer Neg Hx    Social History   Socioeconomic History  . Marital status: Single    Spouse name: Not on file  . Number of children: Not on file  .  Years of education: Not on file  . Highest education level: Not on file  Occupational History  . Not on file  Tobacco Use  . Smoking status: Former Smoker    Years: 20.00    Types: Cigarettes    Quit date: 08/28/2009    Years since quitting: 10.3  . Smokeless tobacco: Never Used  Vaping Use  . Vaping Use: Never used  Substance and Sexual Activity  . Alcohol use: Yes    Alcohol/week: 0.0 standard drinks    Comment: occasional  . Drug use: Never  . Sexual activity: Not on file  Other Topics Concern  . Not on file  Social History Narrative   ** Merged History Encounter **       Social Determinants of Health   Financial Resource Strain:   . Difficulty of Paying Living Expenses: Not on file  Food Insecurity:   . Worried About Charity fundraiser in the Last Year: Not on file  . Ran Out of Food in the Last Year: Not on file  Transportation Needs:   . Lack of Transportation (Medical): Not on file  . Lack of Transportation (Non-Medical): Not on file  Physical Activity:   . Days of Exercise per Week: Not on file  . Minutes of Exercise per Session: Not on file  Stress:   . Feeling of Stress : Not on file  Social Connections:   . Frequency of Communication with Friends and Family: Not on file  . Frequency of Social Gatherings with Friends and Family: Not on file  . Attends Religious Services: Not on file  . Active Member of Clubs or Organizations: Not on file  . Attends Archivist Meetings: Not on file  . Marital Status: Not on file  Intimate Partner Violence:   . Fear of Current or Ex-Partner: Not on file  . Emotionally Abused: Not on file  . Physically Abused: Not on file  . Sexually Abused: Not on file    Physical Exam: Vital signs in last 24 hours: Temp:  [98.6 F (37 C)] 98.6 F (37 C) (11/29 0700) Pulse Rate:  [103] 103 (11/29 0700) Resp:  [17] 17 (11/29 0700) BP: (163)/(87) 163/87 (11/29 0700) SpO2:  [96 %] 96 % (11/29 0700) Weight:  [107.5 kg]  107.5 kg (11/29 0700)   GEN: NAD EYE: Sclerae anicteric ENT: MMM CV: Non-tachycardic GI: Soft, NT/ND NEURO:  Alert & Oriented x 3  Lab Results: No results for input(s): WBC, HGB, HCT, PLT in the last 72 hours. BMET No results for input(s): NA, K, CL, CO2, GLUCOSE, BUN, CREATININE, CALCIUM in the last 72 hours. LFT No results for input(s): PROT, ALBUMIN, AST, ALT, ALKPHOS, BILITOT, BILIDIR, IBILI in the last 72 hours. PT/INR No results for input(s): LABPROT, INR in the last 72 hours.  Impression / Plan: This is a 64 y.o.female  who presents for ERCP for Choledocholithiasis.  The risks of an ERCP were discussed at length, including but not limited to the risk of perforation, bleeding, abdominal pain, post-ERCP pancreatitis (while usually mild can be severe and even life threatening).  The risks and benefits of endoscopic evaluation were discussed with the patient; these include but are not limited to the risk of perforation, infection, bleeding, missed lesions, lack of diagnosis, severe illness requiring hospitalization, as well as anesthesia and sedation related illnesses.  The patient is agreeable to proceed.    Justice Britain, MD [facility] Office # [phone]

## 2019-12-14 NOTE — Discharge Instructions (Signed)
YOU HAD AN ENDOSCOPIC PROCEDURE TODAY: Refer to the procedure report and other information in the discharge instructions given to you for any specific questions about what was found during the examination. If this information does not answer your questions, please call [place] office at [phone] to clarify.   YOU SHOULD EXPECT: Some feelings of bloating in the abdomen. Passage of more gas than usual. Walking can help get rid of the air that was put into your GI tract during the procedure and reduce the bloating. If you had a lower endoscopy (such as a colonoscopy or flexible sigmoidoscopy) you may notice spotting of blood in your stool or on the toilet paper. Some abdominal soreness may be present for a day or two, also.  DIET: Your first meal following the procedure should be a light meal and then it is ok to progress to your normal diet. A half-sandwich or bowl of soup is an example of a good first meal. Heavy or fried foods are harder to digest and may make you feel nauseous or bloated. Drink plenty of fluids but you should avoid alcoholic beverages for 24 hours. If you had a esophageal dilation, please see attached instructions for diet.    ACTIVITY: Your care partner should take you home directly after the procedure. You should plan to take it easy, moving slowly for the rest of the day. You can resume normal activity the day after the procedure however YOU SHOULD NOT DRIVE, use power tools, machinery or perform tasks that involve climbing or major physical exertion for 24 hours (because of the sedation medicines used during the test).   SYMPTOMS TO REPORT IMMEDIATELY: A gastroenterologist can be reached at any hour. Please call [phone]  for any of the following symptoms:   Following upper endoscopy (EGD, EUS, ERCP, esophageal dilation) Vomiting of blood or coffee ground material  New, significant abdominal pain  New, significant chest pain or pain under the shoulder blades  Painful or  persistently difficult swallowing  New shortness of breath  Black, tarry-looking or red, bloody stools  FOLLOW UP:  If any biopsies were taken you will be contacted by phone or by letter within the next 1-3 weeks. Call [phone]  if you have not heard about the biopsies in 3 weeks.  Please also call with any specific questions about appointments or follow up tests.  

## 2019-12-14 NOTE — Anesthesia Procedure Notes (Signed)
Procedure Name: Intubation Date/Time: 12/14/2019 7:41 AM Performed by: Janene Harvey, CRNA Pre-anesthesia Checklist: Patient identified, Emergency Drugs available, Suction available and Patient being monitored Patient Re-evaluated:Patient Re-evaluated prior to induction Oxygen Delivery Method: Circle system utilized Preoxygenation: Pre-oxygenation with 100% oxygen Induction Type: IV induction Ventilation: Mask ventilation without difficulty and Oral airway inserted - appropriate to patient size Laryngoscope Size: Mac and 4 Grade View: Grade I Tube type: Oral Tube size: 7.0 mm Number of attempts: 1 Airway Equipment and Method: Stylet and Oral airway Placement Confirmation: ETT inserted through vocal cords under direct vision,  positive ETCO2 and breath sounds checked- equal and bilateral Secured at: 22 cm Tube secured with: Tape Dental Injury: Teeth and Oropharynx as per pre-operative assessment

## 2019-12-15 ENCOUNTER — Telehealth: Payer: Self-pay

## 2019-12-15 DIAGNOSIS — R7989 Other specified abnormal findings of blood chemistry: Secondary | ICD-10-CM

## 2019-12-15 DIAGNOSIS — K838 Other specified diseases of biliary tract: Secondary | ICD-10-CM

## 2019-12-15 DIAGNOSIS — R945 Abnormal results of liver function studies: Secondary | ICD-10-CM

## 2019-12-15 LAB — SURGICAL PATHOLOGY

## 2019-12-15 NOTE — Telephone Encounter (Signed)
Referral to [facility] has been made. Lab order has been entered.  Follow up scheduled for 02/02/20 at 910 am with Dr Rush Landmark.  The pt has been advised of all the information and will call our office if she has not heard from the surgeon  office in 1 week. Records have been faxed.

## 2019-12-15 NOTE — Telephone Encounter (Signed)
-----   Message from [name]., MD sent at 12/15/2019 12:04 AM EST ----- Chong Sicilian, This patient needs a surgery referral for cholecystectomy in setting of choledocholithiasis and cholelithiasis. Please set her up for a hepatic function panel to be drawn when she comes in for her colonoscopy in a couple of weeks. Clinic follow-up in 6 weeks. Thanks. GM

## 2019-12-17 ENCOUNTER — Encounter: Payer: Self-pay | Admitting: Gastroenterology

## 2019-12-17 ENCOUNTER — Other Ambulatory Visit: Payer: Self-pay | Admitting: Internal Medicine

## 2019-12-25 ENCOUNTER — Ambulatory Visit: Payer: Self-pay | Admitting: General Surgery

## 2019-12-28 ENCOUNTER — Other Ambulatory Visit (HOSPITAL_COMMUNITY): Payer: 59

## 2019-12-29 ENCOUNTER — Other Ambulatory Visit (INDEPENDENT_AMBULATORY_CARE_PROVIDER_SITE_OTHER): Payer: 59

## 2019-12-29 ENCOUNTER — Ambulatory Visit (AMBULATORY_SURGERY_CENTER): Payer: 59 | Admitting: Gastroenterology

## 2019-12-29 ENCOUNTER — Other Ambulatory Visit: Payer: 59

## 2019-12-29 ENCOUNTER — Encounter: Payer: Self-pay | Admitting: Gastroenterology

## 2019-12-29 ENCOUNTER — Other Ambulatory Visit: Payer: Self-pay

## 2019-12-29 VITALS — BP 124/72 | HR 91 | Temp 97.8°F | Resp 14 | Ht 63.5 in | Wt 242.0 lb

## 2019-12-29 DIAGNOSIS — D124 Benign neoplasm of descending colon: Secondary | ICD-10-CM

## 2019-12-29 DIAGNOSIS — D125 Benign neoplasm of sigmoid colon: Secondary | ICD-10-CM

## 2019-12-29 DIAGNOSIS — D128 Benign neoplasm of rectum: Secondary | ICD-10-CM

## 2019-12-29 DIAGNOSIS — Z1211 Encounter for screening for malignant neoplasm of colon: Secondary | ICD-10-CM

## 2019-12-29 DIAGNOSIS — D127 Benign neoplasm of rectosigmoid junction: Secondary | ICD-10-CM | POA: Diagnosis not present

## 2019-12-29 DIAGNOSIS — R945 Abnormal results of liver function studies: Secondary | ICD-10-CM

## 2019-12-29 DIAGNOSIS — D122 Benign neoplasm of ascending colon: Secondary | ICD-10-CM | POA: Diagnosis not present

## 2019-12-29 DIAGNOSIS — R7989 Other specified abnormal findings of blood chemistry: Secondary | ICD-10-CM

## 2019-12-29 DIAGNOSIS — K838 Other specified diseases of biliary tract: Secondary | ICD-10-CM

## 2019-12-29 LAB — HEPATIC FUNCTION PANEL
ALT: 25 U/L (ref 0–35)
AST: 23 U/L (ref 0–37)
Albumin: 4 g/dL (ref 3.5–5.2)
Alkaline Phosphatase: 139 U/L — ABNORMAL HIGH (ref 39–117)
Bilirubin, Direct: 0.1 mg/dL (ref 0.0–0.3)
Total Bilirubin: 0.5 mg/dL (ref 0.2–1.2)
Total Protein: 7.9 g/dL (ref 6.0–8.3)

## 2019-12-29 MED ORDER — SODIUM CHLORIDE 0.9 % IV SOLN: 500.0000 mL | Freq: Once | INTRAVENOUS | Status: DC

## 2019-12-29 NOTE — Progress Notes (Signed)
Called to room to assist during endoscopic procedure.  Patient ID and intended procedure confirmed with present staff. Received instructions for my participation in the procedure from the performing physician.  

## 2019-12-29 NOTE — Op Note (Signed)
[facility] Patient Name: Hannah Beard Procedure Date: 12/29/2019 8:02 AM MRN: [id number] Endoscopist: Gabriel Mansouraty , MD Age: 64 Referring MD:  Date of Birth: 03/23/1955 Gender: Female Account #: [id number] Procedure:                Colonoscopy Indications:              Screening for colorectal malignant neoplasm Medicines:                Monitored Anesthesia Care (though was able to watch                            withdrawal throughout based on patient preference Procedure:                Pre-Anesthesia Assessment:                           - Prior to the procedure, a History and Physical                            was performed, and patient medications and                            allergies were reviewed. The patient's tolerance of                            previous anesthesia was also reviewed. The risks                            and benefits of the procedure and the sedation                            options and risks were discussed with the patient.                            All questions were answered, and informed consent                            was obtained. Prior Anticoagulants: The patient has                            taken no previous anticoagulant or antiplatelet                            agents. ASA Grade Assessment: III - A patient with                            severe systemic disease. After reviewing the risks                            and benefits, the patient was deemed in                            satisfactory condition to undergo the procedure.                             After obtaining informed consent, the colonoscope                            was passed under direct vision. Throughout the                            procedure, the patient's blood pressure, pulse, and                            oxygen saturations were monitored continuously. The                            Colonoscope was introduced through the anus and                             advanced to the 5 cm into the ileum. The                            colonoscopy was performed without difficulty. The                            patient tolerated the procedure. The quality of the                            bowel preparation was good. The terminal ileum,                            ileocecal valve, appendiceal orifice, and rectum                            were photographed. Scope In: 8:10:47 AM Scope Out: 8:33:24 AM Scope Withdrawal Time: 0 hours 19 minutes 29 seconds  Total Procedure Duration: 0 hours 22 minutes 37 seconds  Findings:                 The digital rectal exam findings include                            hemorrhoids. Pertinent negatives include no                            palpable rectal lesions.                           The terminal ileum and ileocecal valve appeared                            normal.                           Nine sessile polyps were found in the rectum (2),                            recto-sigmoid colon (1), sigmoid colon (2),  descending colon (1) and ascending colon (3). The                            polyps were 2 to 10 mm in size. These polyps were                            removed with a cold snare. Resection and retrieval                            were complete.                           Multiple small-mouthed diverticula were found in                            the recto-sigmoid colon, sigmoid colon and                            descending colon.                           Normal mucosa was found in the entire colon                            otherwise.                           Non-bleeding non-thrombosed external and internal                            hemorrhoids were found during retroflexion, during                            perianal exam and during digital exam. The                            hemorrhoids were Grade II (internal hemorrhoids                            that  prolapse but reduce spontaneously). Complications:            No immediate complications. Estimated Blood Loss:     Estimated blood loss was minimal. Impression:               - Hemorrhoids found on digital rectal exam.                           - The examined portion of the ileum was normal.                           - Nine 2 to 10 mm polyps in the rectum, at the                            recto-sigmoid colon, in the sigmoid colon, in the  descending colon and in the ascending colon,                            removed with a cold snare. Resected and retrieved.                           - Diverticulosis in the recto-sigmoid colon, in the                            sigmoid colon and in the descending colon.                           - Normal mucosa in the entire examined colon                            otherwise.                           - Non-bleeding non-thrombosed external and internal                            hemorrhoids. Recommendation:           - The patient will be observed post-procedure,                            until all discharge criteria are met.                           - Discharge patient to home.                           - Patient has a contact number available for                            emergencies. The signs and symptoms of potential                            delayed complications were discussed with the                            patient. Return to normal activities tomorrow.                            Written discharge instructions were provided to the                            patient.                           - High fiber diet.                           - Continue present medications.                           - Await pathology results.                           -   Repeat colonoscopy in 3 years for surveillance                            based on pathology results.                           - The findings and recommendations  were discussed                            with the patient. Gabriel Mansouraty, MD 12/29/2019 8:41:23 AM 

## 2019-12-29 NOTE — Progress Notes (Signed)
Report given to PACU, vss 

## 2019-12-29 NOTE — Progress Notes (Signed)
Patient had an ERCP 2 weeks ago with Dr. Rush Landmark.

## 2019-12-29 NOTE — Patient Instructions (Signed)
Handouts provided on polyps, diverticulosis, hemorrhoids and high-fiber diet.   YOU HAD AN ENDOSCOPIC PROCEDURE TODAY AT THE [facility]:   Refer to the procedure report that was given to you for any specific questions about what was found during the examination.  If the procedure report does not answer your questions, please call your gastroenterologist to clarify.  If you requested that your care partner not be given the details of your procedure findings, then the procedure report has been included in a sealed envelope for you to review at your convenience later.  YOU SHOULD EXPECT: Some feelings of bloating in the abdomen. Passage of more gas than usual.  Walking can help get rid of the air that was put into your GI tract during the procedure and reduce the bloating. If you had a lower endoscopy (such as a colonoscopy or flexible sigmoidoscopy) you may notice spotting of blood in your stool or on the toilet paper. If you underwent a bowel prep for your procedure, you may not have a normal bowel movement for a few days.  Please Note:  You might notice some irritation and congestion in your nose or some drainage.  This is from the oxygen used during your procedure.  There is no need for concern and it should clear up in a day or so.  SYMPTOMS TO REPORT IMMEDIATELY:   Following lower endoscopy (colonoscopy or flexible sigmoidoscopy):  Excessive amounts of blood in the stool  Significant tenderness or worsening of abdominal pains  Swelling of the abdomen that is new, acute  Fever of 100F or higher  For urgent or emergent issues, a gastroenterologist can be reached at any hour by calling [phone]. Do not use MyChart messaging for urgent concerns.    DIET:  We do recommend a small meal at first, but then you may proceed to your regular diet.  Drink plenty of fluids but you should avoid alcoholic beverages for 24 hours.  ACTIVITY:  You should plan to take it easy for the rest  of today and you should NOT DRIVE or use heavy machinery until tomorrow (because of the sedation medicines used during the test).    FOLLOW UP: Our staff will call the number listed on your records 48-72 hours following your procedure to check on you and address any questions or concerns that you may have regarding the information given to you following your procedure. If we do not reach you, we will leave a message.  We will attempt to reach you two times.  During this call, we will ask if you have developed any symptoms of COVID 19. If you develop any symptoms (ie: fever, flu-like symptoms, shortness of breath, cough etc.) before then, please call [phone].  If you test positive for Covid 19 in the 2 weeks post procedure, please call and report this information to us.    If any biopsies were taken you will be contacted by phone or by letter within the next 1-3 weeks.  Please call us at [phone] if you have not heard about the biopsies in 3 weeks.    SIGNATURES/CONFIDENTIALITY: You and/or your care partner have signed paperwork which will be entered into your electronic medical record.  These signatures attest to the fact that that the information above on your After Visit Summary has been reviewed and is understood.  Full responsibility of the confidentiality of this discharge information lies with you and/or your care-partner.  

## 2019-12-30 ENCOUNTER — Other Ambulatory Visit: Payer: Self-pay

## 2019-12-30 DIAGNOSIS — R7989 Other specified abnormal findings of blood chemistry: Secondary | ICD-10-CM

## 2019-12-31 ENCOUNTER — Telehealth: Payer: Self-pay

## 2019-12-31 NOTE — Telephone Encounter (Signed)
  Follow up Call-  Call back number 12/29/2019  Post procedure Call Back phone  # [phone]  Permission to leave phone message Yes  Some recent data might be hidden     Patient questions:  Do you have a fever, pain , or abdominal swelling? No. Pain Score  0 *  Have you tolerated food without any problems? Yes.    Have you been able to return to your normal activities? Yes.    Do you have any questions about your discharge instructions: Diet   No. Medications  No. Follow up visit  No.  Do you have questions or concerns about your Care? No.  Actions: * If pain score is 4 or above: No action needed, pain <4.  1. Have you developed a fever since your procedure? no  2.   Have you had an respiratory symptoms (SOB or cough) since your procedure? no  3.   Have you tested positive for COVID 19 since your procedure no  4.   Have you had any family members/close contacts diagnosed with the COVID 19 since your procedure?  no   If yes to any of these questions please route to Joylene John, RN and Joella Prince, RN

## 2020-01-04 ENCOUNTER — Encounter: Payer: Self-pay | Admitting: Gastroenterology

## 2020-02-02 ENCOUNTER — Other Ambulatory Visit: Payer: Self-pay

## 2020-02-02 ENCOUNTER — Ambulatory Visit (INDEPENDENT_AMBULATORY_CARE_PROVIDER_SITE_OTHER): Payer: 59 | Admitting: Gastroenterology

## 2020-02-02 ENCOUNTER — Encounter: Payer: Self-pay | Admitting: Gastroenterology

## 2020-02-02 VITALS — BP 123/70 | HR 83 | Ht 63.0 in | Wt 249.0 lb

## 2020-02-02 DIAGNOSIS — Z860101 Personal history of adenomatous and serrated colon polyps: Secondary | ICD-10-CM

## 2020-02-02 DIAGNOSIS — K838 Other specified diseases of biliary tract: Secondary | ICD-10-CM

## 2020-02-02 DIAGNOSIS — R945 Abnormal results of liver function studies: Secondary | ICD-10-CM

## 2020-02-02 DIAGNOSIS — K8020 Calculus of gallbladder without cholecystitis without obstruction: Secondary | ICD-10-CM | POA: Diagnosis not present

## 2020-02-02 DIAGNOSIS — K2900 Acute gastritis without bleeding: Secondary | ICD-10-CM | POA: Diagnosis not present

## 2020-02-02 DIAGNOSIS — K8050 Calculus of bile duct without cholangitis or cholecystitis without obstruction: Secondary | ICD-10-CM | POA: Diagnosis not present

## 2020-02-02 DIAGNOSIS — Z86010 Personal history of colonic polyps: Secondary | ICD-10-CM

## 2020-02-02 DIAGNOSIS — Z9889 Other specified postprocedural states: Secondary | ICD-10-CM

## 2020-02-02 DIAGNOSIS — R7989 Other specified abnormal findings of blood chemistry: Secondary | ICD-10-CM

## 2020-02-02 NOTE — Patient Instructions (Addendum)
If you are age 64 or older, your body mass index should be between 23-30. Your Body mass index is 44.11 kg/m. If this is out of the aforementioned range listed, please consider follow up with your Primary Care Provider.  If you are age 33 or younger, your body mass index should be between 19-25. Your Body mass index is 44.11 kg/m. If this is out of the aformentioned range listed, please consider follow up with your Primary Care Provider.    Take your omeprazole every other day until complete, then you may stop taking it.    Your provider has requested that you go to the basement level for lab work 1 week before your appointment in May 2022. Press "B" on the elevator. The lab is located at the first door on the left as you exit the elevator.  You will need a follow up in 4 months. Office will call at a later time to schedule.   Thank you for choosing me and [facility].  Dr. Rush Landmark

## 2020-02-02 NOTE — Progress Notes (Signed)
[place] VISIT   Primary Care Provider Nicolette Bang, DO [place] [address] [phone]  Patient Profile: Hannah Beard is a 65 y.o. female with a pmh significant for ischemic cardiomyopathy with prior PCI intervention, hypertension, hyperlipidemia, prediabetes, bladder cancer (currently undergoing BCG treatments), cholelithiasis, choledocholithiasis (status post ERCP with extraction), gastritis.  The patient presents to the [facility] for an evaluation and management of problem(s) noted below:  Problem List 1. History of choledocholithiasis   2. Calculus of gallbladder without cholecystitis without obstruction   3. Abnormal LFTs   4. Acute superficial gastritis without hemorrhage   5. Hx of adenomatous colonic polyps   6. History of ERCP   7. Dilated bile duct     History of Present Illness Please see initial consultation note for full details of HPI.    Interval History The patient returns for scheduled follow-up.  Since her last clinic visit the patient has undergone her ERCP with results as below.  Multiple stones were extracted after doing a sphincterotomy, balloon sphincteroplasty, balloon and basket trawling.  The patient subsequently underwent a colonoscopy for colon cancer screening a few weeks later and 9 polyps were removed with the vast majority of them being tubular adenomas.  Patient had repeat liver test checked which showed significant improvement overall but still a mildly elevated alkaline phosphatase.  Today, the patient states she continues to do well.  She states she has not had significant issues for a long time and so would not know what to expect unless she were to have significant nausea and vomiting like she did back in the summer 2021.  Patient wonders how long she needs to remain on omeprazole which we started in the setting of her gastritis as well as the cingulotomy that we  created.  She denies any significant pyrosis or dysphagia or odynophagia.  Patient has no abdominal pain.  Patient has restarted her intravesicular BCG.  She is planned for her cholecystectomy with Dr. Kieth Brightly next month in February 2022.  GI Review of Systems Positive as above Negative for pain, early satiety, change in bowel habits, melena, hematochezia   Review of Systems General: Denies fevers/chills/weight loss unintentionally Cardiovascular: Denies chest pain/palpitations Pulmonary: Denies shortness of breath Gastroenterological: See HPI Genitourinary: Denies darkened urine Hematological: Denies easy bruising/bleeding Endocrine: Denies temperature intolerance Dermatological: Denies jaundice  Psychological: Mood is stable   Medications Current Outpatient Medications  Medication Sig Dispense Refill  . acetaminophen (TYLENOL) 650 MG CR tablet Take 1,300 mg by mouth every 8 (eight) hours as needed for pain.    Marland Kitchen amLODipine (NORVASC) 10 MG tablet Take 1 tablet (10 mg total) by mouth daily. (Patient taking differently: Take 10 mg by mouth every evening.) 90 tablet 3  . ascorbic acid (VITAMIN C) 500 MG tablet Take 500 mg by mouth 3 (three) times daily.    Marland Kitchen atorvastatin (LIPITOR) 80 MG tablet Take 1 tablet (80 mg total) by mouth daily. (Patient taking differently: Take 80 mg by mouth every evening.) 90 tablet 3  . Cholecalciferol (VITAMIN D3) 25 MCG (1000 UT) CAPS Take 1,000 Units by mouth daily.     Marland Kitchen omeprazole (PRILOSEC) 40 MG capsule Take 1 capsule (40 mg total) by mouth daily. 30 capsule 6  . traZODone (DESYREL) 50 MG tablet Take 0.5-1 tablets (25-50 mg total) by mouth at bedtime as needed for sleep. (Patient taking differently: Take 25 mg by mouth at bedtime as needed for sleep.) 30 tablet 3  .  triamcinolone cream (KENALOG) 0.1 % Apply 1 application topically 2 (two) times daily. (Patient taking differently: Apply 1 application topically 2 (two) times daily as needed (itching).)  453.6 g 0   No current facility-administered medications for this visit.    Allergies No Known Allergies  Histories Past Medical History:  Diagnosis Date  . (HFpEF) heart failure with preserved ejection fraction ([place]) 02/2014   volume excess in setting of STEMI and volume resuscitation (EF preserved) post cardiac cath;  last echo 02-20-2014  ef 55-60%. G2DD  . Arthritis    back L3-S1  . Bilateral cataracts    MD just watching  . Bladder cancer ([place])   . CAD (coronary artery disease) previous cardiologist Dr Burt Knack, lov note in epic 04-28-2014  (03-13-2019  per pt has not seen any doctor in approx. 5 yrs ago, but denies any cardiac s&s)   a. 02/19/14 inf STEMI s/p DES to LCx and moderate stenosis midRCA  . Eczema   . History of pleural empyema    left side  08-30-2009 s/p  left VATS w/ drainage/ decortication  . History of ST elevation myocardial infarction (STEMI) 02/19/2014  . HLD (hyperlipidemia)   . Hypertension    03-13-2019  was on medication 5 yrs ago but none since this time due to not having seen any doctor for 5 yrs due to insurance issue's  . Pneumonia    x  1  . Pre-diabetes    diet controlled - no meds  . S/P drug eluting coronary stent placement    02-20-2004   PCI and DES x1 to LCx  . Wears glasses    Past Surgical History:  Procedure Laterality Date  . BILIARY DILATION  12/14/2019   Procedure: BILIARY DILATION;  Surgeon: Rush Landmark Telford Nab., MD;  Location: [place];  Service: Gastroenterology;;  . BIOPSY  12/14/2019   Procedure: BIOPSY;  Surgeon: [name]., MD;  Location: [place];  Service: Gastroenterology;;  . CARDIAC CATHETERIZATION  02/19/2014   left  . CYSTOSCOPY WITH BIOPSY N/A 04/29/2019   Procedure: CYSTOSCOPY WITH BLADDER BIOPSY/ RIGHT STENT REMOVAL;  Surgeon: Ceasar Mons, MD;  Location: [facility];  Service: Urology;  Laterality: N/A;  . DILATION AND CURETTAGE OF UTERUS  yrs ago  . ENDOSCOPIC  RETROGRADE CHOLANGIOPANCREATOGRAPHY (ERCP) WITH PROPOFOL N/A 12/14/2019   Procedure: ENDOSCOPIC RETROGRADE CHOLANGIOPANCREATOGRAPHY (ERCP) WITH PROPOFOL;  Surgeon: Rush Landmark Telford Nab., MD;  Location: [facility];  Service: Gastroenterology;  Laterality: N/A;  . ESOPHAGOGASTRODUODENOSCOPY (EGD) WITH PROPOFOL N/A 12/14/2019   Procedure: ESOPHAGOGASTRODUODENOSCOPY (EGD) WITH PROPOFOL;  Surgeon: Rush Landmark Telford Nab., MD;  Location: [place];  Service: Gastroenterology;  Laterality: N/A;  . LEFT HEART CATH N/A 02/19/2014   Procedure: LEFT HEART CATH;  Surgeon: Blane Ohara, MD;  Location: [facility];  Service: Cardiovascular;  Laterality: N/A;  . REMOVAL OF STONES  12/14/2019   Procedure: REMOVAL OF STONES;  Surgeon: Rush Landmark Telford Nab., MD;  Location: [place];  Service: Gastroenterology;;  . Joan Mayans  12/14/2019   Procedure: SPHINCTEROTOMY;  Surgeon: [name]., MD;  Location: [place];  Service: Gastroenterology;;  . TRANSURETHRAL RESECTION OF BLADDER TUMOR N/A 03/18/2019   Procedure: TRANSURETHRAL RESECTION OF BLADDER TUMOR (TURBT) WITH CYSTOSCOPY RIGHT STENT PLACEMENT/ INTRAVESICAL INSTILLATION OF GEMCITABINE;  Surgeon: Ceasar Mons, MD;  Location: [facility];  Service: Urology;  Laterality: N/A;  . TRANSURETHRAL RESECTION OF BLADDER TUMOR N/A 09/23/2019   Procedure: Cystoscopy with TURBT (small) and resection of right ureteral  orifice, Right JJ stent placement; Right retrograde pyelogram with intraoperative interpretation of fluoroscopic imaging post operative instillation of gemcitabine;  Surgeon: Ceasar Mons, MD;  Location: [facility];  Service: Urology;  Laterality: N/A;  . TUBAL LIGATION Bilateral yrs ago  . VIDEO ASSISTED THORACOSCOPY (VATS)/EMPYEMA Left 08-30-2009   dr [email or web address]  @[facility]    Social History   Socioeconomic History  . Marital status: Single    Spouse name: Not on file  . Number  of children: Not on file  . Years of education: Not on file  . Highest education level: Not on file  Occupational History  . Not on file  Tobacco Use  . Smoking status: Former Smoker    Years: 20.00    Types: Cigarettes    Quit date: 08/28/2009    Years since quitting: 10.4  . Smokeless tobacco: Never Used  Vaping Use  . Vaping Use: Never used  Substance and Sexual Activity  . Alcohol use: Yes    Alcohol/week: 0.0 standard drinks    Comment: occasional  . Drug use: Never  . Sexual activity: Not on file  Other Topics Concern  . Not on file  Social History Narrative   ** Merged History Encounter **       Social Determinants of Health   Financial Resource Strain: Not on file  Food Insecurity: Not on file  Transportation Needs: Not on file  Physical Activity: Not on file  Stress: Not on file  Social Connections: Not on file  Intimate Partner Violence: Not on file   Family History  Problem Relation Age of Onset  . Cancer Mother   . Diabetes Mother   . Heart disease Mother   . Hyperlipidemia Mother   . Hypertension Mother   . Heart disease Maternal Grandmother   . Heart failure Maternal Grandmother   . Heart disease Maternal Grandfather   . Diabetes Maternal Grandfather   . Heart attack Maternal Grandfather   . Cirrhosis Paternal Grandmother   . Diabetes Brother   . Heart disease Brother   . Hyperlipidemia Brother   . Hypertension Brother   . Diabetes Brother   . Hyperlipidemia Brother   . Hypertension Brother   . Stroke Neg Hx   . Colon cancer Neg Hx   . Pancreatic cancer Neg Hx   . Stomach cancer Neg Hx   . Esophageal cancer Neg Hx   . Inflammatory bowel disease Neg Hx   . Liver disease Neg Hx   . Rectal cancer Neg Hx    I have reviewed her medical, social, and family history in detail and updated the electronic medical record as necessary.    PHYSICAL EXAMINATION  BP 123/70   Pulse 83   Ht 5\' 3"  (1.6 m)   Wt 249 lb (112.9 kg)   LMP  (LMP Unknown)    SpO2 97%   BMI 44.11 kg/m  Wt Readings from Last 3 Encounters:  02/02/20 249 lb (112.9 kg)  12/29/19 242 lb (109.8 kg)  12/14/19 237 lb (107.5 kg)  GEN: NAD, appears stated age, doesn't appear chronically ill PSYCH: Cooperative, without pressured speech EYE: Conjunctivae pink, sclerae anicteric ENT: Masked CV: Nontachycardic RESP: No audible wheezing GI: NABS, soft, protuberant, rounded, nontender, without rebound MSK/EXT: Trace bilateral lower extremity edema SKIN: No jaundice, no spider angiomata NEURO:  Alert & Oriented x 3, no focal deficits   REVIEW OF DATA  I reviewed the following data at the time of this encounter:  GI  Procedures and Studies  November 2021 ERCP EGD Impression: - No gross lesions in esophagus. Z-line regular, 41 cm from the incisors. - Gastritis. No other gross lesions in the stomach. Biopsied to rule out HP. - No gross lesions in the duodenal bulb, in the first portion of the duodenum and in the second portion of the duodenum. - The major papilla appeared congested, erythematous, edematous, ulcerated - suggestive of passed stones. - The entire main bile duct was severely dilated with filling defects consistent with a stone was seen on the cholangiogram. - A biliary sphincterotomy was performed. - Choledocholithiasis was found. Complete removal was accomplished by biliary sphincterotomy, balloon sphincteroplasty, trawling with balloon and basket.  December 2021 colonoscopy - Hemorrhoids found on digital rectal exam. - The examined portion of the ileum was normal. - Nine 2 to 10 mm polyps in the rectum, at the recto-sigmoid colon, in the sigmoid colon, in the descending colon and in the ascending colon, removed with a cold snare. Resected and retrieved. - Diverticulosis in the recto-sigmoid colon, in the sigmoid colon and in the descending colon. - Normal mucosa in the entire examined colon otherwise. - Non-bleeding non-thrombosed external and internal  hemorrhoids.  Laboratory Studies  Reviewed those in epic and care everywhere  Imaging Studies  No new imaging studies to review   ASSESSMENT  Ms. Sukhu is a 65 y.o. female with a pmh significant for ischemic cardiomyopathy with prior PCI intervention, hypertension, hyperlipidemia, prediabetes, bladder cancer (currently undergoing BCG treatments), cholelithiasis, choledocholithiasis (status post ERCP with extraction), gastritis.  The patient is seen today for evaluation and management of:  1. History of choledocholithiasis   2. Calculus of gallbladder without cholecystitis without obstruction   3. Abnormal LFTs   4. Acute superficial gastritis without hemorrhage   5. Hx of adenomatous colonic polyps   6. History of ERCP   7. Dilated bile duct    The patient is hemodynamically and clinically stable at this time.  She has done well since her ERCP with stone extraction.  Her LFTs have continued to improve although have not completely normalized.  I suspect these will continue to normalize unless she has any other stones fall into her bile duct while she is awaiting for her gallbladder resection.  This is scheduled for next month with Dr. Kieth Brightly and will be the definitive treatment to try and minimize risk of recurrence.  She does understand that as a result of her dilated biliary tree that she may be at risk of recurrent biliary sludge stones but hopefully with the sphincterotomy and sphincteroplasty we will not see this occur.  We will be available if necessary for repeat ERCP should LFTs not continue to improve completely after completion of her cholecystectomy.  Patient had significant gastritis at the time of her procedure and she has done well with taking the medication once daily we will go to every other day dosing and subsequently stopped this.  She has no symptoms of overt pyrosis or significant dyspepsia so I think we can stop that and there is no evidence of H. pylori infection on  her biopsies.  The patient has been told that she may be at risk of postcholecystectomy diarrhea as well as postcholecystectomy gastritis/reflux and she will be on the look out further symptoms after her gallbladder is removed.  She will be due for colon cancer screening and colon polyp surveillance in 3 years.  We will plan to repeat her hepatic function panel in a few months time  after her gallbladder has been removed.  All patient questions were answered to the best of my ability, and the patient agrees to the aforementioned plan of action with follow-up as indicated.   PLAN  Proceed with scheduled cholecystectomy Repeat hepatic function panel in 3 to 4 months before next clinic visit Decrease omeprazole to 40 mg every other day and then stop an effort of trying to decrease risk of acid hypersecretion Repeat colonoscopy in 2024 for colon polyp surveillance    Orders Placed This Encounter  Procedures  . Hepatic function panel    New Prescriptions   No medications on file   Modified Medications   No medications on file    Planned Follow Up Return in about 4 months (around 06/01/2020).   Total Time in Face-to-Face and in Coordination of Care for patient including independent/personal interpretation/review of prior testing, medical history, examination, medication adjustment, communicating results with the patient directly, and documentation with the EHR is 25 minutes.   Justice Britain, MD [facility] # [phone]

## 2020-02-04 ENCOUNTER — Ambulatory Visit (INDEPENDENT_AMBULATORY_CARE_PROVIDER_SITE_OTHER): Payer: 59 | Admitting: Internal Medicine

## 2020-02-04 ENCOUNTER — Encounter: Payer: Self-pay | Admitting: Internal Medicine

## 2020-02-04 ENCOUNTER — Other Ambulatory Visit: Payer: Self-pay

## 2020-02-04 VITALS — BP 130/72 | HR 85 | Temp 98.0°F | Resp 18 | Ht 63.5 in | Wt 246.8 lb

## 2020-02-04 DIAGNOSIS — I10 Essential (primary) hypertension: Secondary | ICD-10-CM | POA: Diagnosis not present

## 2020-02-04 NOTE — Progress Notes (Signed)
  Subjective:    Hannah Beard - 65 y.o. female MRN [id number]  Date of birth: 1955-02-02  HPI  Hannah Beard is here for follow up of HTN.  Chronic HTN Disease Monitoring:  Home BP Monitoring - Forgot log. 127/71 yesterday. Sometimes higher in 140s.  Chest pain- no  Dyspnea- no Headache - no  Medications: Amlodipine 10 mg Compliance- yes Lightheadedness- no  Edema- no       Health Maintenance:  There are no preventive care reminders to display for this patient.  -  reports that she quit smoking about 10 years ago. Her smoking use included cigarettes. She quit after 20.00 years of use. She has never used smokeless tobacco. - Review of Systems: Per HPI. - Past Medical History: Patient Active Problem List   Diagnosis Date Noted  . History of choledocholithiasis 02/02/2020  . History of ERCP 02/02/2020  . Calculus of gallbladder without cholecystitis without obstruction 02/02/2020  . Acute superficial gastritis without hemorrhage 02/02/2020  . Hx of adenomatous colonic polyps 02/02/2020  . Abnormal LFTs 10/29/2019  . History of bilious vomiting with nausea 10/29/2019  . Dilated bile duct 10/29/2019  . Abnormal ultrasound of liver 10/29/2019  . Chronic diastolic CHF (congestive heart failure) ([place]) 03/07/2014  . ST elevation myocardial infarction (STEMI) of inferior wall ([place]) 02/22/2014  . Former smoker   . CKD (chronic kidney disease)   . HLD (hyperlipidemia)   . CAD (coronary artery disease)   . Bradycardia 02/21/2014  . Acute kidney injury ([place]) 02/21/2014  . Acute diastolic CHF (congestive heart failure) ([place]) 02/21/2014  . CAD (coronary artery disease), native coronary artery 02/21/2014   - Medications: reviewed and updated   Objective:   Physical Exam BP 130/72 (BP Location: Right Arm, Patient Position: Sitting, Cuff Size: Large)   Pulse 85   Temp 98 F (36.7 C) (Oral)   Resp 18   Ht 5' 3.5" (1.613 m)   Wt 246 lb 12.8 oz (111.9 kg)    LMP  (LMP Unknown)   SpO2 97%   BMI 43.03 kg/m  Physical Exam Constitutional:      General: She is not in acute distress.    Appearance: She is not diaphoretic.  HENT:     Head: Normocephalic and atraumatic.  Eyes:     Extraocular Movements: EOM normal.     Conjunctiva/sclera: Conjunctivae normal.  Cardiovascular:     Rate and Rhythm: Normal rate and regular rhythm.     Heart sounds: Normal heart sounds. No murmur heard.   Pulmonary:     Effort: Pulmonary effort is normal. No respiratory distress.     Breath sounds: Normal breath sounds.  Musculoskeletal:        General: Normal range of motion.  Skin:    General: Skin is warm and dry.  Neurological:     Mental Status: She is alert and oriented to person, place, and time.  Psychiatric:        Mood and Affect: Affect normal.        Judgment: Judgment normal.            Assessment & Plan:   1. Essential hypertension BP at goal today. Asymptomatic. Continue current regimen. Follow up in 3 months.    Phill Myron, D.O. 02/04/2020, 9:44 AM Primary Care at [facility]

## 2020-02-18 ENCOUNTER — Other Ambulatory Visit: Payer: Self-pay

## 2020-02-18 ENCOUNTER — Ambulatory Visit (INDEPENDENT_AMBULATORY_CARE_PROVIDER_SITE_OTHER): Payer: 59 | Admitting: Physician Assistant

## 2020-02-18 ENCOUNTER — Encounter: Payer: Self-pay | Admitting: Physician Assistant

## 2020-02-18 DIAGNOSIS — D485 Neoplasm of uncertain behavior of skin: Secondary | ICD-10-CM

## 2020-02-18 DIAGNOSIS — C4491 Basal cell carcinoma of skin, unspecified: Secondary | ICD-10-CM

## 2020-02-18 DIAGNOSIS — Z1283 Encounter for screening for malignant neoplasm of skin: Secondary | ICD-10-CM

## 2020-02-18 DIAGNOSIS — C44619 Basal cell carcinoma of skin of left upper limb, including shoulder: Secondary | ICD-10-CM

## 2020-02-18 HISTORY — DX: Basal cell carcinoma of skin, unspecified: C44.91

## 2020-02-18 MED ORDER — MUPIROCIN 2 % EX OINT: 1.0000 "application " | TOPICAL_OINTMENT | Freq: Two times a day (BID) | CUTANEOUS | 2 refills | Status: AC

## 2020-02-18 NOTE — Patient Instructions (Signed)

## 2020-02-22 NOTE — Progress Notes (Signed)
DUE TO COVID-19 ONLY ONE VISITOR IS ALLOWED TO COME WITH YOU AND STAY IN THE WAITING ROOM ONLY DURING PRE OP AND PROCEDURE DAY OF SURGERY. THE 1 VISITOR  MAY VISIT WITH YOU AFTER SURGERY IN YOUR PRIVATE ROOM DURING VISITING HOURS ONLY!  YOU NEED TO HAVE A COVID 19 TEST ON___2/14/2022 ____ @_______ , THIS TEST MUST BE DONE BEFORE SURGERY,  COVID TESTING SITE [address] [place] [address], IT IS ON THE RIGHT GOING OUT [address] APPROXIMATELY  2 MINUTES PAST [employer] ON THE RIGHT. ONCE YOUR COVID TEST IS COMPLETED,  PLEASE BEGIN THE QUARANTINE INSTRUCTIONS AS OUTLINED IN YOUR HANDOUT.                Hannah Beard  02/22/2020   Your procedure is scheduled on: 03/03/2020    Report to [facility] Main  Entrance   Report to admitting at     0530 AM     Call this number if you have problems the morning of surgery [phone]    REMEMBER: NO  SOLID FOOD CANDY OR GUM AFTER MIDNIGHT. CLEAR LIQUIDS UNTIL   0530AM       . NOTHING BY MOUTH EXCEPT CLEAR LIQUIDS UNTIL    . PLEASE FINISH ENSURE DRINK PER SURGEON ORDER  WHICH NEEDS TO BE COMPLETED AT   0530AM    .      CLEAR LIQUID DIET   Foods Allowed                                                                    Coffee and tea, regular and decaf                            Fruit ices (not with fruit pulp)                                      Iced Popsicles                                    Carbonated beverages, regular and diet                                    Cranberry, grape and apple juices Sports drinks like Gatorade Lightly seasoned clear broth or consume(fat free) Sugar, honey syrup ___________________________________________________________________      BRUSH YOUR TEETH MORNING OF SURGERY AND RINSE YOUR MOUTH OUT, NO CHEWING GUM CANDY OR MINTS.     Take these medicines the morning of surgery with A SIP OF WATER: pRILOSEC IF DUE TO TAKE   DO NOT TAKE ANY DIABETIC MEDICATIONS DAY OF YOUR  SURGERY                               You may not have any metal on your body including hair pins and  piercings  Do not wear jewelry, make-up, lotions, powders or perfumes, deodorant             Do not wear nail polish on your fingernails.  Do not shave  48 hours prior to surgery.              Men may shave face and neck.   Do not bring valuables to the hospital. [place].  Contacts, dentures or bridgework may not be worn into surgery.  Leave suitcase in the car. After surgery it may be brought to your room.     Patients discharged the day of surgery will not be allowed to drive home. IF YOU ARE HAVING SURGERY AND GOING HOME THE SAME DAY, YOU MUST HAVE AN ADULT TO DRIVE YOU HOME AND BE WITH YOU FOR 24 HOURS. YOU MAY GO HOME BY TAXI OR UBER OR ORTHERWISE, BUT AN ADULT MUST ACCOMPANY YOU HOME AND STAY WITH YOU FOR 24 HOURS.  Name and phone number of your driver:  Special Instructions: N/A              Please read over the following fact sheets you were given: _____________________________________________________________________  [facility] - Preparing for Surgery Before surgery, you can play an important role.  Because skin is not sterile, your skin needs to be as free of germs as possible.  You can reduce the number of germs on your skin by washing with CHG (chlorahexidine gluconate) soap before surgery.  CHG is an antiseptic cleaner which kills germs and bonds with the skin to continue killing germs even after washing. Please DO NOT use if you have an allergy to CHG or antibacterial soaps.  If your skin becomes reddened/irritated stop using the CHG and inform your nurse when you arrive at Short Stay. Do not shave (including legs and underarms) for at least 48 hours prior to the first CHG shower.  You may shave your face/neck. Please follow these instructions carefully:  1.  Shower with CHG Soap the night before surgery and the   morning of Surgery.  2.  If you choose to wash your hair, wash your hair first as usual with your  normal  shampoo.  3.  After you shampoo, rinse your hair and body thoroughly to remove the  shampoo.                           4.  Use CHG as you would any other liquid soap.  You can apply chg directly  to the skin and wash                       Gently with a scrungie or clean washcloth.  5.  Apply the CHG Soap to your body ONLY FROM THE NECK DOWN.   Do not use on face/ open                           Wound or open sores. Avoid contact with eyes, ears mouth and genitals (private parts).                       Wash face,  Genitals (private parts) with your normal soap.             6.  Wash thoroughly, paying special attention to the area where your surgery  will be performed.  7.  Thoroughly rinse your body with warm water from the neck down.  8.  DO NOT shower/wash with your normal soap after using and rinsing off  the CHG Soap.                9.  Pat yourself dry with a clean towel.            10.  Wear clean pajamas.            11.  Place clean sheets on your bed the night of your first shower and do not  sleep with pets. Day of Surgery : Do not apply any lotions/deodorants the morning of surgery.  Please wear clean clothes to the hospital/surgery center.  FAILURE TO FOLLOW THESE INSTRUCTIONS MAY RESULT IN THE CANCELLATION OF YOUR SURGERY PATIENT SIGNATURE_________________________________  NURSE SIGNATURE__________________________________  ________________________________________________________________________

## 2020-02-23 ENCOUNTER — Encounter (HOSPITAL_COMMUNITY)
Admission: RE | Admit: 2020-02-23 | Discharge: 2020-02-23 | Disposition: A | Discharge status: Home / Self Care | Payer: 59 | Source: Ambulatory Visit | Attending: General Surgery | Admitting: General Surgery

## 2020-02-23 ENCOUNTER — Encounter (HOSPITAL_COMMUNITY): Payer: Self-pay

## 2020-02-23 ENCOUNTER — Other Ambulatory Visit: Payer: Self-pay

## 2020-02-23 DIAGNOSIS — Z01812 Encounter for preprocedural laboratory examination: Secondary | ICD-10-CM | POA: Insufficient documentation

## 2020-02-23 HISTORY — DX: Unspecified malignant neoplasm of skin of unspecified upper limb, including shoulder: C44.601

## 2020-02-23 LAB — CBC WITH DIFFERENTIAL/PLATELET
Abs Immature Granulocytes: 0.02 10*3/uL (ref 0.00–0.07)
Basophils Absolute: 0 10*3/uL (ref 0.0–0.1)
Basophils Relative: 0 %
Eosinophils Absolute: 0.1 10*3/uL (ref 0.0–0.5)
Eosinophils Relative: 1 %
HCT: 44.9 % (ref 36.0–46.0)
Hemoglobin: 14.8 g/dL (ref 12.0–15.0)
Immature Granulocytes: 0 %
Lymphocytes Relative: 37 %
Lymphs Abs: 3.1 10*3/uL (ref 0.7–4.0)
MCH: 30.5 pg (ref 26.0–34.0)
MCHC: 33 g/dL (ref 30.0–36.0)
MCV: 92.4 fL (ref 80.0–100.0)
Monocytes Absolute: 0.8 10*3/uL (ref 0.1–1.0)
Monocytes Relative: 9 %
Neutro Abs: 4.5 10*3/uL (ref 1.7–7.7)
Neutrophils Relative %: 53 %
Platelets: 308 10*3/uL (ref 150–400)
RBC: 4.86 MIL/uL (ref 3.87–5.11)
RDW: 12.4 % (ref 11.5–15.5)
WBC: 8.5 10*3/uL (ref 4.0–10.5)
nRBC: 0 % (ref 0.0–0.2)

## 2020-02-23 LAB — COMPREHENSIVE METABOLIC PANEL
ALT: 25 U/L (ref 0–44)
AST: 23 U/L (ref 15–41)
Albumin: 4.1 g/dL (ref 3.5–5.0)
Alkaline Phosphatase: 120 U/L (ref 38–126)
Anion gap: 11 (ref 5–15)
BUN: 19 mg/dL (ref 8–23)
CO2: 25 mmol/L (ref 22–32)
Calcium: 9.6 mg/dL (ref 8.9–10.3)
Chloride: 105 mmol/L (ref 98–111)
Creatinine, Ser: 1.12 mg/dL — ABNORMAL HIGH (ref 0.44–1.00)
GFR, Estimated: 55 mL/min — ABNORMAL LOW (ref >60)
Glucose, Bld: 157 mg/dL — ABNORMAL HIGH (ref 70–99)
Potassium: 4.1 mmol/L (ref 3.5–5.1)
Sodium: 141 mmol/L (ref 135–145)
Total Bilirubin: 0.7 mg/dL (ref 0.3–1.2)
Total Protein: 8.1 g/dL (ref 6.5–8.1)

## 2020-02-23 NOTE — Progress Notes (Addendum)
Anesthesia Review:  PCP: DR Juleen China- LOV- 02/04/2020  Cardiologist : in past hx - DR Burt Knack  Hx of mi and stents not followed by a cardiologist since 2016  Chest x-ray : EKG :08/01/2019  Echo : Stress test: Cardiac Cath :  Activity level: can climb a flight of stairs without difficulty  Sleep Study/ CPAP : no  Fasting Blood Sugar :      / Checks Blood Sugar -- times a day:   Blood Thinner/ Instructions /Last Dose: ASA / Instructions/ Last Dose :  Tested positive on 01/11/2020 for Covid at [facility] in [place].  Attempted to call them at time of preop appt to obtain covid results to place on chart.  Attempted again at 1230pm to call.  Pt aware if I am unable to obtain I will call her and she will need to obtain printout of positive covid results and either email to me or bring in on day of surgery.   Spoke with someone at [employer] and they are to fax a copy of the pos covid test results.

## 2020-02-29 ENCOUNTER — Other Ambulatory Visit (HOSPITAL_COMMUNITY): Payer: 59

## 2020-03-01 ENCOUNTER — Telehealth: Payer: Self-pay

## 2020-03-01 NOTE — Telephone Encounter (Signed)
-----   Message from Warren Danes, [place] sent at 03/01/2020 11:26 AM EST ----- Rtc if recurs will need a 30 to excise if it does recur. Recheck 3 mo

## 2020-03-01 NOTE — Telephone Encounter (Signed)
Path to patient and she has a 31mo and aware to return sooner for  30 MIN EXC per [facility]

## 2020-03-02 NOTE — Anesthesia Preprocedure Evaluation (Signed)
Anesthesia Evaluation  Patient identified by MRN, date of birth, ID band Patient awake    Reviewed: NPO status , Patient's Chart, lab work & pertinent test results  Airway Mallampati: II  TM Distance: >3 FB Neck ROM: Full    Dental  (+) Poor Dentition, Chipped,    Pulmonary neg pulmonary ROS, former smoker,    Pulmonary exam normal        Cardiovascular hypertension, Pt. on medications + CAD, + Past MI, + Cardiac Stents (DES 06) and +CHF   Rhythm:Regular Rate:Normal     Neuro/Psych negative neurological ROS  negative psych ROS   GI/Hepatic Neg liver ROS, GERD  Medicated,Choledocholithiasis    Endo/Other  negative endocrine ROS  Renal/GU negative Renal ROS  negative genitourinary   Musculoskeletal  (+) Arthritis , Osteoarthritis,    Abdominal (+)  Abdomen: soft. Bowel sounds: normal.  Peds  Hematology negative hematology ROS (+)   Anesthesia Other Findings   Reproductive/Obstetrics                            Anesthesia Physical Anesthesia Plan  ASA: III  Anesthesia Plan: General   Post-op Pain Management:    Induction: Intravenous  PONV Risk Score and Plan: 3 and Ondansetron, Dexamethasone, Midazolam and Treatment may vary due to age or medical condition  Airway Management Planned: Mask and Oral ETT  Additional Equipment: None  Intra-op Plan:   Post-operative Plan: Extubation in OR  Informed Consent: I have reviewed the patients History and Physical, chart, labs and discussed the procedure including the risks, benefits and alternatives for the proposed anesthesia with the patient or authorized representative who has indicated his/her understanding and acceptance.     Dental advisory given  Plan Discussed with: CRNA  Anesthesia Plan Comments: (Lab Results      Component                Value               Date                      WBC                      8.5                  02/23/2020                HGB                      14.8                02/23/2020                HCT                      44.9                02/23/2020                MCV                      92.4                02/23/2020                PLT  308                 02/23/2020           Lab Results      Component                Value               Date                      NA                       141                 02/23/2020                K                        4.1                 02/23/2020                CO2                      25                  02/23/2020                GLUCOSE                  157 (H)             02/23/2020                BUN                      19                  02/23/2020                CREATININE               1.12 (H)            02/23/2020                CALCIUM                  9.6                 02/23/2020                GFRNONAA                 55 (L)              02/23/2020                GFRAA                    54 (L)              08/12/2019           hx pre-diabetes, HTN, CAD (STEMI, DES to LCx 2016) Echo 02/20/14:  - Left ventricle: The cavity size was normal. Wall thickness wasincreased in a pattern of mild LVH. Systolic function was normal. The estimated ejection fraction was in the range of 55% to 60%. Basal to mid inferior and  inferolateral severe hypokinesis. Features are consistent with a pseudonormal left ventricular filling pattern, with concomitant abnormal relaxation and increased filling pressure (grade 2 diastolic dysfunction).  - Aortic valve: There was no stenosis.  - Mitral valve: Mildly calcified annulus. There was no significant regurgitation.  - Right ventricle: The cavity size was normal. Systolic functionwas normal.  - Pulmonary arteries: No complete TR doppler jet so unable to estimate PA systolic pressure.  - Inferior vena cava: The vessel was normal in size. The respirophasic diameter changes were in  the normal range (>= 50%), consistent with normal central venous pressure.  - Pericardium, extracardiac: A trivial pericardial effusion was identified.  - Impressions:  Normal LV size with mild LV hypertrophy. EF 55%. There was basal to mid inferior and inferolateral severe hypokinesis. Moderate diastolic dysfunction. Normal RV size and systolic function.  Cardiac cath 02/19/14:  1. Acute inferoposterior/lateral MI secondary to occlusion of a dominant left circumflex, treated successfully with primary PCI using a drug-eluting stent 2. Minimal nonobstructive disease of the LAD and mild to moderate nonobstructive stenosis (50-60%) of a nondominant RCA 3. Mild segmental contraction abnormality the left ventricle with preserved overall LVEF 4. Markedly elevated left ventricular end-diastolic pressure ) )        Anesthesia Quick Evaluation

## 2020-03-03 ENCOUNTER — Ambulatory Visit (HOSPITAL_COMMUNITY)
Admission: RE | Admit: 2020-03-03 | Discharge: 2020-03-03 | Disposition: A | Discharge status: Home / Self Care | Payer: 59 | Attending: General Surgery | Admitting: General Surgery

## 2020-03-03 ENCOUNTER — Ambulatory Visit (HOSPITAL_COMMUNITY): Payer: 59 | Admitting: Anesthesiology

## 2020-03-03 ENCOUNTER — Encounter (HOSPITAL_COMMUNITY)
Admission: RE | Disposition: A | Discharge status: Home / Self Care | Payer: Self-pay | Source: Home / Self Care | Attending: General Surgery

## 2020-03-03 ENCOUNTER — Ambulatory Visit (HOSPITAL_COMMUNITY): Payer: 59 | Admitting: Physician Assistant

## 2020-03-03 ENCOUNTER — Ambulatory Visit (HOSPITAL_COMMUNITY): Payer: 59

## 2020-03-03 ENCOUNTER — Encounter (HOSPITAL_COMMUNITY): Payer: Self-pay | Admitting: General Surgery

## 2020-03-03 DIAGNOSIS — Z955 Presence of coronary angioplasty implant and graft: Secondary | ICD-10-CM | POA: Insufficient documentation

## 2020-03-03 DIAGNOSIS — Z8551 Personal history of malignant neoplasm of bladder: Secondary | ICD-10-CM | POA: Insufficient documentation

## 2020-03-03 DIAGNOSIS — Z8379 Family history of other diseases of the digestive system: Secondary | ICD-10-CM | POA: Insufficient documentation

## 2020-03-03 DIAGNOSIS — I5032 Chronic diastolic (congestive) heart failure: Secondary | ICD-10-CM | POA: Insufficient documentation

## 2020-03-03 DIAGNOSIS — Z419 Encounter for procedure for purposes other than remedying health state, unspecified: Secondary | ICD-10-CM

## 2020-03-03 DIAGNOSIS — Z809 Family history of malignant neoplasm, unspecified: Secondary | ICD-10-CM | POA: Diagnosis not present

## 2020-03-03 DIAGNOSIS — Z87891 Personal history of nicotine dependence: Secondary | ICD-10-CM | POA: Diagnosis not present

## 2020-03-03 DIAGNOSIS — Z833 Family history of diabetes mellitus: Secondary | ICD-10-CM | POA: Diagnosis not present

## 2020-03-03 DIAGNOSIS — I252 Old myocardial infarction: Secondary | ICD-10-CM | POA: Diagnosis not present

## 2020-03-03 DIAGNOSIS — I2510 Atherosclerotic heart disease of native coronary artery without angina pectoris: Secondary | ICD-10-CM | POA: Insufficient documentation

## 2020-03-03 DIAGNOSIS — K8045 Calculus of bile duct with chronic cholecystitis with obstruction: Secondary | ICD-10-CM | POA: Diagnosis not present

## 2020-03-03 DIAGNOSIS — Z79899 Other long term (current) drug therapy: Secondary | ICD-10-CM | POA: Diagnosis not present

## 2020-03-03 DIAGNOSIS — Z8249 Family history of ischemic heart disease and other diseases of the circulatory system: Secondary | ICD-10-CM | POA: Diagnosis not present

## 2020-03-03 DIAGNOSIS — Z823 Family history of stroke: Secondary | ICD-10-CM | POA: Diagnosis not present

## 2020-03-03 DIAGNOSIS — K8050 Calculus of bile duct without cholangitis or cholecystitis without obstruction: Secondary | ICD-10-CM | POA: Diagnosis present

## 2020-03-03 HISTORY — PX: CHOLECYSTECTOMY: SHX55

## 2020-03-03 IMAGING — RF DG CHOLANGIOGRAM OPERATIVE
1 series · 15 of 16 positions shown · non-contrast
Comparison: MR [DATE]

CLINICAL DATA: History of choledocholithiasis

EXAM:
INTRAOPERATIVE CHOLANGIOGRAM
TECHNIQUE: Cholangiographic images from the C-arm fluoroscopic device were
submitted for interpretation post-operatively. Please see the
procedural report for the amount of contrast and the fluoroscopy
time utilized.

[Series 1: run · 4 acquisitions, 15 frames shown]
[im 1/4]
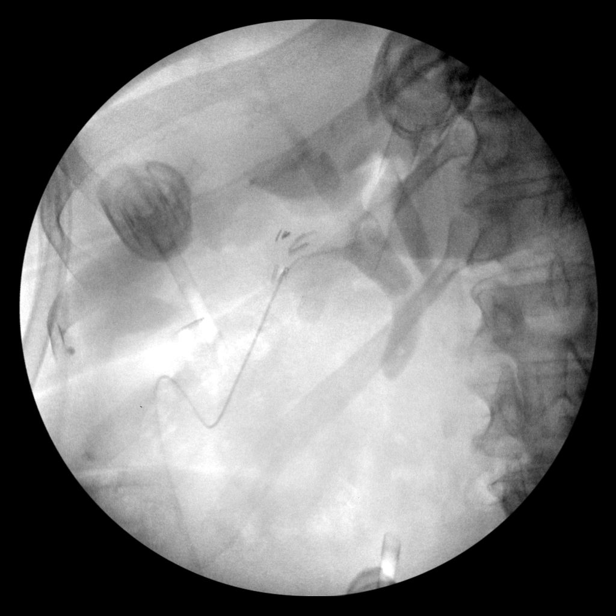
[im 1/4]
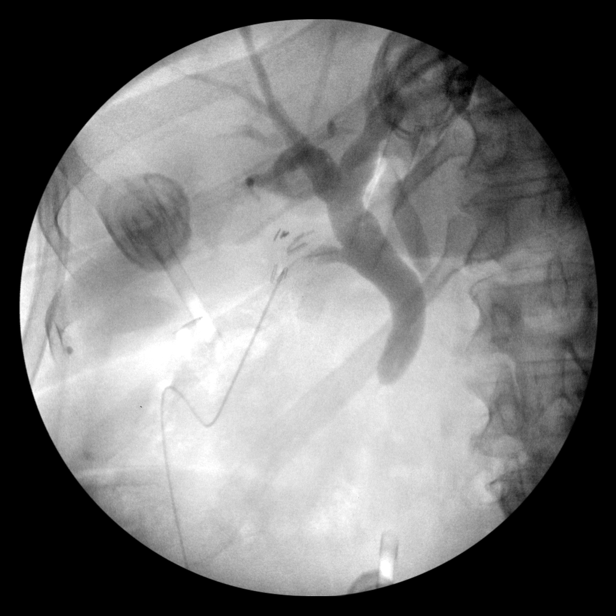
[im 1/4]
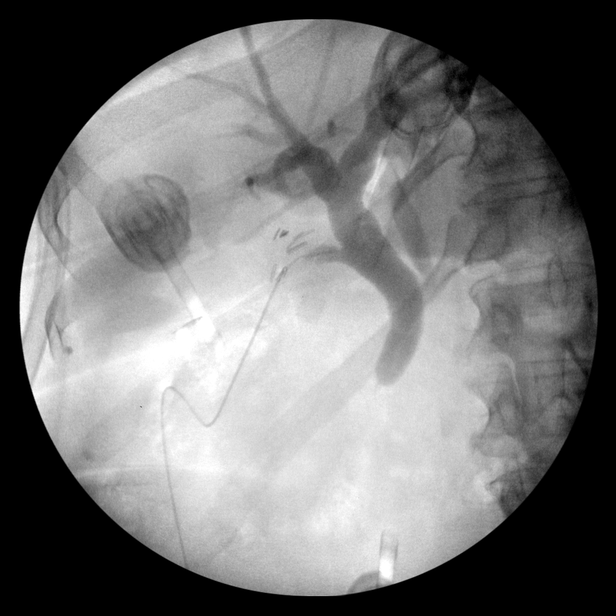
[im 1/4]
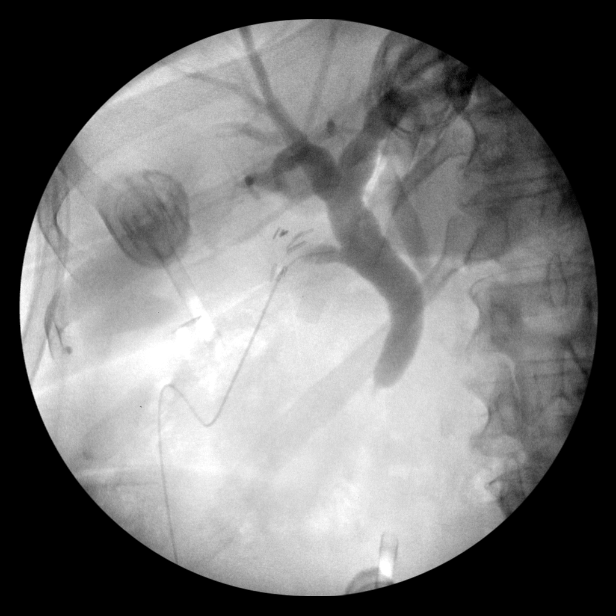
[im 2/4]
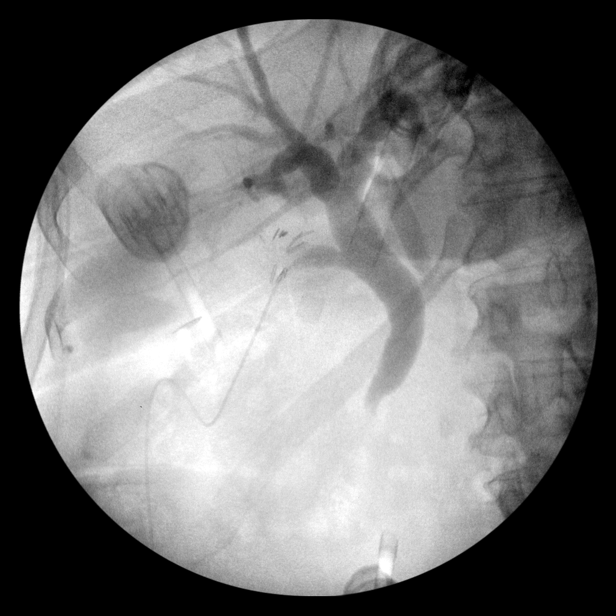
[im 2/4]
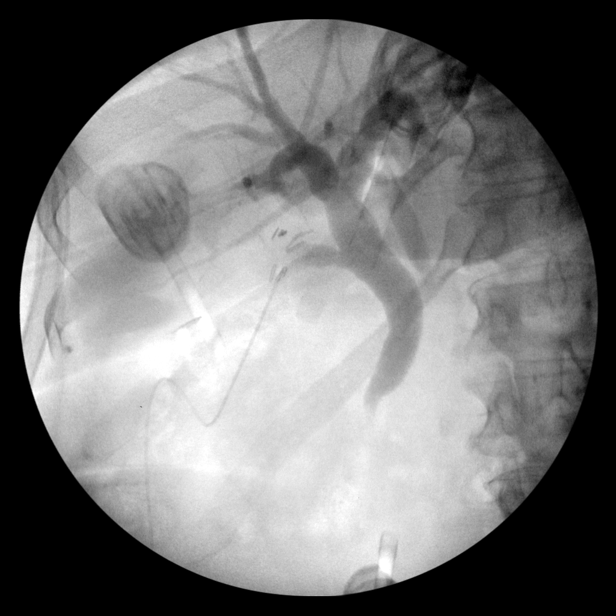
[im 2/4]
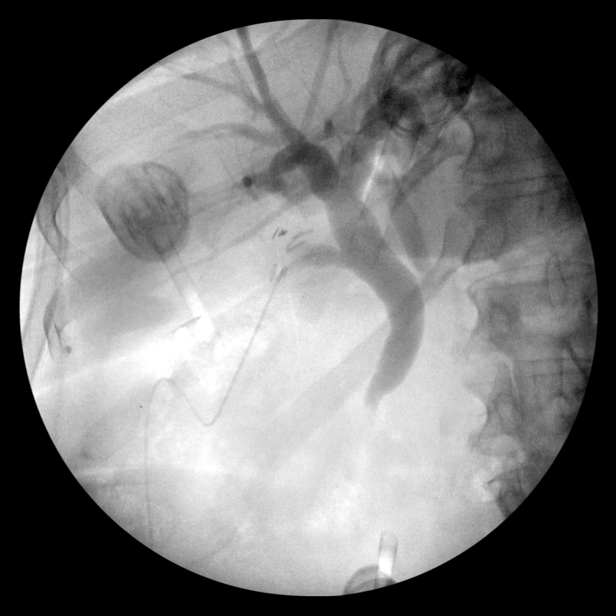
[im 3/4]
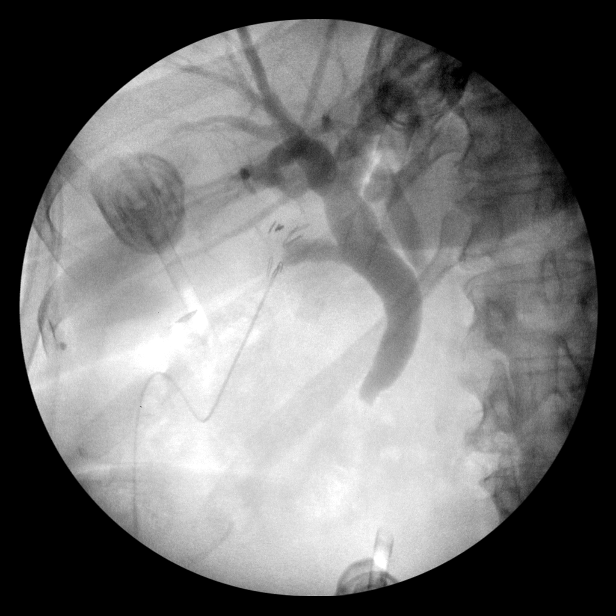
[im 3/4]
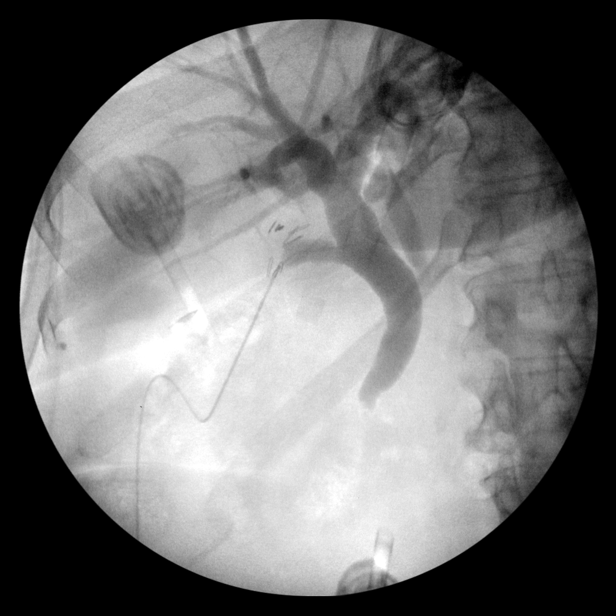
[im 3/4]
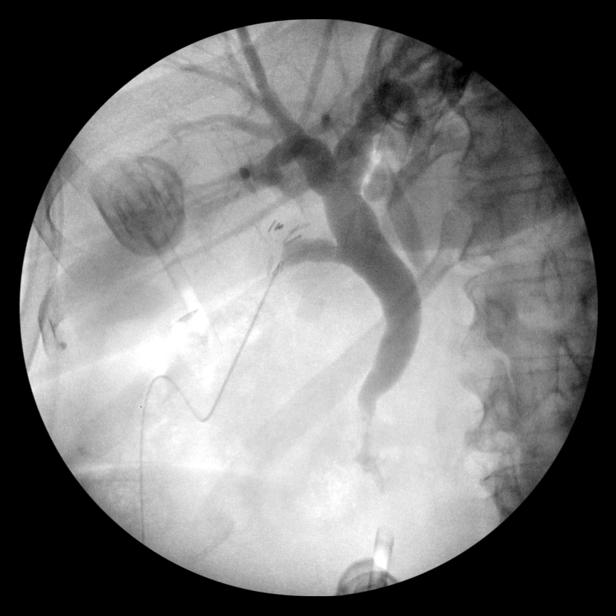
[im 3/4]
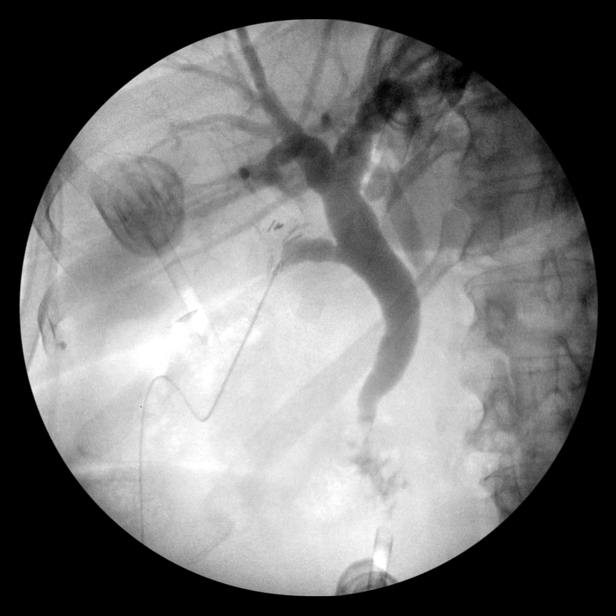
[im 4/4]
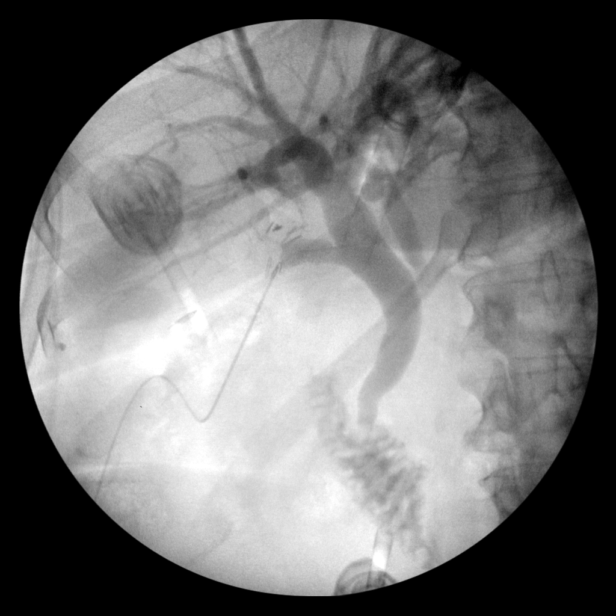
[im 4/4]
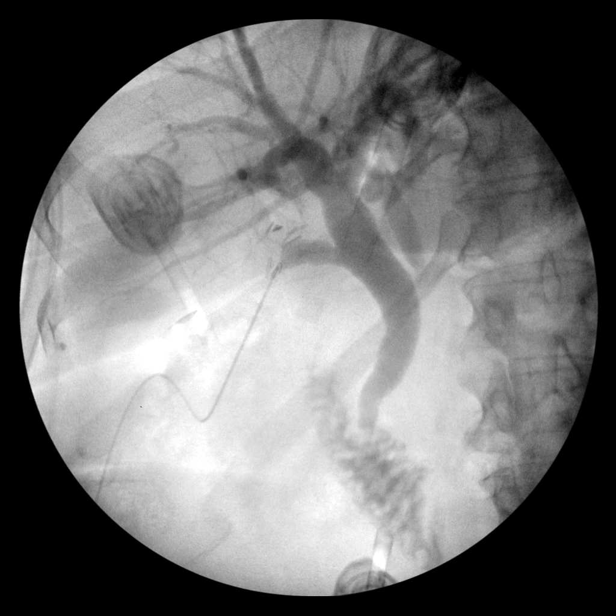
[im 4/4]
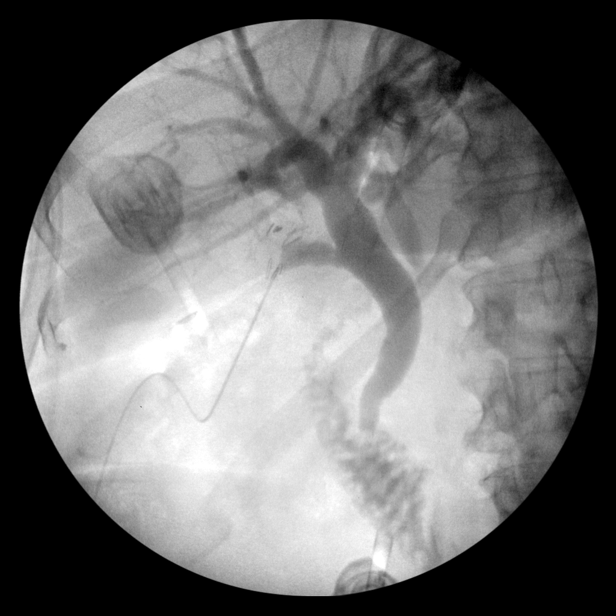
[im 4/4]
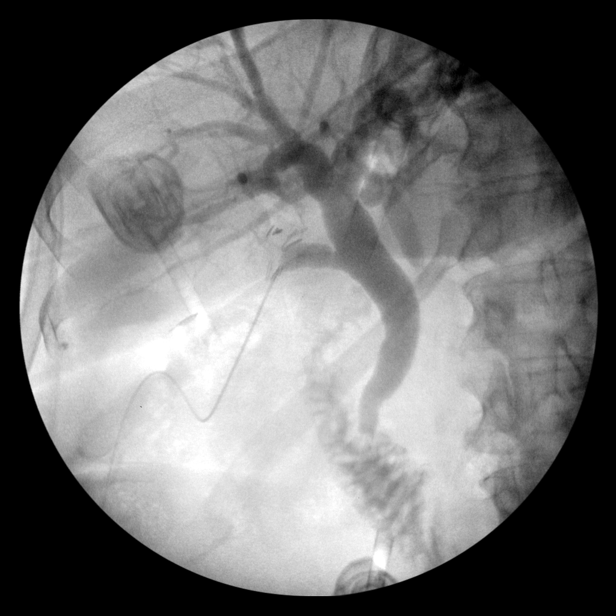

[15 of 16 positions shown; findings below may reference images not displayed]

FINDINGS: Partially obstructing filling defect in the distal CBD near the
ampulla. There is mild dilatation of the CBD and central visualized
opacified intrahepatic biliary tree. Cholecystectomy clips. No
extravasation.
IMPRESSION: 1. Suspected distal CBD partially obstructing choledocholithiasis.

## 2020-03-03 SURGERY — LAPAROSCOPIC CHOLECYSTECTOMY WITH INTRAOPERATIVE CHOLANGIOGRAM
Anesthesia: General | Site: Abdomen

## 2020-03-03 MED ORDER — LACTATED RINGERS IV SOLN: INTRAVENOUS | Status: DC

## 2020-03-03 MED ORDER — GLUCAGON HCL RDNA (DIAGNOSTIC) 1 MG IJ SOLR
INTRAMUSCULAR | Status: AC
  Filled 2020-03-03: qty 1

## 2020-03-03 MED ORDER — PROMETHAZINE HCL 25 MG/ML IJ SOLN: 6.2500 mg | INTRAMUSCULAR | Status: DC | PRN

## 2020-03-03 MED ORDER — ONDANSETRON HCL 4 MG/2ML IJ SOLN
INTRAMUSCULAR | Status: DC | PRN
  Administered 2020-03-03: 4 mg via INTRAVENOUS

## 2020-03-03 MED ORDER — ROCURONIUM BROMIDE 100 MG/10ML IV SOLN
INTRAVENOUS | Status: DC | PRN
  Administered 2020-03-03: 60 mg via INTRAVENOUS

## 2020-03-03 MED ORDER — CHLORHEXIDINE GLUCONATE CLOTH 2 % EX PADS: 6.0000 | MEDICATED_PAD | Freq: Once | CUTANEOUS | Status: DC

## 2020-03-03 MED ORDER — GLUCAGON HCL RDNA (DIAGNOSTIC) 1 MG IJ SOLR
INTRAMUSCULAR | Status: DC | PRN
  Administered 2020-03-03: 1 mg via INTRAVENOUS

## 2020-03-03 MED ORDER — PROPOFOL 10 MG/ML IV BOLUS
INTRAVENOUS | Status: AC
  Filled 2020-03-03: qty 20

## 2020-03-03 MED ORDER — IOHEXOL 300 MG/ML  SOLN
INTRAMUSCULAR | Status: DC | PRN
  Administered 2020-03-03: 19 mL

## 2020-03-03 MED ORDER — MIDAZOLAM HCL 5 MG/5ML IJ SOLN
INTRAMUSCULAR | Status: DC | PRN
  Administered 2020-03-03: 2 mg via INTRAVENOUS

## 2020-03-03 MED ORDER — HYDROMORPHONE HCL 1 MG/ML IJ SOLN
0.2500 mg | INTRAMUSCULAR | Status: DC | PRN
  Administered 2020-03-03: 0.25 mg via INTRAVENOUS
  Administered 2020-03-03: 0.5 mg via INTRAVENOUS
  Administered 2020-03-03: 0.25 mg via INTRAVENOUS

## 2020-03-03 MED ORDER — SUGAMMADEX SODIUM 200 MG/2ML IV SOLN
INTRAVENOUS | Status: DC | PRN
  Administered 2020-03-03: 400 mg via INTRAVENOUS

## 2020-03-03 MED ORDER — FENTANYL CITRATE (PF) 100 MCG/2ML IJ SOLN
INTRAMUSCULAR | Status: DC | PRN
  Administered 2020-03-03: 100 ug via INTRAVENOUS

## 2020-03-03 MED ORDER — HYDROCODONE-ACETAMINOPHEN 5-325 MG PO TABS: 1.0000 | ORAL_TABLET | Freq: Four times a day (QID) | ORAL | 0 refills | Status: DC | PRN

## 2020-03-03 MED ORDER — BUPIVACAINE-EPINEPHRINE 0.25% -1:200000 IJ SOLN
INTRAMUSCULAR | Status: DC | PRN
  Administered 2020-03-03: 30 mL

## 2020-03-03 MED ORDER — ROCURONIUM BROMIDE 10 MG/ML (PF) SYRINGE
PREFILLED_SYRINGE | INTRAVENOUS | Status: AC
  Filled 2020-03-03: qty 20

## 2020-03-03 MED ORDER — 0.9 % SODIUM CHLORIDE (POUR BTL) OPTIME
TOPICAL | Status: DC | PRN
  Administered 2020-03-03: 1000 mL

## 2020-03-03 MED ORDER — CELECOXIB 200 MG PO CAPS
400.0000 mg | ORAL_CAPSULE | ORAL | Status: AC
  Administered 2020-03-03: 400 mg via ORAL
  Filled 2020-03-03: qty 2

## 2020-03-03 MED ORDER — PROPOFOL 10 MG/ML IV BOLUS
INTRAVENOUS | Status: DC | PRN
  Administered 2020-03-03: 150 mg via INTRAVENOUS

## 2020-03-03 MED ORDER — ENSURE PRE-SURGERY PO LIQD
296.0000 mL | Freq: Once | ORAL | Status: DC
  Filled 2020-03-03: qty 296

## 2020-03-03 MED ORDER — CEFAZOLIN SODIUM-DEXTROSE 2-4 GM/100ML-% IV SOLN
2.0000 g | INTRAVENOUS | Status: AC
  Administered 2020-03-03: 2 g via INTRAVENOUS
  Filled 2020-03-03: qty 100

## 2020-03-03 MED ORDER — OXYCODONE HCL 5 MG PO TABS: 5.0000 mg | ORAL_TABLET | Freq: Once | ORAL | Status: DC | PRN

## 2020-03-03 MED ORDER — ACETAMINOPHEN 500 MG PO TABS
1000.0000 mg | ORAL_TABLET | ORAL | Status: AC
  Administered 2020-03-03: 1000 mg via ORAL
  Filled 2020-03-03: qty 2

## 2020-03-03 MED ORDER — LIDOCAINE HCL (PF) 2 % IJ SOLN
INTRAMUSCULAR | Status: AC
  Filled 2020-03-03: qty 5

## 2020-03-03 MED ORDER — LACTATED RINGERS IR SOLN
Status: DC | PRN
  Administered 2020-03-03: 1000 mL

## 2020-03-03 MED ORDER — MIDAZOLAM HCL 2 MG/2ML IJ SOLN
INTRAMUSCULAR | Status: AC
  Filled 2020-03-03: qty 2

## 2020-03-03 MED ORDER — OXYCODONE HCL 5 MG/5ML PO SOLN: 5.0000 mg | Freq: Once | ORAL | Status: DC | PRN

## 2020-03-03 MED ORDER — BUPIVACAINE-EPINEPHRINE (PF) 0.25% -1:200000 IJ SOLN
INTRAMUSCULAR | Status: AC
  Filled 2020-03-03: qty 30

## 2020-03-03 MED ORDER — DEXAMETHASONE SODIUM PHOSPHATE 10 MG/ML IJ SOLN
INTRAMUSCULAR | Status: DC | PRN
  Administered 2020-03-03: 4 mg via INTRAVENOUS

## 2020-03-03 MED ORDER — EPHEDRINE SULFATE 50 MG/ML IJ SOLN
INTRAMUSCULAR | Status: DC | PRN
  Administered 2020-03-03 (×2): 10 mg via INTRAVENOUS

## 2020-03-03 MED ORDER — IBUPROFEN 800 MG PO TABS: 800.0000 mg | ORAL_TABLET | Freq: Three times a day (TID) | ORAL | 0 refills | Status: DC | PRN

## 2020-03-03 MED ORDER — LIDOCAINE HCL (CARDIAC) PF 100 MG/5ML IV SOSY
PREFILLED_SYRINGE | INTRAVENOUS | Status: DC | PRN
  Administered 2020-03-03: 60 mg via INTRAVENOUS

## 2020-03-03 MED ORDER — CHLORHEXIDINE GLUCONATE 0.12 % MT SOLN
15.0000 mL | Freq: Once | OROMUCOSAL | Status: AC
  Administered 2020-03-03: 15 mL via OROMUCOSAL

## 2020-03-03 MED ORDER — HYDROMORPHONE HCL 1 MG/ML IJ SOLN
INTRAMUSCULAR | Status: AC
  Filled 2020-03-03: qty 1

## 2020-03-03 MED ORDER — PHENYLEPHRINE HCL (PRESSORS) 10 MG/ML IV SOLN
INTRAVENOUS | Status: DC | PRN
  Administered 2020-03-03: 40 ug via INTRAVENOUS
  Administered 2020-03-03: 80 ug via INTRAVENOUS

## 2020-03-03 MED ORDER — FENTANYL CITRATE (PF) 100 MCG/2ML IJ SOLN
INTRAMUSCULAR | Status: AC
  Filled 2020-03-03: qty 2

## 2020-03-03 SURGICAL SUPPLY — 41 items
APPLIER CLIP 5 13 M/L LIGAMAX5 (MISCELLANEOUS) ×2
APPLIER CLIP ROT 10 11.4 M/L (STAPLE)
BENZOIN TINCTURE PRP APPL 2/3 (GAUZE/BANDAGES/DRESSINGS) ×2 IMPLANT
BNDG ADH 1X3 SHEER STRL LF (GAUZE/BANDAGES/DRESSINGS) ×8 IMPLANT
CABLE HIGH FREQUENCY MONO STRZ (ELECTRODE) ×2 IMPLANT
CHLORAPREP W/TINT 26 (MISCELLANEOUS) ×2 IMPLANT
CLIP APPLIE 5 13 M/L LIGAMAX5 (MISCELLANEOUS) ×1 IMPLANT
CLIP APPLIE ROT 10 11.4 M/L (STAPLE) IMPLANT
CLIP VESOLOCK LG 6/CT PURPLE (CLIP) IMPLANT
CLIP VESOLOCK MED LG 6/CT (CLIP) IMPLANT
COVER MAYO STAND STRL (DRAPES) ×2 IMPLANT
COVER SURGICAL LIGHT HANDLE (MISCELLANEOUS) ×2 IMPLANT
COVER WAND RF STERILE (DRAPES) IMPLANT
DECANTER SPIKE VIAL GLASS SM (MISCELLANEOUS) ×2 IMPLANT
DRAIN CHANNEL 19F RND (DRAIN) IMPLANT
DRAPE C-ARM 42X120 X-RAY (DRAPES) ×2 IMPLANT
EVACUATOR SILICONE 100CC (DRAIN) IMPLANT
GLOVE SURG POLYISO LF SZ7 (GLOVE) ×2 IMPLANT
GLOVE SURG UNDER POLY LF SZ7 (GLOVE) ×2 IMPLANT
GOWN STRL REUS W/TWL LRG LVL3 (GOWN DISPOSABLE) ×4 IMPLANT
GOWN STRL REUS W/TWL XL LVL3 (GOWN DISPOSABLE) ×2 IMPLANT
GRASPER SUT TROCAR 14GX15 (MISCELLANEOUS) IMPLANT
KIT BASIN OR (CUSTOM PROCEDURE TRAY) ×2 IMPLANT
KIT TURNOVER KIT A (KITS) ×2 IMPLANT
POUCH RETRIEVAL ECOSAC 10 (ENDOMECHANICALS) ×1 IMPLANT
POUCH RETRIEVAL ECOSAC 10MM (ENDOMECHANICALS) ×2
SCISSORS LAP 5X35 DISP (ENDOMECHANICALS) ×2 IMPLANT
SET CHOLANGIOGRAPH MIX (MISCELLANEOUS) ×2 IMPLANT
SET IRRIG TUBING LAPAROSCOPIC (IRRIGATION / IRRIGATOR) ×2 IMPLANT
SET TUBE SMOKE EVAC HIGH FLOW (TUBING) ×2 IMPLANT
SLEEVE XCEL OPT CAN 5 100 (ENDOMECHANICALS) ×4 IMPLANT
STOPCOCK 4 WAY LG BORE MALE ST (IV SETS) IMPLANT
STRIP CLOSURE SKIN 1/2X4 (GAUZE/BANDAGES/DRESSINGS) ×2 IMPLANT
SUT ETHILON 2 0 PS N (SUTURE) IMPLANT
SUT MNCRL AB 4-0 PS2 18 (SUTURE) ×2 IMPLANT
SUT VICRYL 0 ENDOLOOP (SUTURE) IMPLANT
TOWEL OR 17X26 10 PK STRL BLUE (TOWEL DISPOSABLE) ×2 IMPLANT
TOWEL OR NON WOVEN STRL DISP B (DISPOSABLE) IMPLANT
TRAY LAPAROSCOPIC (CUSTOM PROCEDURE TRAY) ×2 IMPLANT
TROCAR BLADELESS OPT 5 100 (ENDOMECHANICALS) ×2 IMPLANT
TROCAR XCEL NON-BLD 11X100MML (ENDOMECHANICALS) ×2 IMPLANT

## 2020-03-03 NOTE — Transfer of Care (Signed)
Immediate Anesthesia Transfer of Care Note  Patient: Hannah Beard  Procedure(s) Performed: LAPAROSCOPIC CHOLECYSTECTOMY WITH INTRAOPERATIVE CHOLANGIOGRAM (N/A Abdomen)  Patient Location: PACU  Anesthesia Type:General  Level of Consciousness: awake and alert   Airway & Oxygen Therapy: Patient Spontanous Breathing and Patient connected to face mask oxygen  Post-op Assessment: Report given to RN and Post -op Vital signs reviewed and stable  Post vital signs: Reviewed and stable  Last Vitals:  Vitals Value Taken Time  BP 184/90 03/03/20 0918  Temp 36.9 C 03/03/20 0916  Pulse 93 03/03/20 0924  Resp 22 03/03/20 0924  SpO2 96 % 03/03/20 0924  Vitals shown include unvalidated device data.  Last Pain:  Vitals:   03/03/20 0916  TempSrc:   PainSc: 0-No pain         Complications: No complications documented.

## 2020-03-03 NOTE — H&P (Signed)
Hannah Beard is an 65 y.o. female.   Chief Complaint: abdominal pain HPI: 65 yo female with history of choledocholithiasis s/p ERCP. She presents today for gallbladder removal.  Past Medical History:  Diagnosis Date  . (HFpEF) heart failure with preserved ejection fraction ([place]) 02/2014   volume excess in setting of STEMI and volume resuscitation (EF preserved) post cardiac cath;  last echo 02-20-2014  ef 55-60%. G2DD  . Arthritis    back L3-S1  . Bilateral cataracts    MD just watching  . Bladder cancer ([place])   . CAD (coronary artery disease) previous cardiologist Dr Burt Knack, lov note in epic 04-28-2014  (03-13-2019  per pt has not seen any doctor in approx. 5 yrs ago, but denies any cardiac s&s)   a. 02/19/14 inf STEMI s/p DES to LCx and moderate stenosis midRCA  . Eczema   . History of pleural empyema    left side  08-30-2009 s/p  left VATS w/ drainage/ decortication  . History of ST elevation myocardial infarction (STEMI) 02/19/2014  . HLD (hyperlipidemia)   . Hypertension    03-13-2019  was on medication 5 yrs ago but none since this time due to not having seen any doctor for 5 yrs due to insurance issue's  . Pneumonia    x  1  . S/P drug eluting coronary stent placement    02-20-2004   PCI and DES x1 to LCx  . Skin cancer of arm   . Wears glasses     Past Surgical History:  Procedure Laterality Date  . BILIARY DILATION  12/14/2019   Procedure: BILIARY DILATION;  Surgeon: Rush Landmark Telford Nab., MD;  Location: [place];  Service: Gastroenterology;;  . BIOPSY  12/14/2019   Procedure: BIOPSY;  Surgeon: [name]., MD;  Location: [place];  Service: Gastroenterology;;  . CARDIAC CATHETERIZATION  02/19/2014   left  . CYSTOSCOPY WITH BIOPSY N/A 04/29/2019   Procedure: CYSTOSCOPY WITH BLADDER BIOPSY/ RIGHT STENT REMOVAL;  Surgeon: Ceasar Mons, MD;  Location: [facility];  Service: Urology;  Laterality: N/A;  . DILATION AND  CURETTAGE OF UTERUS  yrs ago  . ENDOSCOPIC RETROGRADE CHOLANGIOPANCREATOGRAPHY (ERCP) WITH PROPOFOL N/A 12/14/2019   Procedure: ENDOSCOPIC RETROGRADE CHOLANGIOPANCREATOGRAPHY (ERCP) WITH PROPOFOL;  Surgeon: Rush Landmark Telford Nab., MD;  Location: [place];  Service: Gastroenterology;  Laterality: N/A;  . ESOPHAGOGASTRODUODENOSCOPY (EGD) WITH PROPOFOL N/A 12/14/2019   Procedure: ESOPHAGOGASTRODUODENOSCOPY (EGD) WITH PROPOFOL;  Surgeon: Rush Landmark Telford Nab., MD;  Location: [place];  Service: Gastroenterology;  Laterality: N/A;  . LEFT HEART CATH N/A 02/19/2014   Procedure: LEFT HEART CATH;  Surgeon: Blane Ohara, MD;  Location: [facility] CATH LAB;  Service: Cardiovascular;  Laterality: N/A;  . REMOVAL OF STONES  12/14/2019   Procedure: REMOVAL OF STONES;  Surgeon: Rush Landmark Telford Nab., MD;  Location: [place];  Service: Gastroenterology;;  . skin cancer removal      02/2020 - left upper arm   . SPHINCTEROTOMY  12/14/2019   Procedure: SPHINCTEROTOMY;  Surgeon: Mansouraty, Telford Nab., MD;  Location: [place];  Service: Gastroenterology;;  . TRANSURETHRAL RESECTION OF BLADDER TUMOR N/A 03/18/2019   Procedure: TRANSURETHRAL RESECTION OF BLADDER TUMOR (TURBT) WITH CYSTOSCOPY RIGHT STENT PLACEMENT/ INTRAVESICAL INSTILLATION OF GEMCITABINE;  Surgeon: Ceasar Mons, MD;  Location: [facility];  Service: Urology;  Laterality: N/A;  . TRANSURETHRAL RESECTION OF BLADDER TUMOR N/A 09/23/2019   Procedure: Cystoscopy with TURBT (small) and resection of right ureteral orifice, Right JJ stent placement; Right  retrograde pyelogram with intraoperative interpretation of fluoroscopic imaging post operative instillation of gemcitabine;  Surgeon: Ceasar Mons, MD;  Location: [facility];  Service: Urology;  Laterality: N/A;  . TUBAL LIGATION Bilateral yrs ago  . VIDEO ASSISTED THORACOSCOPY (VATS)/EMPYEMA Left 08-30-2009   dr hendrickson  @[facility]     Family  History  Problem Relation Age of Onset  . Cancer Mother   . Diabetes Mother   . Heart disease Mother   . Hyperlipidemia Mother   . Hypertension Mother   . Heart disease Maternal Grandmother   . Heart failure Maternal Grandmother   . Heart disease Maternal Grandfather   . Diabetes Maternal Grandfather   . Heart attack Maternal Grandfather   . Cirrhosis Paternal Grandmother   . Diabetes Brother   . Heart disease Brother   . Hyperlipidemia Brother   . Hypertension Brother   . Diabetes Brother   . Hyperlipidemia Brother   . Hypertension Brother   . Stroke Neg Hx   . Colon cancer Neg Hx   . Pancreatic cancer Neg Hx   . Stomach cancer Neg Hx   . Esophageal cancer Neg Hx   . Inflammatory bowel disease Neg Hx   . Liver disease Neg Hx   . Rectal cancer Neg Hx    Social History:  reports that she quit smoking about 10 years ago. Her smoking use included cigarettes. She quit after 20.00 years of use. She has never used smokeless tobacco. She reports current alcohol use. She reports that she does not use drugs.  Allergies: No Known Allergies  Medications Prior to Admission  Medication Sig Dispense Refill  . acetaminophen (TYLENOL) 650 MG CR tablet Take 1,300 mg by mouth every 8 (eight) hours as needed for pain.    Marland Kitchen amLODipine (NORVASC) 10 MG tablet Take 1 tablet (10 mg total) by mouth daily. (Patient taking differently: Take 10 mg by mouth every evening.) 90 tablet 3  . ascorbic acid (VITAMIN C) 500 MG tablet Take 500 mg by mouth 3 (three) times daily.    Marland Kitchen atorvastatin (LIPITOR) 80 MG tablet Take 1 tablet (80 mg total) by mouth daily. (Patient taking differently: Take 80 mg by mouth every evening.) 90 tablet 3  . Cholecalciferol (VITAMIN D3) 25 MCG (1000 UT) CAPS Take 1,000 Units by mouth daily.     . mupirocin ointment (BACTROBAN) 2 % Apply 1 application topically 2 (two) times daily. 22 g 2  . omeprazole (PRILOSEC) 40 MG capsule Take 1 capsule (40 mg total) by mouth daily. (Patient  taking differently: Take 40 mg by mouth every other day.) 30 capsule 6  . traZODone (DESYREL) 50 MG tablet Take 0.5-1 tablets (25-50 mg total) by mouth at bedtime as needed for sleep. (Patient taking differently: Take 25 mg by mouth at bedtime as needed for sleep.) 30 tablet 3  . triamcinolone cream (KENALOG) 0.1 % Apply 1 application topically 2 (two) times daily. (Patient taking differently: Apply 1 application topically 2 (two) times daily as needed (itching).) 453.6 g 0    No results found for this or any previous visit (from the past 48 hour(s)). No results found.  Review of Systems  Constitutional: Negative for chills and fever.  HENT: Negative for hearing loss.   Respiratory: Negative for cough.   Cardiovascular: Negative for chest pain and palpitations.  Gastrointestinal: Negative for abdominal pain, nausea and vomiting.  Genitourinary: Negative for dysuria and urgency.  Musculoskeletal: Negative for myalgias and neck pain.  Skin: Negative  for rash.  Neurological: Negative for dizziness and headaches.  Hematological: Does not bruise/bleed easily.  Psychiatric/Behavioral: Negative for suicidal ideas.    Blood pressure (!) 189/99, pulse (!) 104, temperature 98.1 F (36.7 C), temperature source Oral, resp. rate 17, height 5' 3.5" (1.613 m), weight 113.4 kg, SpO2 96 %. Physical Exam Vitals reviewed.  Constitutional:      Appearance: She is well-developed and well-nourished.  HENT:     Head: Normocephalic and atraumatic.  Eyes:     Extraocular Movements: EOM normal.     Conjunctiva/sclera: Conjunctivae normal.     Pupils: Pupils are equal, round, and reactive to light.  Cardiovascular:     Rate and Rhythm: Normal rate and regular rhythm.  Pulmonary:     Effort: Pulmonary effort is normal.     Breath sounds: Normal breath sounds.  Abdominal:     General: Bowel sounds are normal. There is no distension.     Palpations: Abdomen is soft.     Tenderness: There is no abdominal  tenderness.  Musculoskeletal:        General: Normal range of motion.     Cervical back: Normal range of motion and neck supple.  Skin:    General: Skin is warm and dry.  Neurological:     Mental Status: She is alert and oriented to person, place, and time.  Psychiatric:        Mood and Affect: Mood and affect normal.        Behavior: Behavior normal.      Assessment/Plan 65 yo female with choledocholithiasis in the past.  -lap chole w ioc -planned outpatient procedure -ERas protocol  Mickeal Skinner, MD 03/03/2020, 7:30 AM

## 2020-03-03 NOTE — Anesthesia Postprocedure Evaluation (Signed)
Anesthesia Post Note  Patient: Hannah Beard  Procedure(s) Performed: LAPAROSCOPIC CHOLECYSTECTOMY WITH INTRAOPERATIVE CHOLANGIOGRAM (N/A Abdomen)     Patient location during evaluation: PACU Anesthesia Type: General Level of consciousness: awake and alert Pain management: pain level controlled Vital Signs Assessment: post-procedure vital signs reviewed and stable Respiratory status: spontaneous breathing, nonlabored ventilation, respiratory function stable and patient connected to nasal cannula oxygen Cardiovascular status: blood pressure returned to baseline and stable Postop Assessment: no apparent nausea or vomiting Anesthetic complications: no   No complications documented.  Last Vitals:  Vitals:   03/03/20 1000 03/03/20 1010  BP: 129/78   Pulse: 82 85  Resp: 12 16  Temp: 36.6 C 36.6 C  SpO2: 97%     Last Pain:  Vitals:   03/03/20 1000  TempSrc:   PainSc: 3                  Achsah Mcquade P Tyla Burgner

## 2020-03-03 NOTE — Op Note (Signed)
PATIENT:  [name]  65 y.o. female  PRE-OPERATIVE DIAGNOSIS:  CHOLEDOCHOLITHIASIS  POST-OPERATIVE DIAGNOSIS:  CHOLEDOCHOLITHIASIS  PROCEDURE:  Procedure(s): LAPAROSCOPIC CHOLECYSTECTOMY WITH INTRAOPERATIVE CHOLANGIOGRAM   SURGEON:  Surgeon(s): Midori Dado, Arta Bruce, MD  ASSISTANT: none  ANESTHESIA:   local and general  Indications for procedure: Hannah Beard is a 65 y.o. female with symptoms of Abdominal pain consistent with gallbladder disease, Confirmed by ultrasound.  Description of procedure: The patient was brought into the operative suite, placed supine. Anesthesia was administered with endotracheal tube. Patient was strapped in place and foot board was secured. All pressure points were offloaded by foam padding. The patient was prepped and draped in the usual sterile fashion.  A periumbilical incision was made and optical entry was used to enter the abdomen. 2 5 mm trocars were placed on in the right lateral space on in the right subcostal space. A 59mm trocar was placed in the subxiphoid space. Marcaine was infused to the subxiphoid space and lateral upper right abdomen in the transversus abdominis plane. Next the patient was placed in reverse trendelenberg. The gallbladder appeareddilated and chronically inflamed. Omentum was adhered to the gallbladder and was taken down with cautery/blunt dissection.  The gallbladder was retracted cephalad and lateral. The peritoneum was reflected off the infundibulum working lateral to medial. The cystic duct and cystic artery were identified and further dissection revealed a critical view, due to concern for choledocholithiasis a cholangiogram was performed with ductotomy and cook catheter passed through a separate subcostal stab incision. Initial view showed dilated ducts and no drainage into the duodenum. Glucagon was then given. On repeat view there was drainage into the dudoenum with question of sediment at the sphincter. The cystic  duct and cystic artery were doubly clipped and ligated.   The gallbladder was removed off the liver bed with cautery. The Gallbladder was placed in a specimen bag. The gallbladder fossa was irrigated and hemostasis was applied with cautery. The gallbladder was removed via the 104mm trocar. No dilation was required for removal, therefore no fascial closure was performed. Pneumoperitoneum was removed, all trocar were removed. All incisions were closed with 4-0 monocryl subcuticular stitch. The patient woke from anesthesia and was brought to PACU in stable condition. All counts were correct  Findings: chronically inflamed gallbladder  Specimen: gallbladder  Blood loss: 20 ml  Local anesthesia: 30 ml Marcaine  Complications: none  PLAN OF CARE: Discharge to home after PACU  PATIENT DISPOSITION:  PACU - hemodynamically stable.  Gurney Maxin, M.D. General, Bariatric, & Minimally Invasive Surgery [facility]

## 2020-03-03 NOTE — Anesthesia Procedure Notes (Signed)
Procedure Name: Intubation Date/Time: 03/03/2020 7:43 AM Performed by: [place] Sukhman Kocher C, CRNA Pre-anesthesia Checklist: Patient identified, Emergency Drugs available, Suction available, Patient being monitored and Timeout performed Patient Re-evaluated:Patient Re-evaluated prior to induction Oxygen Delivery Method: Circle system utilized Preoxygenation: Pre-oxygenation with 100% oxygen Induction Type: IV induction Ventilation: Mask ventilation without difficulty Laryngoscope Size: Mac and 4 Grade View: Grade I Tube type: Oral Tube size: 7.0 mm Number of attempts: 1 Placement Confirmation: ETT inserted through vocal cords under direct vision,  positive ETCO2,  CO2 detector and breath sounds checked- equal and bilateral Dental Injury: Teeth and Oropharynx as per pre-operative assessment

## 2020-03-03 NOTE — Discharge Instructions (Signed)
[facility] ______[facility] °LAPAROSCOPIC SURGERY: POST OP INSTRUCTIONS °Always review your discharge instruction sheet given to you by the facility where your surgery was performed. °IF YOU HAVE DISABILITY OR FAMILY LEAVE FORMS, YOU MUST BRING THEM TO THE OFFICE FOR PROCESSING.   °DO NOT GIVE THEM TO YOUR DOCTOR. ° °1. A prescription for pain medication may be given to you upon discharge.  Take your pain medication as prescribed, if needed.  If narcotic pain medicine is not needed, then you may take acetaminophen (Tylenol) or ibuprofen (Advil) as needed. °2. Take your usually prescribed medications unless otherwise directed. °3. If you need a refill on your pain medication, please contact your pharmacy.  They will contact our office to request authorization. Prescriptions will not be filled after 5pm or on week-ends. °4. You should follow a light diet the first few days after arrival home, such as soup and crackers, etc.  Be sure to include lots of fluids daily. °5. Most patients will experience some swelling and bruising in the area of the incisions.  Ice packs will help.  Swelling and bruising can take several days to resolve.  °6. It is common to experience some constipation if taking pain medication after surgery.  Increasing fluid intake and taking a stool softener (such as Colace) will usually help or prevent this problem from occurring.  A mild laxative (Milk of Magnesia or Miralax) should be taken according to package instructions if there are no bowel movements after 48 hours. °7. Unless discharge instructions indicate otherwise, you may remove your bandages 24-48 hours after surgery, and you may shower at that time.  You may have steri-strips (small skin tapes) in place directly over the incision.  These strips should be left on the skin for 7-10 days.  If your surgeon used skin glue on the incision, you may shower in 24 hours.  The glue will flake off over the next 2-3 weeks.  Any sutures or  staples will be removed at the office during your follow-up visit. °8. ACTIVITIES:  You may resume regular (light) daily activities beginning the next day--such as daily self-care, walking, climbing stairs--gradually increasing activities as tolerated.  You may have sexual intercourse when it is comfortable.  Refrain from any heavy lifting or straining until approved by your doctor. °a. You may drive when you are no longer taking prescription pain medication, you can comfortably wear a seatbelt, and you can safely maneuver your car and apply brakes. °b. RETURN TO WORK:  __________________________________________________________ °9. You should see your doctor in the office for a follow-up appointment approximately 2-3 weeks after your surgery.  Make sure that you call for this appointment within a day or two after you arrive home to insure a convenient appointment time. °10. OTHER INSTRUCTIONS: __________________________________________________________________________________________________________________________ __________________________________________________________________________________________________________________________ °WHEN TO CALL YOUR DOCTOR: °1. Fever over 101.0 °2. Inability to urinate °3. Continued bleeding from incision. °4. Increased pain, redness, or drainage from the incision. °5. Increasing abdominal pain ° °The clinic staff is available to answer your questions during regular business hours.  Please don’t hesitate to call and ask to speak to one of the nurses for clinical concerns.  If you have a medical emergency, go to the nearest emergency room or call 911.  A surgeon from [facility] is always on call at the hospital. °[address], [address], [place], [place]  [address] ? [address] [address], [place], [place]   [address] °[phone] ? [phone] ? FAX [phone] °Web site:   [email or web address] °

## 2020-03-04 ENCOUNTER — Encounter (HOSPITAL_COMMUNITY): Payer: Self-pay | Admitting: General Surgery

## 2020-03-04 LAB — SURGICAL PATHOLOGY

## 2020-03-07 ENCOUNTER — Encounter: Payer: Self-pay | Admitting: Physician Assistant

## 2020-03-07 NOTE — Progress Notes (Signed)
   New Patient   Subjective  Hannah Beard is a 65 y.o. female who presents for the following: Skin Problem (LEFT UPPER ARM BX, ALSO CYST ON BACK).   The following portions of the chart were reviewed this encounter and updated as appropriate:  Tobacco  Allergies  Meds  Problems  Med Hx  Surg Hx  Fam Hx      Objective  Well appearing patient in no apparent distress; mood and affect are within normal limits.  A full examination was performed including scalp, head, eyes, ears, nose, lips, neck, chest, axillae, abdomen, back, buttocks, bilateral upper extremities, bilateral lower extremities, hands, feet, fingers, toes, fingernails, and toenails. All findings within normal limits unless otherwise noted below.  Objective  Chest - Medial [facility]): Waist up skin examination- no atypical moles  Objective  Left Upper Arm - Posterior: Pink pearly papule     Assessment & Plan  Encounter for screening for malignant neoplasm of skin Chest - Medial ([facility])  Yearly skin check  Basal cell carcinoma (BCC) of skin of left upper extremity including shoulder Left Upper Arm - Posterior  Skin / nail biopsy Type of biopsy: tangential   Informed consent: discussed and consent obtained   Timeout: patient name, date of birth, surgical site, and procedure verified   Procedure prep:  Patient was prepped and draped in usual sterile fashion (Non sterile) Prep type:  Chlorhexidine Anesthesia: the lesion was anesthetized in a standard fashion   Anesthetic:  1% lidocaine w/ epinephrine 1-100,000 local infiltration Instrument used: flexible razor blade   Outcome: patient tolerated procedure well   Post-procedure details: wound care instructions given    Destruction of lesion Complexity: simple   Destruction method: electrodesiccation and curettage   Informed consent: discussed and consent obtained   Timeout:  patient name, date of birth, surgical site, and procedure verified Anesthesia:  the lesion was anesthetized in a standard fashion   Anesthetic:  1% lidocaine w/ epinephrine 1-100,000 local infiltration Curettage performed in three different directions: Yes   Curettage cycles:  3 Margin per side (cm):  0.1 Final wound size (cm):  1.4 Hemostasis achieved with:  aluminum chloride Outcome: patient tolerated procedure well with no complications   Post-procedure details: wound care instructions given    mupirocin ointment (BACTROBAN) 2 %  Specimen 1 - Surgical pathology Differential Diagnosis: bcc vs scc  Check Margins: No    I, Vennela Jutte, PA-C, have reviewed all documentation for this visit. The documentation on 03/07/20 for the exam, diagnosis, procedures, and orders are all accurate and complete.Marland Kitchen

## 2020-03-08 ENCOUNTER — Other Ambulatory Visit: Payer: Self-pay | Admitting: Internal Medicine

## 2020-03-08 DIAGNOSIS — Z1231 Encounter for screening mammogram for malignant neoplasm of breast: Secondary | ICD-10-CM

## 2020-03-11 ENCOUNTER — Other Ambulatory Visit: Payer: Self-pay

## 2020-03-11 DIAGNOSIS — G4700 Insomnia, unspecified: Secondary | ICD-10-CM

## 2020-03-11 MED ORDER — TRAZODONE HCL 50 MG PO TABS: 25.0000 mg | ORAL_TABLET | Freq: Every evening | ORAL | 2 refills | Status: AC | PRN

## 2020-03-24 ENCOUNTER — Other Ambulatory Visit: Payer: Self-pay | Admitting: Internal Medicine

## 2020-04-18 ENCOUNTER — Encounter: Payer: Self-pay | Admitting: Internal Medicine

## 2020-04-18 ENCOUNTER — Other Ambulatory Visit: Payer: Self-pay

## 2020-04-18 ENCOUNTER — Ambulatory Visit (INDEPENDENT_AMBULATORY_CARE_PROVIDER_SITE_OTHER): Payer: 59 | Admitting: Internal Medicine

## 2020-04-18 VITALS — BP 119/76 | HR 80 | Temp 97.5°F | Resp 16 | Ht 64.0 in | Wt 245.0 lb

## 2020-04-18 DIAGNOSIS — Z0000 Encounter for general adult medical examination without abnormal findings: Secondary | ICD-10-CM

## 2020-04-18 DIAGNOSIS — E785 Hyperlipidemia, unspecified: Secondary | ICD-10-CM

## 2020-04-18 MED ORDER — ATORVASTATIN CALCIUM 80 MG PO TABS
80.0000 mg | ORAL_TABLET | Freq: Every evening | ORAL | 3 refills | Status: DC
Start: 2020-04-18 — End: 2021-04-19

## 2020-04-18 NOTE — Progress Notes (Signed)
Subjective:    Hannah Beard - 65 y.o. female MRN [id number]  Date of birth: [birth year]  HPI  Hannah Beard is here for annual exam. Has no concerns today.   Depression screen [facility] 2/9 04/18/2020 02/04/2020 03/26/2019  Decreased Interest 0 0 0  Down, Depressed, Hopeless 0 0 1  PHQ - 2 Score 0 0 1  Altered sleeping 0 - 2  Tired, decreased energy 0 - 3  Change in appetite 0 - 1  Feeling bad or failure about yourself  0 - 0  Trouble concentrating 0 - 0  Moving slowly or fidgety/restless 0 - 1  Suicidal thoughts 0 - 0  PHQ-9 Score 0 - 8       Health Maintenance:  There are no preventive care reminders to display for this patient.  -  reports that she quit smoking about 10 years ago. Her smoking use included cigarettes. She quit after 20.00 years of use. She has never used smokeless tobacco. - Review of Systems: Per HPI. - Past Medical History: Patient Active Problem List   Diagnosis Date Noted  . History of choledocholithiasis 02/02/2020  . History of ERCP 02/02/2020  . Calculus of gallbladder without cholecystitis without obstruction 02/02/2020  . Acute superficial gastritis without hemorrhage 02/02/2020  . Hx of adenomatous colonic polyps 02/02/2020  . Abnormal LFTs 10/29/2019  . History of bilious vomiting with nausea 10/29/2019  . Dilated bile duct 10/29/2019  . Abnormal ultrasound of liver 10/29/2019  . Chronic diastolic CHF (congestive heart failure) ([place]) 03/07/2014  . ST elevation myocardial infarction (STEMI) of inferior wall ([place]) 02/22/2014  . Former smoker   . CKD (chronic kidney disease)   . HLD (hyperlipidemia)   . CAD (coronary artery disease)   . Bradycardia 02/21/2014  . Acute kidney injury ([place]) 02/21/2014  . Acute diastolic CHF (congestive heart failure) ([place]) 02/21/2014  . CAD (coronary artery disease), native coronary artery 02/21/2014   - Medications: reviewed and updated   Objective:   Physical Exam BP 119/76 (BP Location: Right Arm, Patient  Position: Sitting, Cuff Size: Large)   Pulse 80   Temp (!) 97.5 F (36.4 C)   Resp 16   Ht 5\' 4"  (1.626 m)   Wt 245 lb (111.1 kg)   LMP  (LMP Unknown)   SpO2 95%   BMI 42.05 kg/m  Physical Exam Constitutional:      Appearance: She is not diaphoretic.  HENT:     Head: Normocephalic and atraumatic.  Eyes:     Conjunctiva/sclera: Conjunctivae normal.     Pupils: Pupils are equal, round, and reactive to light.  Neck:     Thyroid: No thyromegaly.  Cardiovascular:     Rate and Rhythm: Normal rate and regular rhythm.     Heart sounds: Normal heart sounds. No murmur heard.   Pulmonary:     Effort: Pulmonary effort is normal. No respiratory distress.     Breath sounds: Normal breath sounds. No wheezing.  Abdominal:     General: Bowel sounds are normal. There is no distension.     Palpations: Abdomen is soft.     Tenderness: There is no abdominal tenderness. There is no guarding or rebound.  Musculoskeletal:        General: No deformity. Normal range of motion.     Cervical back: Normal range of motion and neck supple.  Lymphadenopathy:     Cervical: No cervical adenopathy.  Skin:    General: Skin is warm and dry.  Findings: No rash.  Neurological:     Mental Status: She is alert and oriented to person, place, and time.     Gait: Gait is intact.  Psychiatric:        Mood and Affect: Mood and affect normal.        Judgment: Judgment normal.            Assessment & Plan:   1. Encounter for annual physical exam Counseled on 150 minutes of exercise per week, healthy eating (including decreased daily intake of saturated fats, cholesterol, added sugars, sodium), STI prevention, routine healthcare maintenance.  2. Hyperlipidemia, unspecified hyperlipidemia type - Lipid panel - atorvastatin (LIPITOR) 80 MG tablet; Take 1 tablet (80 mg total) by mouth every evening.  Dispense: 90 tablet; Refill: [place], D.O. 04/18/2020, 9:44 AM Primary Care at [facility]

## 2020-04-18 NOTE — Progress Notes (Signed)
No concerns. 

## 2020-04-19 ENCOUNTER — Encounter: Payer: 59 | Admitting: Internal Medicine

## 2020-04-21 LAB — LIPID PANEL
Chol/HDL Ratio: 5 ratio — ABNORMAL HIGH (ref 0.0–4.4)
Cholesterol, Total: 226 mg/dL — ABNORMAL HIGH (ref 100–199)
HDL: 45 mg/dL (ref >39)
LDL Chol Calc (NIH): 151 mg/dL — ABNORMAL HIGH (ref 0–99)
Triglycerides: 164 mg/dL — ABNORMAL HIGH (ref 0–149)
VLDL Cholesterol Cal: 30 mg/dL (ref 5–40)

## 2020-04-21 LAB — SPECIMEN STATUS REPORT

## 2020-05-17 ENCOUNTER — Ambulatory Visit: Payer: 59 | Admitting: Physician Assistant

## 2020-05-17 ENCOUNTER — Ambulatory Visit (INDEPENDENT_AMBULATORY_CARE_PROVIDER_SITE_OTHER): Payer: 59 | Admitting: Physician Assistant

## 2020-05-17 ENCOUNTER — Encounter: Payer: Self-pay | Admitting: Physician Assistant

## 2020-05-17 ENCOUNTER — Other Ambulatory Visit: Payer: Self-pay

## 2020-05-17 DIAGNOSIS — Z1283 Encounter for screening for malignant neoplasm of skin: Secondary | ICD-10-CM

## 2020-05-17 DIAGNOSIS — D171 Benign lipomatous neoplasm of skin and subcutaneous tissue of trunk: Secondary | ICD-10-CM

## 2020-05-17 DIAGNOSIS — L219 Seborrheic dermatitis, unspecified: Secondary | ICD-10-CM | POA: Diagnosis not present

## 2020-05-17 DIAGNOSIS — D485 Neoplasm of uncertain behavior of skin: Secondary | ICD-10-CM

## 2020-05-17 DIAGNOSIS — D225 Melanocytic nevi of trunk: Secondary | ICD-10-CM | POA: Diagnosis not present

## 2020-05-17 MED ORDER — KETOCONAZOLE 2 % EX CREA: 1.0000 "application " | TOPICAL_CREAM | Freq: Two times a day (BID) | CUTANEOUS | 3 refills | Status: AC

## 2020-05-17 MED ORDER — ALCLOMETASONE DIPROPIONATE 0.05 % EX CREA: TOPICAL_CREAM | Freq: Two times a day (BID) | CUTANEOUS | 3 refills | Status: AC | PRN

## 2020-05-17 NOTE — Progress Notes (Signed)
   Follow-Up Visit   Subjective  Hannah Beard is a 65 y.o. female who presents for the following: Annual Exam (Patient here for full body skin exam. Concerns left hip tick bite x 10 days ago. It is very red and itchy. No signs of fever, Nausea, Vomiting or headache.   Right abdomen. X 1 year ago. Does get irritated. Also has large cyst on back that has no symptoms and is stable.  Patient has history of Basal Cell carcinoma. Mid forehead burns and itches. She has been using triamcinalone cream and it isn't helping.    The following portions of the chart were reviewed this encounter and updated as appropriate:  Tobacco  Allergies  Meds  Problems  Med Hx  Surg Hx  Fam Hx      Objective  Well appearing patient in no apparent distress; mood and affect are within normal limits.  All skin waist up and legs examined.  Objective  Right Lower Back: Bichromic dark nested macule.      Objective  Left Lower Back: Large soft, immobile mass  Objective  Glabella, Mid Forehead, Right Ear: Erythematous plaques with greasy scale.   Assessment & Plan  Neoplasm of uncertain behavior of skin Right Lower Back  Skin / nail biopsy Type of biopsy: tangential   Informed consent: discussed and consent obtained   Timeout: patient name, date of birth, surgical site, and procedure verified   Anesthesia: the lesion was anesthetized in a standard fashion   Anesthetic:  1% lidocaine w/ epinephrine 1-100,000 local infiltration Instrument used: flexible razor blade   Hemostasis achieved with: ferric subsulfate   Outcome: patient tolerated procedure well   Post-procedure details: wound care instructions given    Specimen 1 - Surgical pathology Differential Diagnosis: scc vs bcc sk  Check Margins: No  Lipoma of torso Left Lower Back  observe  Seborrheic dermatitis (3) Right Ear; Mid Forehead; Glabella  Redness and scale  ketoconazole (NIZORAL) 2 % cream - Glabella, Mid Forehead, Right  Ear  alclomethasone (ACLOVATE) 0.05 % cream - Glabella, Mid Forehead, Right Ear   I, Shainna Faux, PA-C, have reviewed all documentation's for this visit.  The documentation on 05/17/20 for the exam, diagnosis, procedures and orders are all accurate and complete.

## 2020-05-26 ENCOUNTER — Telehealth: Payer: Self-pay

## 2020-05-26 NOTE — Telephone Encounter (Signed)
-----   Message from Warren Danes, [place] sent at 05/25/2020  1:35 PM EDT ----- RTC if recurs

## 2020-05-26 NOTE — Telephone Encounter (Signed)
Phone call to patient with her pathology results. Patient aware of results.  

## 2020-06-01 ENCOUNTER — Ambulatory Visit
Admission: RE | Admit: 2020-06-01 | Discharge: 2020-06-01 | Disposition: A | Discharge status: Home / Self Care | Payer: 59 | Source: Ambulatory Visit | Attending: Internal Medicine | Admitting: Internal Medicine

## 2020-06-01 ENCOUNTER — Other Ambulatory Visit: Payer: Self-pay

## 2020-06-01 DIAGNOSIS — Z1231 Encounter for screening mammogram for malignant neoplasm of breast: Secondary | ICD-10-CM

## 2020-06-01 IMAGING — MG MM DIGITAL SCREENING BILAT W/ TOMO AND CAD
8 series · 8 of 24 positions shown · non-contrast
Comparison: Previous exam(s).

CLINICAL DATA: Screening.

EXAM:
DIGITAL SCREENING BILATERAL MAMMOGRAM WITH TOMOSYNTHESIS AND CAD
TECHNIQUE: Bilateral screening digital craniocaudal and mediolateral oblique
mammograms were obtained. Bilateral screening digital breast
tomosynthesis was performed. The images were evaluated with
computer-aided detection.

[L MLO synth-2D]
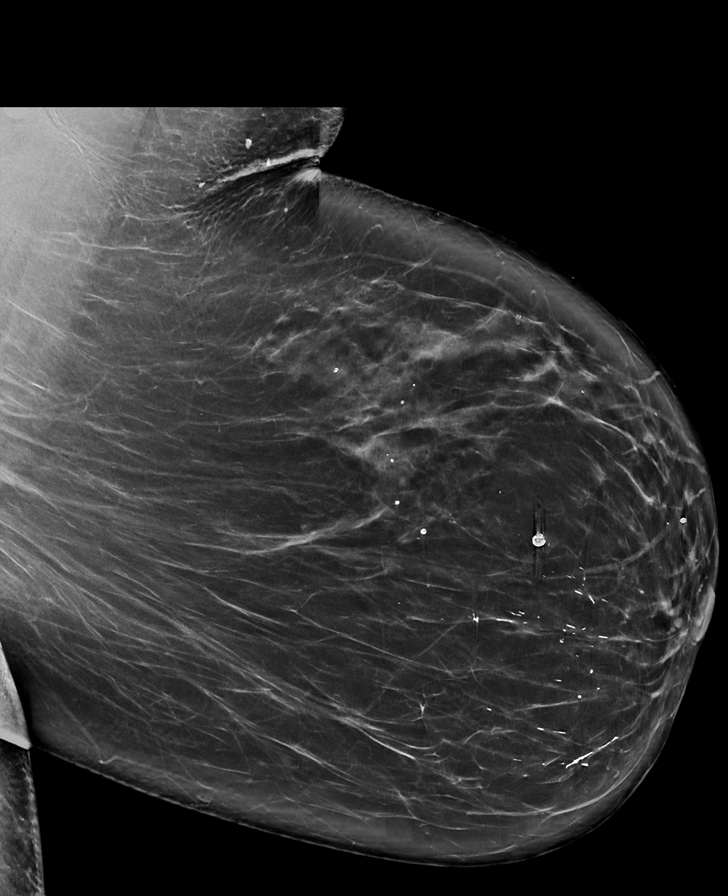

[L CC synth-2D]
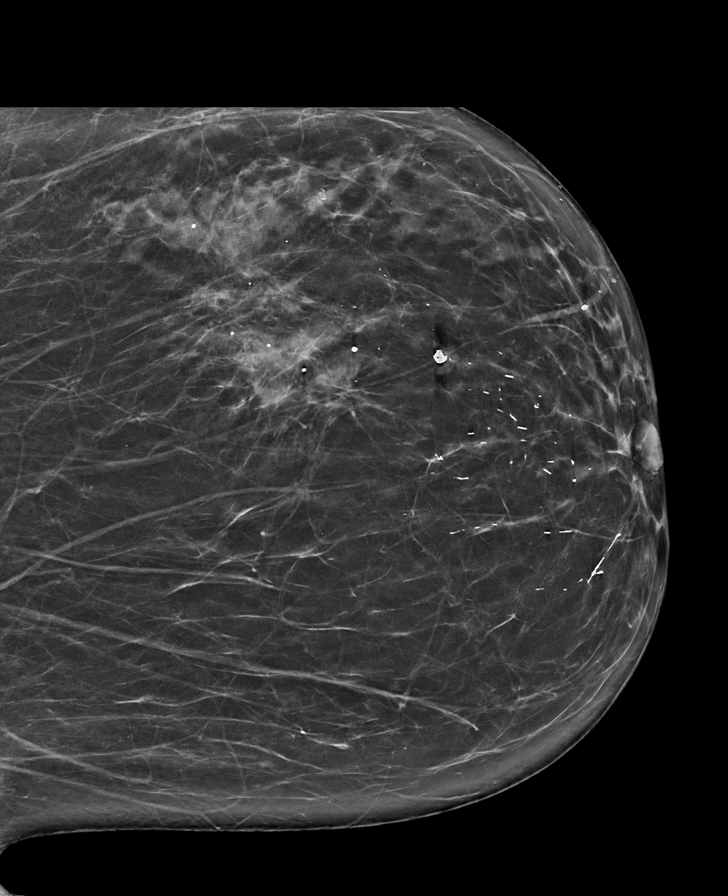

[R MLO synth-2D]
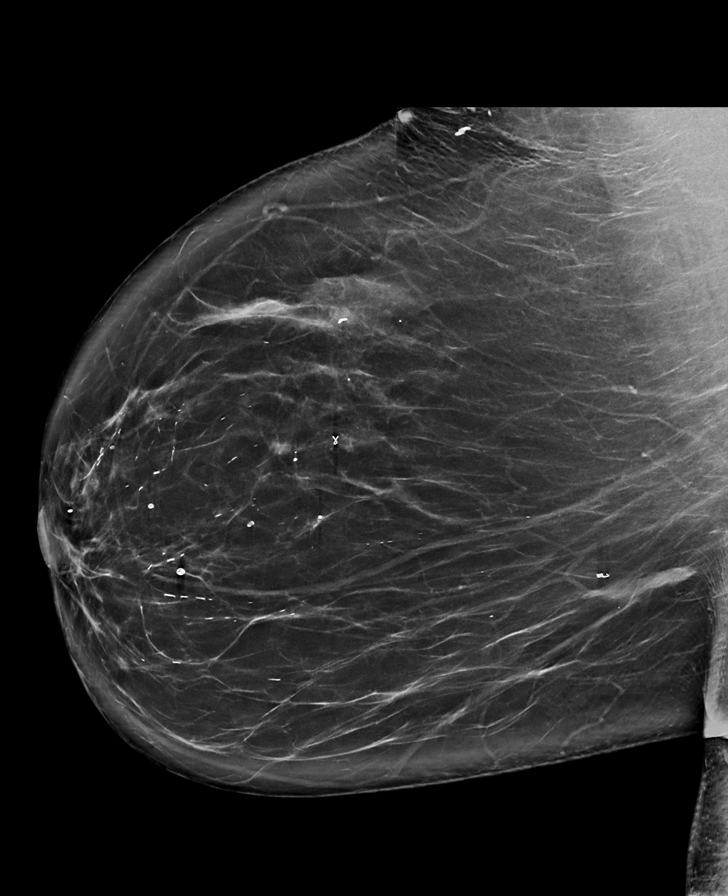

[R CC synth-2D]
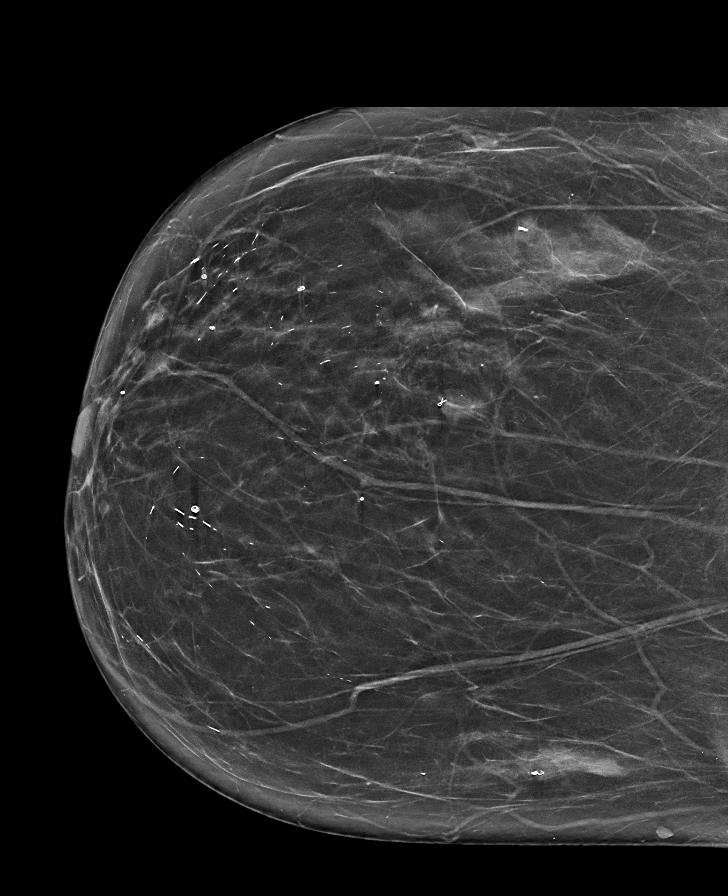

[R CC tomo · tomo slice 36/71.0]
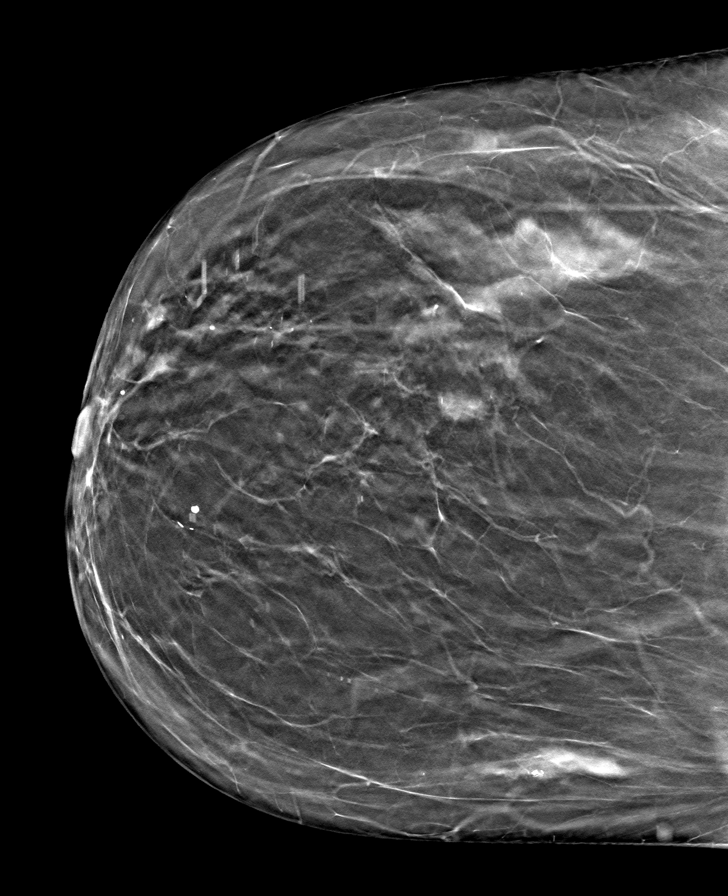

[L CC tomo · tomo slice 39/76.0]
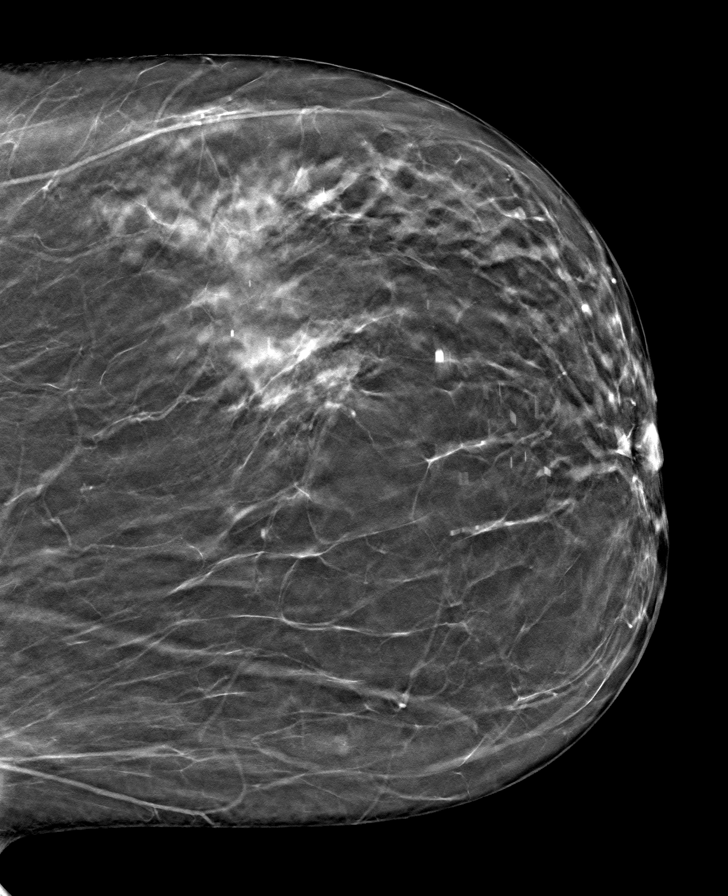

[L MLO tomo · tomo slice 49/97.0]
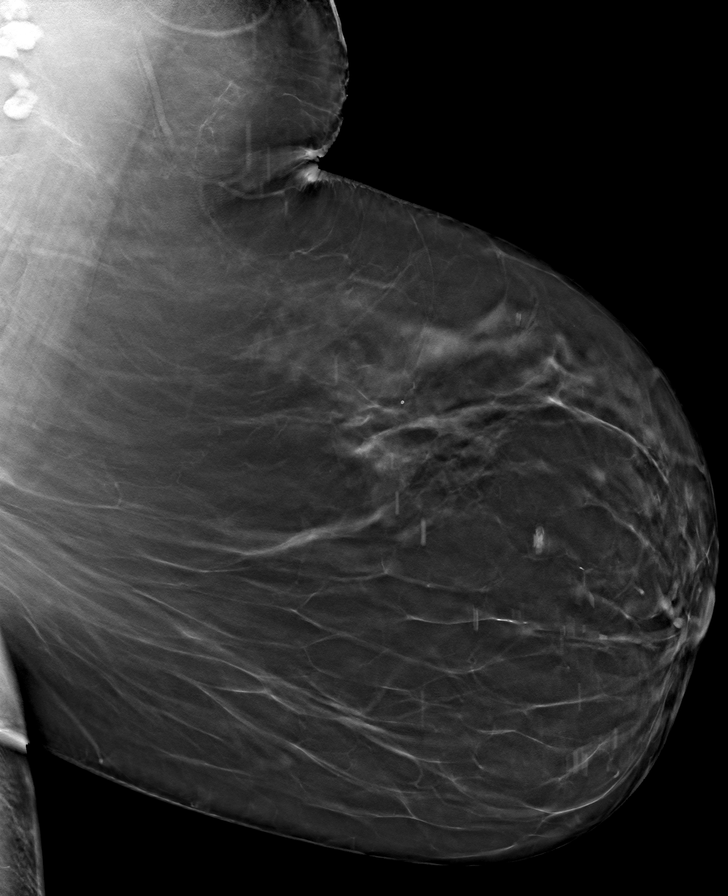

[R MLO tomo · tomo slice 47/94.0]
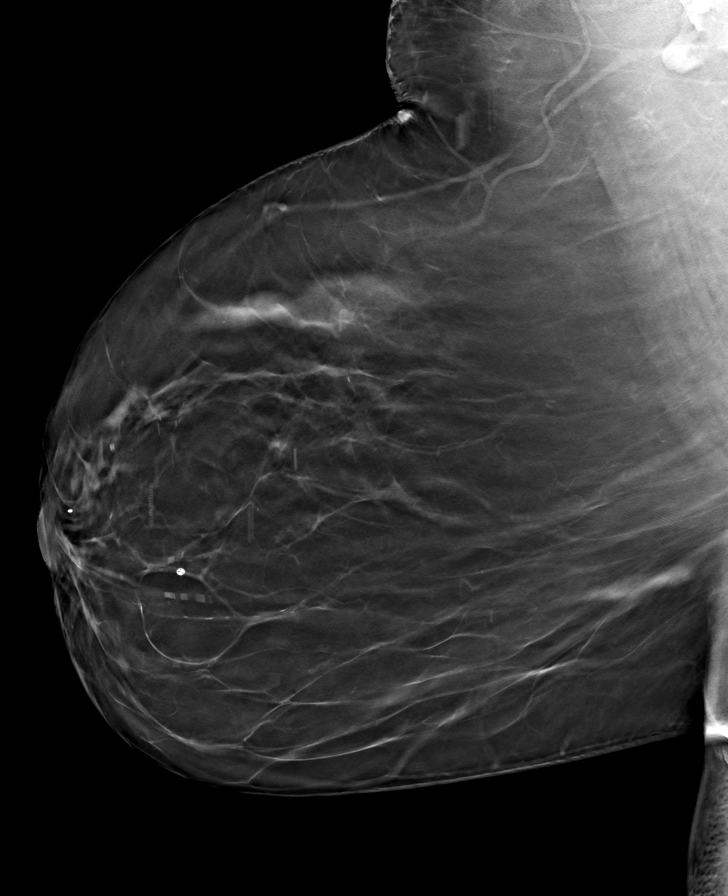

[8 of 24 positions shown; findings below may reference images not displayed]

ACR Breast Density Category b: There are scattered areas of
fibroglandular density.
FINDINGS: In the left breast, possible distortion with an asymmetry warrants
further evaluation. In the right breast, no findings suspicious for
malignancy.
IMPRESSION: Further evaluation is suggested for possible distortion with an
asymmetry in the left breast.

RECOMMENDATION:
Diagnostic mammogram and possibly ultrasound of the left breast.
(Code:[DK])

The patient will be contacted regarding the findings, and additional
imaging will be scheduled.

BI-RADS CATEGORY  0: Incomplete. Need additional imaging evaluation
and/or prior mammograms for comparison.

## 2020-06-02 ENCOUNTER — Other Ambulatory Visit: Payer: Self-pay | Admitting: Internal Medicine

## 2020-06-02 DIAGNOSIS — R928 Other abnormal and inconclusive findings on diagnostic imaging of breast: Secondary | ICD-10-CM

## 2020-06-06 ENCOUNTER — Other Ambulatory Visit (INDEPENDENT_AMBULATORY_CARE_PROVIDER_SITE_OTHER): Payer: 59

## 2020-06-06 DIAGNOSIS — R945 Abnormal results of liver function studies: Secondary | ICD-10-CM

## 2020-06-06 DIAGNOSIS — R7989 Other specified abnormal findings of blood chemistry: Secondary | ICD-10-CM

## 2020-06-06 DIAGNOSIS — K838 Other specified diseases of biliary tract: Secondary | ICD-10-CM | POA: Diagnosis not present

## 2020-06-06 DIAGNOSIS — K8050 Calculus of bile duct without cholangitis or cholecystitis without obstruction: Secondary | ICD-10-CM | POA: Diagnosis not present

## 2020-06-06 DIAGNOSIS — K8020 Calculus of gallbladder without cholecystitis without obstruction: Secondary | ICD-10-CM | POA: Diagnosis not present

## 2020-06-06 LAB — HEPATIC FUNCTION PANEL
ALT: 24 U/L (ref 0–35)
AST: 15 U/L (ref 0–37)
Albumin: 3.9 g/dL (ref 3.5–5.2)
Alkaline Phosphatase: 117 U/L (ref 39–117)
Bilirubin, Direct: 0 mg/dL (ref 0.0–0.3)
Total Bilirubin: 0.3 mg/dL (ref 0.2–1.2)
Total Protein: 7.5 g/dL (ref 6.0–8.3)

## 2020-06-15 ENCOUNTER — Ambulatory Visit (INDEPENDENT_AMBULATORY_CARE_PROVIDER_SITE_OTHER): Payer: 59 | Admitting: Gastroenterology

## 2020-06-15 ENCOUNTER — Encounter: Payer: Self-pay | Admitting: Gastroenterology

## 2020-06-15 VITALS — BP 126/68 | HR 70 | Ht 63.5 in | Wt 254.0 lb

## 2020-06-15 DIAGNOSIS — Z9049 Acquired absence of other specified parts of digestive tract: Secondary | ICD-10-CM

## 2020-06-15 DIAGNOSIS — K8050 Calculus of bile duct without cholangitis or cholecystitis without obstruction: Secondary | ICD-10-CM | POA: Diagnosis not present

## 2020-06-15 DIAGNOSIS — R932 Abnormal findings on diagnostic imaging of liver and biliary tract: Secondary | ICD-10-CM

## 2020-06-15 DIAGNOSIS — Z9889 Other specified postprocedural states: Secondary | ICD-10-CM

## 2020-06-15 NOTE — Progress Notes (Signed)
[place] VISIT   Primary Care Provider Nicolette Bang, DO [place] [address] [phone]  Patient Profile: Jalexus Brett is a 65 y.o. female with a pmh significant for iCM with prior PCI intervention, hypertension, hyperlipidemia, prediabetes, bladder cancer (currently undergoing BCG treatments), cholelithiasis and prior choledocholithiasis (now status postcholecystectomy (prior ERCP before cholecystectomy) although IOC was slightly positive as per surgery MD and radiology report), previous gastritis.  The patient presents to the [facility] for an evaluation and management of problem(s) noted below:  Problem List 1. History of cholecystectomy   2. Abnormal cholangiogram   3. History of ERCP   4. History of choledocholithiasis     History of Present Illness Please see initial consultation note and prior progress notes for full details of HPI.    Interval History The patient returns for scheduled follow-up.  Patient has undergone her cholecystectomy however time of her cholecystectomy she had a positive IOC.  She was eventually discharged since she was doing well and since there was some drainage of flow after glucagon administration during time of biopsy.  Patient has normalized her liver biochemical tests.  Patient denies any other significant changes at this time.  She has completed her therapy for gastritis and is feeling well.  She remains on no PPI therapy.  GI Review of Systems Positive as above Negative for pain, nausea, vomiting, early satiety early satiety, change in bowel habits, melena, hematochezia   Review of Systems General: Denies fevers/chills/weight loss unintentionally Cardiovascular: Denies chest pain/palpitations Pulmonary: Denies shortness of breath Gastroenterological: See HPI Genitourinary: Denies darkened urine Hematological: Denies easy bruising/bleeding Dermatological: Denies  jaundice  Psychological: Mood is stable   Medications Current Outpatient Medications  Medication Sig Dispense Refill  . acetaminophen (TYLENOL) 650 MG CR tablet Take 1,300 mg by mouth every 8 (eight) hours as needed for pain.    Marland Kitchen alclomethasone (ACLOVATE) 0.05 % cream Apply topically 2 (two) times daily as needed (Rash). 180 g 3  . ascorbic acid (VITAMIN C) 500 MG tablet Take 500 mg by mouth 2 (two) times daily.    Marland Kitchen atorvastatin (LIPITOR) 80 MG tablet Take 1 tablet (80 mg total) by mouth every evening. 90 tablet 3  . Cholecalciferol (VITAMIN D3) 25 MCG (1000 UT) CAPS Take 1,000 Units by mouth daily.     Marland Kitchen HYDROcodone-acetaminophen (NORCO/VICODIN) 5-325 MG tablet Take 1 tablet by mouth every 6 (six) hours as needed for moderate pain. 10 tablet 0  . ibuprofen (ADVIL) 800 MG tablet Take 1 tablet (800 mg total) by mouth every 8 (eight) hours as needed. 30 tablet 0  . ketoconazole (NIZORAL) 2 % cream Apply 1 application topically 2 (two) times daily. 60 g 3  . mupirocin ointment (BACTROBAN) 2 % Apply 1 application topically 2 (two) times daily. 22 g 2  . traZODone (DESYREL) 50 MG tablet Take 0.5 tablets (25 mg total) by mouth at bedtime as needed for sleep. 15 tablet 2  . triamcinolone cream (KENALOG) 0.1 % Apply 1 application topically 2 (two) times daily. 453.6 g 0  . amLODipine (NORVASC) 10 MG tablet Take 1 tablet (10 mg total) by mouth every evening. 90 tablet 1   No current facility-administered medications for this visit.    Allergies No Known Allergies  Histories Past Medical History:  Diagnosis Date  . (HFpEF) heart failure with preserved ejection fraction ([place]) 02/2014   volume excess in setting of STEMI and volume resuscitation (EF preserved) post cardiac cath;  last echo 02-20-2014  ef 55-60%. G2DD  . Arthritis    back L3-S1  . Bilateral cataracts    MD just watching  . Bladder cancer ([place])   . CAD (coronary artery disease) previous cardiologist Dr Burt Knack, lov note in epic  04-28-2014  (03-13-2019  per pt has not seen any doctor in approx. 5 yrs ago, but denies any cardiac s&s)   a. 02/19/14 inf STEMI s/p DES to LCx and moderate stenosis midRCA  . Eczema   . History of pleural empyema    left side  08-30-2009 s/p  left VATS w/ drainage/ decortication  . History of ST elevation myocardial infarction (STEMI) 02/19/2014  . HLD (hyperlipidemia)   . Hypertension    03-13-2019  was on medication 5 yrs ago but none since this time due to not having seen any doctor for 5 yrs due to insurance issue's  . Pneumonia    x  1  . S/P drug eluting coronary stent placement    02-20-2004   PCI and DES x1 to LCx  . Skin cancer of arm   . Wears glasses    Past Surgical History:  Procedure Laterality Date  . BILIARY DILATION  12/14/2019   Procedure: BILIARY DILATION;  Surgeon: Rush Landmark Telford Nab., MD;  Location: [facility];  Service: Gastroenterology;;  . BIOPSY  12/14/2019   Procedure: BIOPSY;  Surgeon: [name]., MD;  Location: [place];  Service: Gastroenterology;;  . BREAST BIOPSY Right 06/01/2019   MILD FIBROCYSTIC CHANGE. FIBROADENOMA  . CARDIAC CATHETERIZATION  02/19/2014   left  . CHOLECYSTECTOMY N/A 03/03/2020   Procedure: LAPAROSCOPIC CHOLECYSTECTOMY WITH INTRAOPERATIVE CHOLANGIOGRAM;  Surgeon: Kieth Brightly Arta Bruce, MD;  Location: [facility];  Service: General;  Laterality: N/A;  . CYSTOSCOPY WITH BIOPSY N/A 04/29/2019   Procedure: CYSTOSCOPY WITH BLADDER BIOPSY/ RIGHT STENT REMOVAL;  Surgeon: Ceasar Mons, MD;  Location: [facility];  Service: Urology;  Laterality: N/A;  . DILATION AND CURETTAGE OF UTERUS  yrs ago  . ENDOSCOPIC RETROGRADE CHOLANGIOPANCREATOGRAPHY (ERCP) WITH PROPOFOL N/A 12/14/2019   Procedure: ENDOSCOPIC RETROGRADE CHOLANGIOPANCREATOGRAPHY (ERCP) WITH PROPOFOL;  Surgeon: Rush Landmark Telford Nab., MD;  Location: [place];  Service: Gastroenterology;  Laterality: N/A;  . ESOPHAGOGASTRODUODENOSCOPY  (EGD) WITH PROPOFOL N/A 12/14/2019   Procedure: ESOPHAGOGASTRODUODENOSCOPY (EGD) WITH PROPOFOL;  Surgeon: Rush Landmark Telford Nab., MD;  Location: [place];  Service: Gastroenterology;  Laterality: N/A;  . LEFT HEART CATH N/A 02/19/2014   Procedure: LEFT HEART CATH;  Surgeon: Blane Ohara, MD;  Location: [facility];  Service: Cardiovascular;  Laterality: N/A;  . REMOVAL OF STONES  12/14/2019   Procedure: REMOVAL OF STONES;  Surgeon: Rush Landmark Telford Nab., MD;  Location: [place];  Service: Gastroenterology;;  . skin cancer removal      02/2020 - left upper arm   . SPHINCTEROTOMY  12/14/2019   Procedure: SPHINCTEROTOMY;  Surgeon: Mansouraty, Telford Nab., MD;  Location: [place];  Service: Gastroenterology;;  . TRANSURETHRAL RESECTION OF BLADDER TUMOR N/A 03/18/2019   Procedure: TRANSURETHRAL RESECTION OF BLADDER TUMOR (TURBT) WITH CYSTOSCOPY RIGHT STENT PLACEMENT/ INTRAVESICAL INSTILLATION OF GEMCITABINE;  Surgeon: Ceasar Mons, MD;  Location[facility];  Service: Urology;  Laterality: N/A;  . TRANSURETHRAL RESECTION OF BLADDER TUMOR N/A 09/23/2019   Procedure: Cystoscopy with TURBT (small) and resection of right ureteral orifice, Right JJ stent placement; Right retrograde pyelogram with intraoperative interpretation of fluoroscopic imaging post operative instillation of gemcitabine;  Surgeon: Ceasar Mons, MD;  Location: [facility];  Service: Urology;  Laterality: N/A;  . TUBAL LIGATION Bilateral yrs ago  . VIDEO ASSISTED THORACOSCOPY (VATS)/EMPYEMA Left 08-30-2009   dr [email or web address]  @[facility]    Social History   Socioeconomic History  . Marital status: Single    Spouse name: Not on file  . Number of children: Not on file  . Years of education: Not on file  . Highest education level: Not on file  Occupational History  . Not on file  Tobacco Use  . Smoking status: Former Smoker    Years: 20.00    Types: Cigarettes    Quit  date: 08/28/2009    Years since quitting: 10.8  . Smokeless tobacco: Never Used  Vaping Use  . Vaping Use: Never used  Substance and Sexual Activity  . Alcohol use: Yes    Alcohol/week: 0.0 standard drinks    Comment: rare  . Drug use: Never  . Sexual activity: Not on file  Other Topics Concern  . Not on file  Social History Narrative   ** Merged History Encounter **       Social Determinants of Health   Financial Resource Strain: Not on file  Food Insecurity: Not on file  Transportation Needs: Not on file  Physical Activity: Not on file  Stress: Not on file  Social Connections: Not on file  Intimate Partner Violence: Not on file   Family History  Problem Relation Age of Onset  . Cancer Mother   . Diabetes Mother   . Heart disease Mother   . Hyperlipidemia Mother   . Hypertension Mother   . Heart disease Maternal Grandmother   . Heart failure Maternal Grandmother   . Heart disease Maternal Grandfather   . Diabetes Maternal Grandfather   . Heart attack Maternal Grandfather   . Cirrhosis Paternal Grandmother   . Diabetes Brother   . Heart disease Brother   . Hyperlipidemia Brother   . Hypertension Brother   . Diabetes Brother   . Hyperlipidemia Brother   . Hypertension Brother   . Stroke Neg Hx   . Colon cancer Neg Hx   . Pancreatic cancer Neg Hx   . Stomach cancer Neg Hx   . Esophageal cancer Neg Hx   . Inflammatory bowel disease Neg Hx   . Liver disease Neg Hx   . Rectal cancer Neg Hx    I have reviewed her medical, social, and family history in detail and updated the electronic medical record as necessary.    PHYSICAL EXAMINATION  BP 126/68   Pulse 70   Ht 5' 3.5" (1.613 m)   Wt 254 lb (115.2 kg)   LMP  (LMP Unknown)   BMI 44.29 kg/m  Wt Readings from Last 3 Encounters:  06/15/20 254 lb (115.2 kg)  04/18/20 245 lb (111.1 kg)  03/03/20 250 lb (113.4 kg)  GEN: NAD, appears stated age, doesn't appear chronically ill PSYCH: Cooperative, without  pressured speech EYE: Conjunctivae pink, sclerae anicteric ENT: Masked CV: Nontachycardic RESP: No audible wheezing GI: NABS, soft, protuberant, surgical scars present but are well-healed, rounded, nontender, without rebound  MSK/EXT: Trace bilateral lower extremity edema SKIN: No jaundice, no spider angiomata NEURO:  Alert & Oriented x 3, no focal deficits   REVIEW OF DATA  I reviewed the following data at the time of this encounter:  GI Procedures and Studies  No new studies to review  Laboratory Studies  Reviewed those in epic and care everywhere  Imaging Studies  February 2022  IOC IMPRESSION: 1. Suspected distal CBD partially obstructing choledocholithiasis.  Operative Results  February 2022 cholecystectomy A periumbilical incision was made and optical entry was used to enter the abdomen. 2 5 mm trocars were placed on in the right lateral space on in the right subcostal space. A 28mm trocar was placed in the subxiphoid space. Marcaine was infused to the subxiphoid space and lateral upper right abdomen in the transversus abdominis plane. Next the patient was placed in reverse trendelenberg. The gallbladder appeareddilated and chronically inflamed. Omentum was adhered to the gallbladder and was taken down with cautery/blunt dissection. The gallbladder was retracted cephalad and lateral. The peritoneum was reflected off the infundibulum working lateral to medial. The cystic duct and cystic artery were identified and further dissection revealed a critical view, due to concern for choledocholithiasis a cholangiogram was performed with ductotomy and cook catheter passed through a separate subcostal stab incision. Initial view showed dilated ducts and no drainage into the duodenum. Glucagon was then given. On repeat view there was drainage into the dudoenum with question of sediment at the sphincter. The cystic duct and cystic artery were doubly clipped and ligated.  The gallbladder was  removed off the liver bed with cautery. The Gallbladder was placed in a specimen bag. The gallbladder fossa was irrigated and hemostasis was applied with cautery. The gallbladder was removed via the 22mm trocar. No dilation was required for removal, therefore no fascial closure was performed. Pneumoperitoneum was removed, all trocar were removed. All incisions were closed with 4-0 monocryl subcuticular stitch. The patient woke from anesthesia and was brought to PACU in stable condition. All counts were correct Findings: chronically inflamed gallbladder   ASSESSMENT  Ms. Germain is a 65 y.o. female with a pmh significant for iCM with prior PCI intervention, hypertension, hyperlipidemia, prediabetes, bladder cancer (currently undergoing BCG treatments), cholelithiasis and prior choledocholithiasis (now status postcholecystectomy (prior ERCP before cholecystectomy) although IOC was slightly positive as per surgery MD and radiology report), previous gastritis.  The patient is seen today for evaluation and management of:  1. History of cholecystectomy   2. Abnormal cholangiogram   3. History of ERCP   4. History of choledocholithiasis    The patient is clinically and hemodynamically stable at this time.  Patient's LFTs have normalized at this time that which is good news.  However, she had a positive IOC and had a significant amount of stone burden when I had performed her procedure.  It is certainly possible that additional stones could be present even in the setting of her normal LFTs as she had quite a few/significant mount of stone burden even with improving LFTs after her 1 episode of nausea/vomiting.  I offered the patient repeat diagnostic ERCP with balloon sweep versus MRI/MRCP which would be less invasive.  Patient would like to proceed with an MRI/MRCP to ensure that no retained stones are present after her positive IOC.  I think this is a reasonable conservative measure.  We will obtain this  MRI/MRCP this year and if it is abnormal then proceed with ERCP.  She has stopped her PPI at this time and she is feeling well.  All patient questions were answered to the best of my ability, and the patient agrees to the aforementioned plan of action with follow-up as indicated.   PLAN  MRI/MRCP to be performed later this year to evaluate for any retained choledocholithiasis -If positive will need ERCP Repeat colonoscopy in 2024 for colon polyp surveillance    Orders Placed  This Encounter  Procedures  . MR ABDOMEN MRCP W WO CONTAST    New Prescriptions   No medications on file   Modified Medications   Modified Medication Previous Medication   AMLODIPINE (NORVASC) 10 MG TABLET amLODipine (NORVASC) 10 MG tablet      Take 1 tablet (10 mg total) by mouth every evening.    Take 1 tablet (10 mg total) by mouth daily.    Planned Follow Up Return in about 1 year (around 06/15/2021).   Total Time in Face-to-Face and in Coordination of Care for patient including independent/personal interpretation/review of prior testing, medical history, examination, medication adjustment, communicating results with the patient directly, and documentation with the EHR is 25 minutes.   Justice Britain, MD [facility] Office # [phone]

## 2020-06-15 NOTE — Patient Instructions (Addendum)
You have been scheduled for an MRI at [facility] on 09/21/20. Your appointment time is 10:00am. Please arrive 15 minutes prior to your appointment time for registration purposes. Please make certain not to have anything to eat or drink 6 hours prior to your test. In addition, if you have any metal in your body, have a pacemaker or defibrillator, please be sure to let your ordering physician know. This test typically takes 45 minutes to 1 hour to complete. Should you need to reschedule, please call [phone] to do so.  If you are age 64 or older, your body mass index should be between 23-30. Your Body mass index is 44.29 kg/m. If this is out of the aforementioned range listed, please consider follow up with your Primary Care Provider.  If you are age 78 or younger, your body mass index should be between 19-25. Your Body mass index is 44.29 kg/m. If this is out of the aformentioned range listed, please consider follow up with your Primary Care Provider.   __________________________________________________________  The [facility] providers would like to encourage you to use [facility] to communicate with providers for non-urgent requests or questions.  Due to long hold times on the telephone, sending your provider a message by [facility] may be a faster and more efficient way to get a response.  Please allow 48 business hours for a response.  Please remember that this is for non-urgent requests.    You will need a follow-up in 1 year- office will call with an appointment.   Thank you for choosing me and [facility].  Dr. Rush Landmark

## 2020-06-16 ENCOUNTER — Other Ambulatory Visit: Payer: Self-pay | Admitting: Internal Medicine

## 2020-06-16 DIAGNOSIS — I10 Essential (primary) hypertension: Secondary | ICD-10-CM

## 2020-06-17 ENCOUNTER — Encounter: Payer: Self-pay | Admitting: Gastroenterology

## 2020-06-21 DIAGNOSIS — R932 Abnormal findings on diagnostic imaging of liver and biliary tract: Secondary | ICD-10-CM | POA: Insufficient documentation

## 2020-06-21 DIAGNOSIS — Z9049 Acquired absence of other specified parts of digestive tract: Secondary | ICD-10-CM | POA: Insufficient documentation

## 2020-06-24 ENCOUNTER — Ambulatory Visit
Admission: RE | Admit: 2020-06-24 | Discharge: 2020-06-24 | Disposition: A | Discharge status: Home / Self Care | Payer: 59 | Source: Ambulatory Visit | Attending: Internal Medicine | Admitting: Internal Medicine

## 2020-06-24 ENCOUNTER — Other Ambulatory Visit: Payer: Self-pay

## 2020-06-24 DIAGNOSIS — R928 Other abnormal and inconclusive findings on diagnostic imaging of breast: Secondary | ICD-10-CM

## 2020-06-24 IMAGING — US US BREAST*L* LIMITED INC AXILLA
1 series · 4 of 4 positions shown · non-contrast
Comparison: Previous exam(s).

CLINICAL DATA: Recall from screening to evaluate left breast
asymmetry with possible distortion.

EXAM:
DIGITAL DIAGNOSTIC UNILATERAL LEFT MAMMOGRAM WITH TOMOSYNTHESIS AND
CAD; ULTRASOUND LEFT BREAST LIMITED
TECHNIQUE: Left digital diagnostic mammography and breast tomosynthesis was
performed. The images were evaluated with computer-aided detection.;
Targeted ultrasound examination of the left breast was performed

[Series 1: us breast*left* limited inc axilla · 0.09mm/px · 4 of 4 slices shown]
[im 1/4]
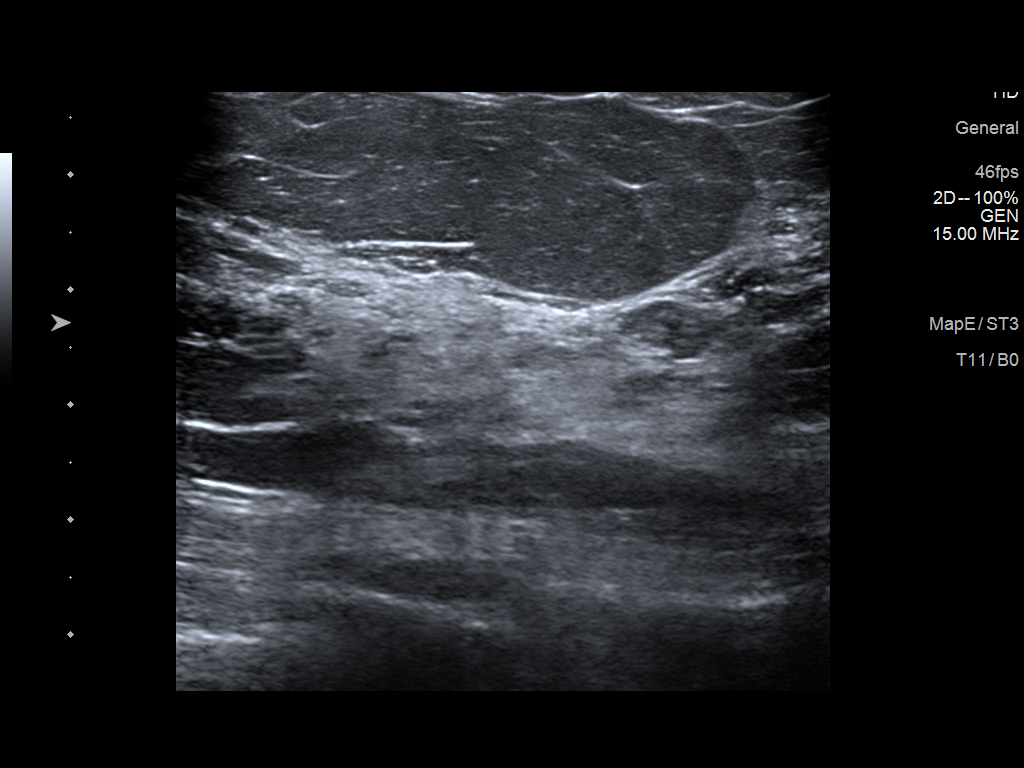
[im 2/4]
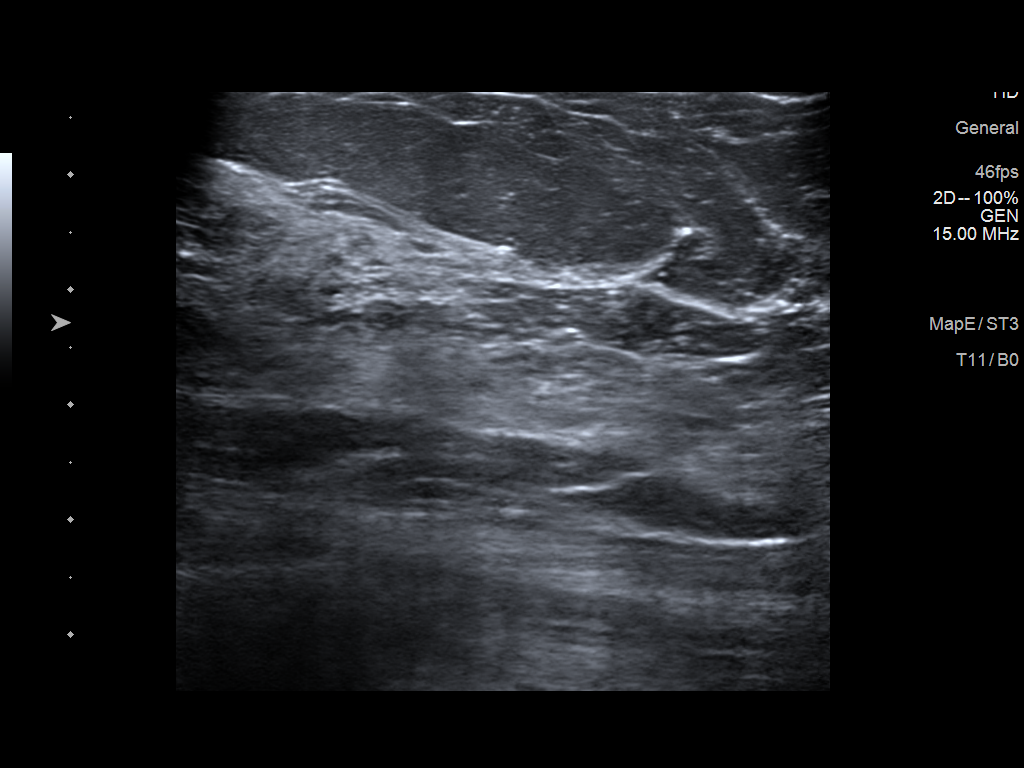
[im 3/4]
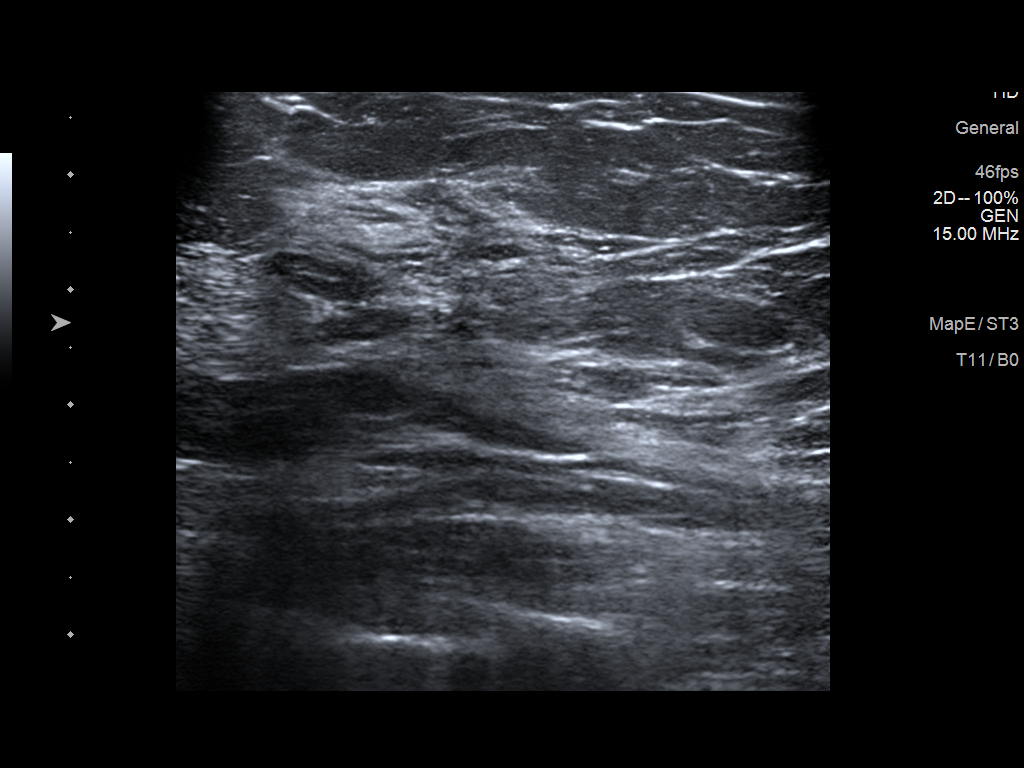
[im 4/4]
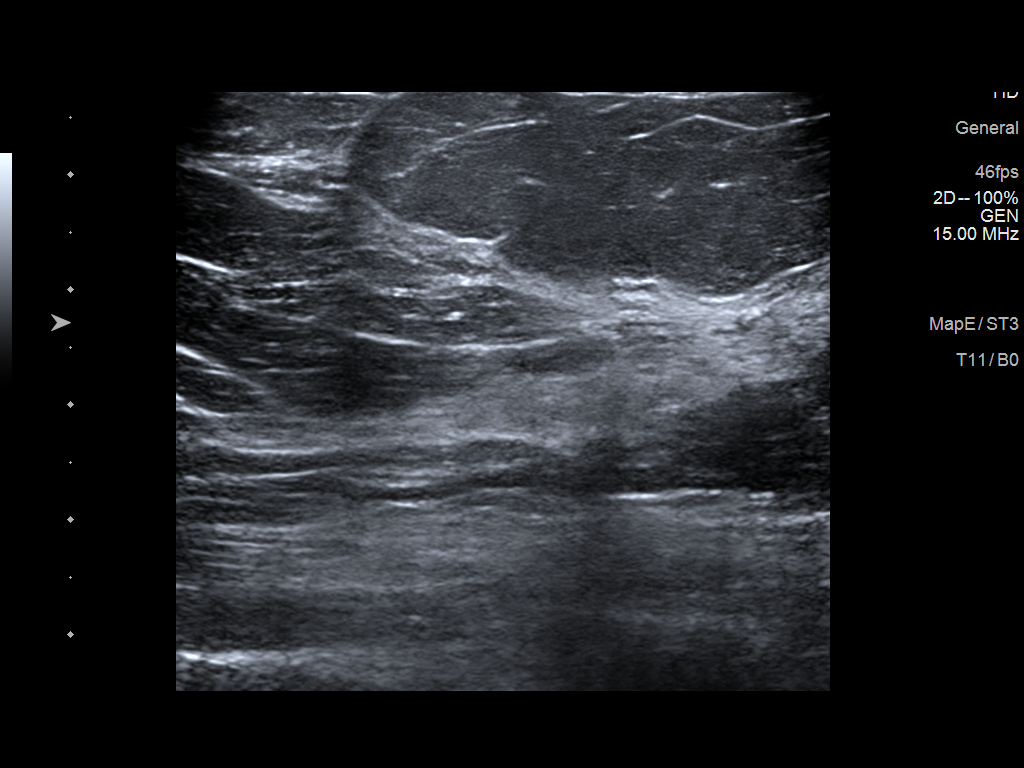

[4 of 4 positions shown; findings below may reference images not displayed]

ACR Breast Density Category b: There are scattered areas of
fibroglandular density.
FINDINGS: Additional spot compression and true lateral tomographic images were
obtained. Examination demonstrates asymmetric density over the upper
outer left breast in the middle third without significant change on
today's images compared to the prior exam. No focal distortion in
this region. Remainder of the left breast is unchanged.

Targeted ultrasound is performed, showing no focal abnormality over
the upper outer quadrant of the left breast.
IMPRESSION: Stable asymmetric fibroglandular tissue over the upper-outer left
breast without focal abnormality.

RECOMMENDATION:
Recommend continued annual bilateral screening mammographic
follow-up.

I have discussed the findings and recommendations with the patient.
If applicable, a reminder letter will be sent to the patient
regarding the next appointment.

BI-RADS CATEGORY  2: Benign.

## 2020-06-24 IMAGING — MG MM DIGITAL DIAGNOSTIC UNILAT*L* W/ TOMO W/ CAD
8 series · 8 of 24 positions shown · non-contrast
Comparison: Previous exam(s).

CLINICAL DATA: Recall from screening to evaluate left breast
asymmetry with possible distortion.

EXAM:
DIGITAL DIAGNOSTIC UNILATERAL LEFT MAMMOGRAM WITH TOMOSYNTHESIS AND
CAD; ULTRASOUND LEFT BREAST LIMITED
TECHNIQUE: Left digital diagnostic mammography and breast tomosynthesis was
performed. The images were evaluated with computer-aided detection.;
Targeted ultrasound examination of the left breast was performed

[L ML synth-2D]
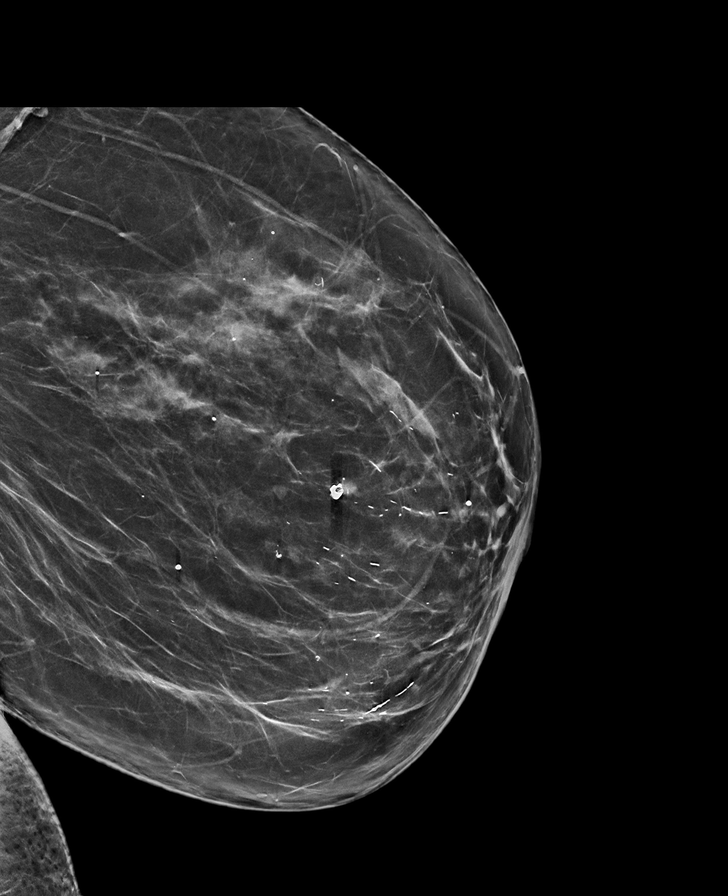

[L MLO synth-2D]
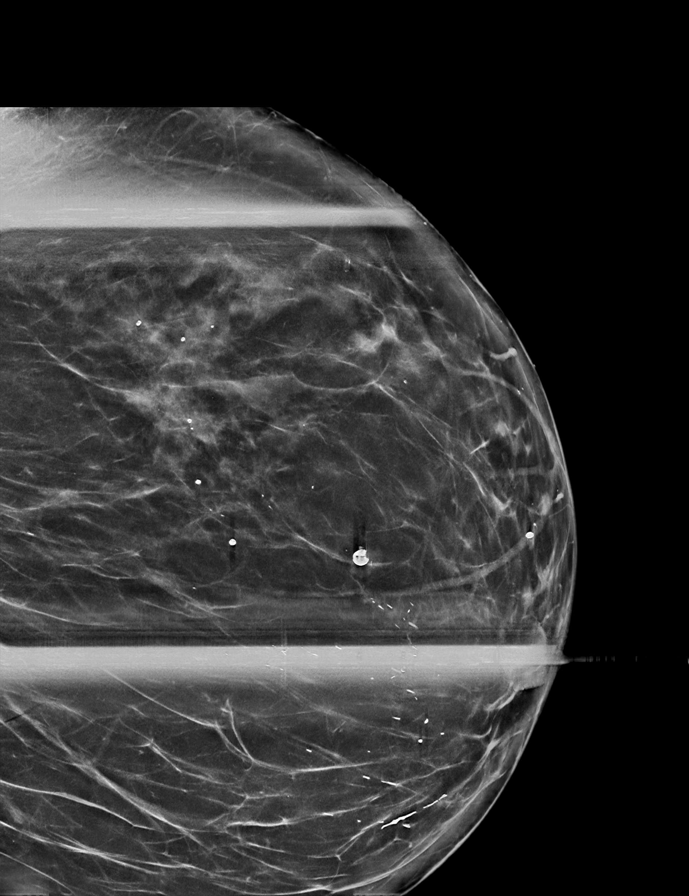

[L CC synth-2D (1 of 2)]
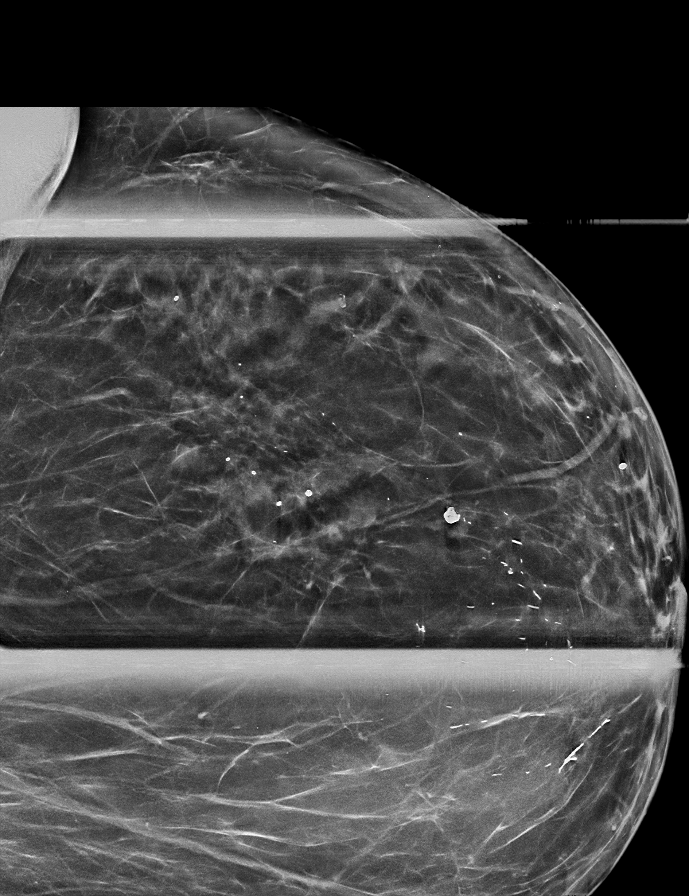

[L CC synth-2D (2 of 2)]
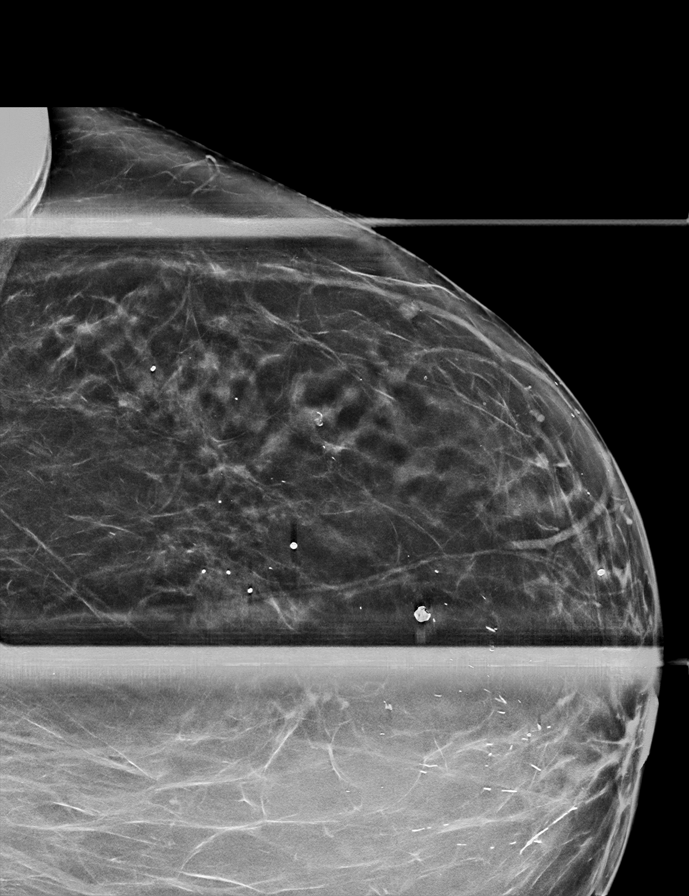

[L CC tomo (1 of 2) · tomo slice 39/76.0]
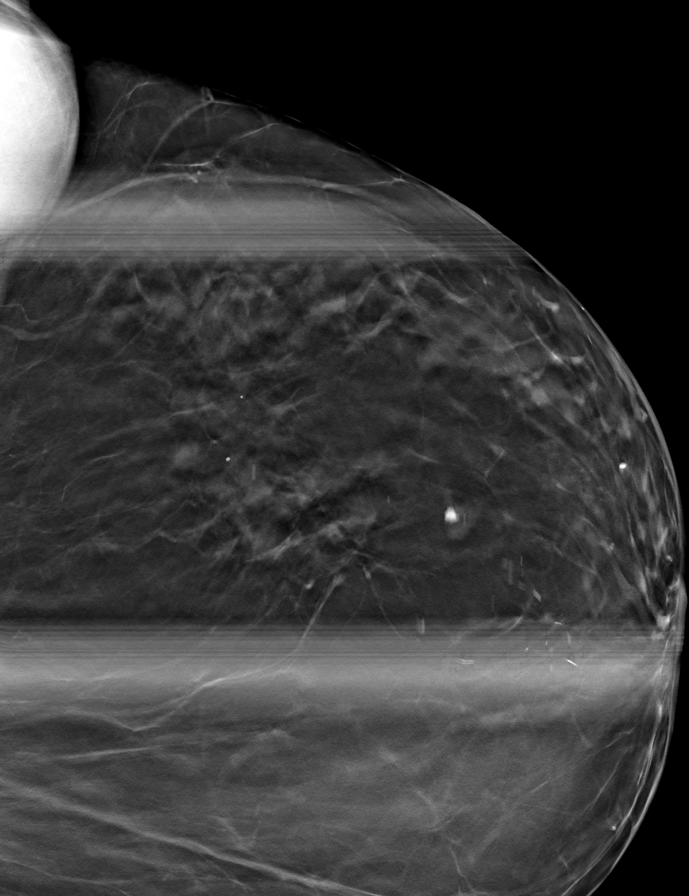

[L MLO tomo · tomo slice 43/86.0]
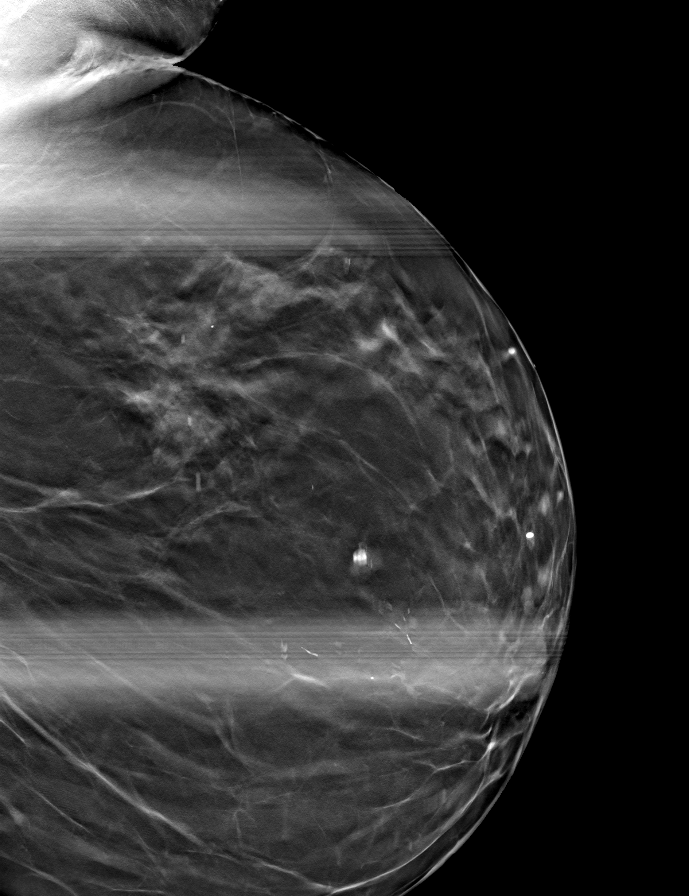

[L ML tomo · tomo slice 42/83.0]
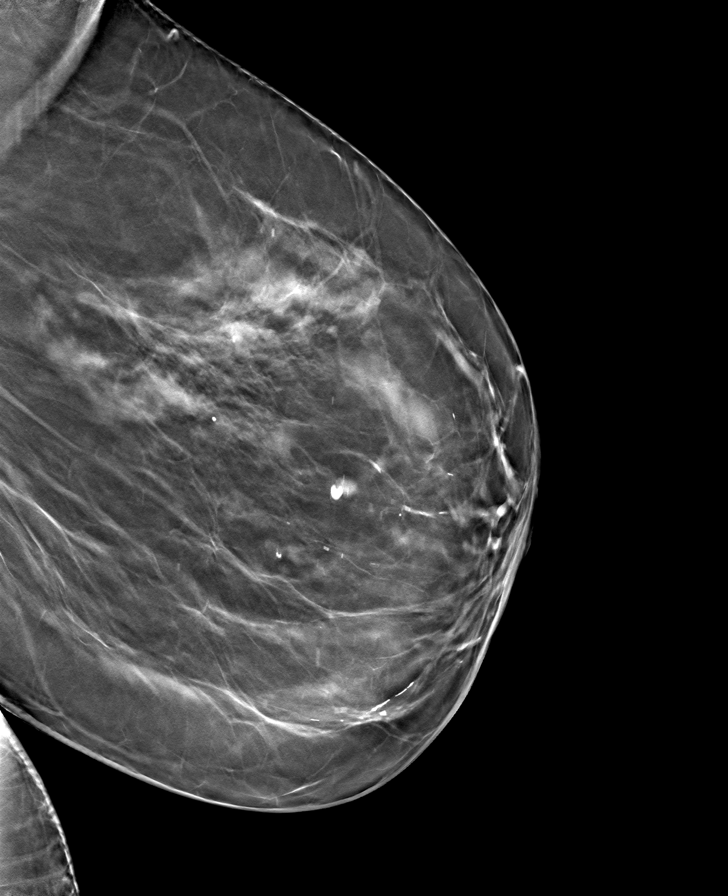

[L CC tomo (2 of 2) · tomo slice 37/73.0]
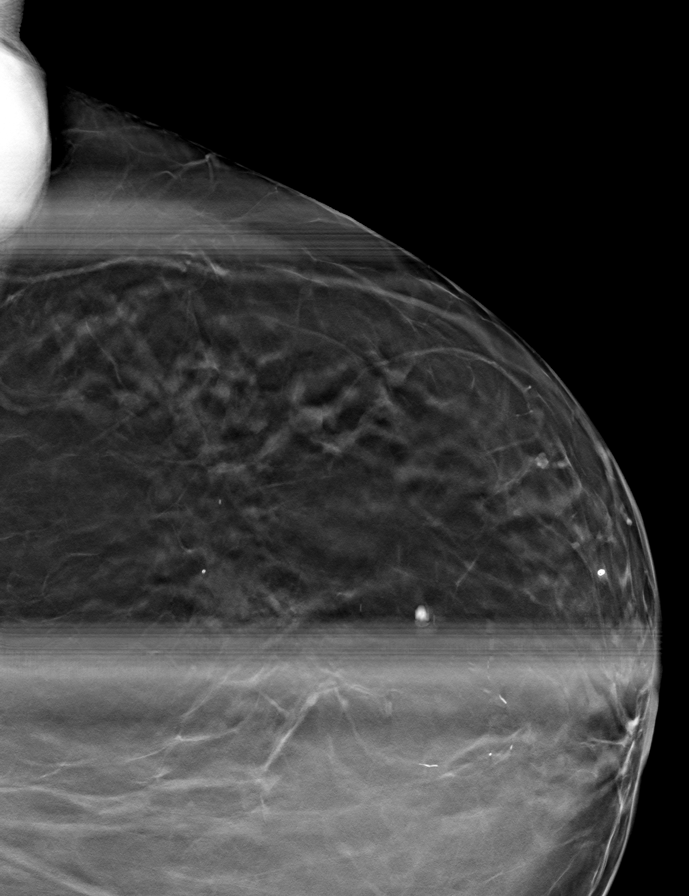

[8 of 24 positions shown; findings below may reference images not displayed]

ACR Breast Density Category b: There are scattered areas of
fibroglandular density.
FINDINGS: Additional spot compression and true lateral tomographic images were
obtained. Examination demonstrates asymmetric density over the upper
outer left breast in the middle third without significant change on
today's images compared to the prior exam. No focal distortion in
this region. Remainder of the left breast is unchanged.

Targeted ultrasound is performed, showing no focal abnormality over
the upper outer quadrant of the left breast.
IMPRESSION: Stable asymmetric fibroglandular tissue over the upper-outer left
breast without focal abnormality.

RECOMMENDATION:
Recommend continued annual bilateral screening mammographic
follow-up.

I have discussed the findings and recommendations with the patient.
If applicable, a reminder letter will be sent to the patient
regarding the next appointment.

BI-RADS CATEGORY  2: Benign.

## 2020-09-21 ENCOUNTER — Ambulatory Visit (HOSPITAL_COMMUNITY): Payer: 59

## 2020-09-28 ENCOUNTER — Ambulatory Visit (HOSPITAL_COMMUNITY)
Admission: RE | Admit: 2020-09-28 | Discharge: 2020-09-28 | Disposition: A | Discharge status: Home / Self Care | Payer: 59 | Source: Ambulatory Visit | Attending: Gastroenterology | Admitting: Gastroenterology

## 2020-09-28 ENCOUNTER — Other Ambulatory Visit: Payer: Self-pay | Admitting: Gastroenterology

## 2020-09-28 ENCOUNTER — Other Ambulatory Visit: Payer: Self-pay

## 2020-09-28 DIAGNOSIS — K8050 Calculus of bile duct without cholangitis or cholecystitis without obstruction: Secondary | ICD-10-CM | POA: Diagnosis present

## 2020-09-28 DIAGNOSIS — R932 Abnormal findings on diagnostic imaging of liver and biliary tract: Secondary | ICD-10-CM

## 2020-09-28 DIAGNOSIS — Z9889 Other specified postprocedural states: Secondary | ICD-10-CM

## 2020-09-28 DIAGNOSIS — Z9049 Acquired absence of other specified parts of digestive tract: Secondary | ICD-10-CM

## 2020-09-28 IMAGING — MR MR ABDOMEN WO/W CM MRCP
17 of 20 series · 39 of 48 positions shown · IV contrast (gadavist)
Comparison: [DATE]

CLINICAL DATA: History of biliary sphincterotomy and
choledocholithiasis/cholecystectomy. Abdominal pain.

EXAM:
MRI ABDOMEN WITHOUT AND WITH CONTRAST (INCLUDING MRCP)
TECHNIQUE: Multiplanar multisequence MR imaging of the abdomen was performed
both before and after the administration of intravenous contrast.
Heavily T2-weighted images of the biliary and pancreatic ducts were
obtained, and three-dimensional MRCP images were rendered by post
processing.
CONTRAST:  10mL GADAVIST GADOBUTROL 1 MMOL/ML IV SOLN initial
contrast extravasated into the left wrist and the patient was
evaluated. A second bolus of 10 cc of contrast was then
administered.

[Series 2: DWI · axial · 6.0mm · 1.72mm/px · z∈[-36,+216]mm · 2 of 72 slices shown (1 of 2)]
[im 1/72]
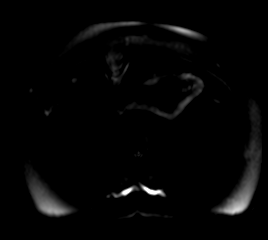
[im 72/72]
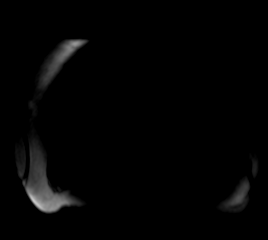

[Series 3: DWI · axial · 6.0mm · 1.72mm/px · 1 of 36 slices shown (2 of 2)]
[im 1/36]
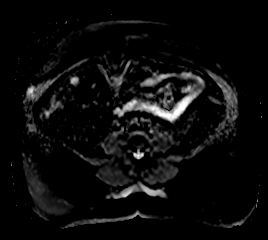

[Series 4: T2 fat-sat · axial · 6.0mm · 1.41mm/px · 1 of 36 slices shown]
[im 1/36]
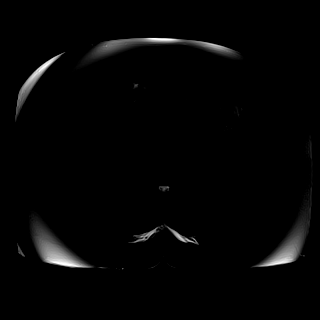

[Series 8: T2 · coronal · 6.0mm · 1.68mm/px · 1 of 36 slices shown (1 of 2)]
[im 1/36]
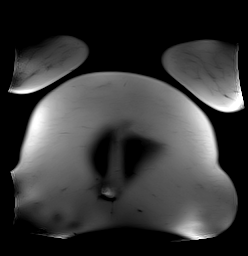

[Series 10: T1 · axial · 3.0mm · 1.38mm/px · z∈[-48,+189]mm · 3 of 80 slices shown (1 of 2)]
[im 1/80]
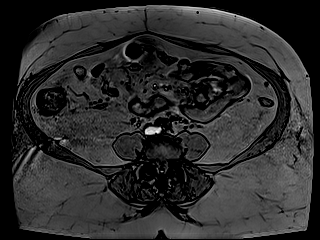
[im 40/80]
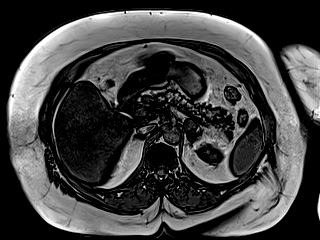
[im 80/80]
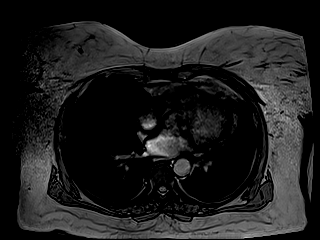

[Series 11: T1 · axial · 3.0mm · 1.38mm/px · z∈[-48,+189]mm · 3 of 80 slices shown (2 of 2)]
[im 1/80]
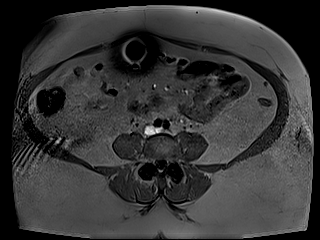
[im 40/80]
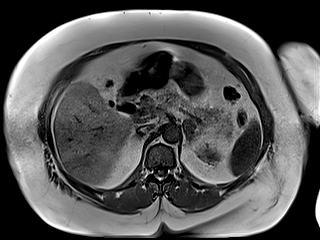
[im 80/80]
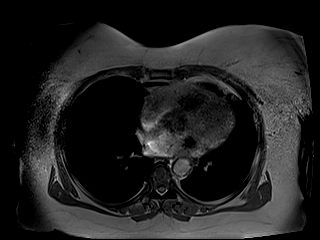

[Series 12: cor obl thk · sagittal · 50.0mm · 0.78mm/px · 1 of 8 slices shown]
[im 1/8]
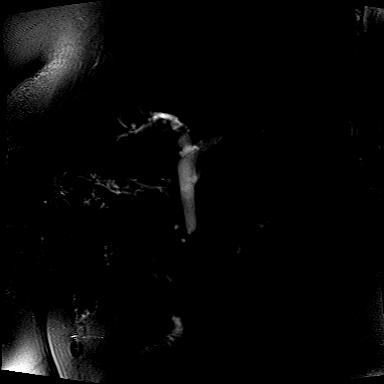

[Series 14: cor_3d_spc_trig · coronal · 1.0mm · 0.59mm/px · 3 of 72 slices shown]
[im 1/72]
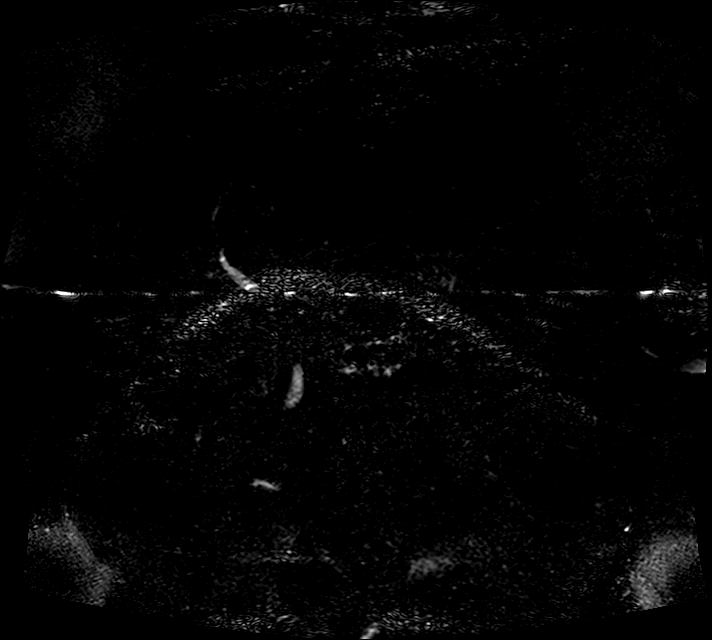
[im 36/72]
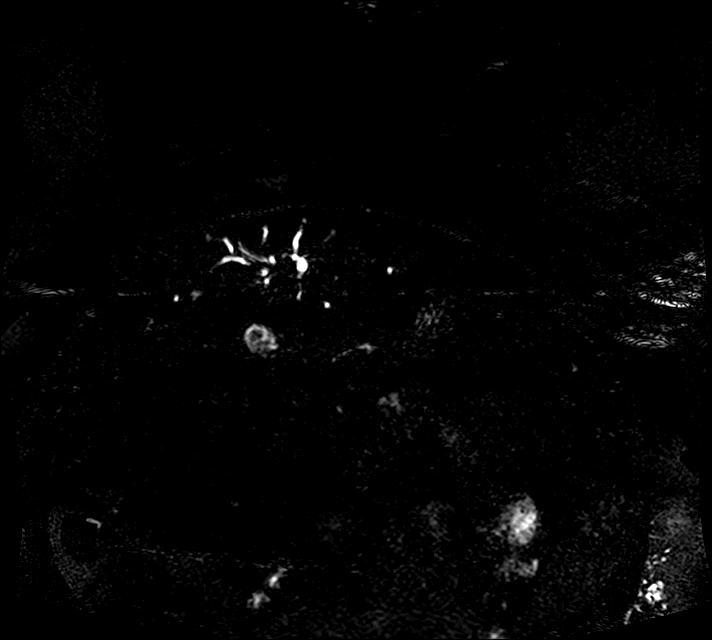
[im 72/72]
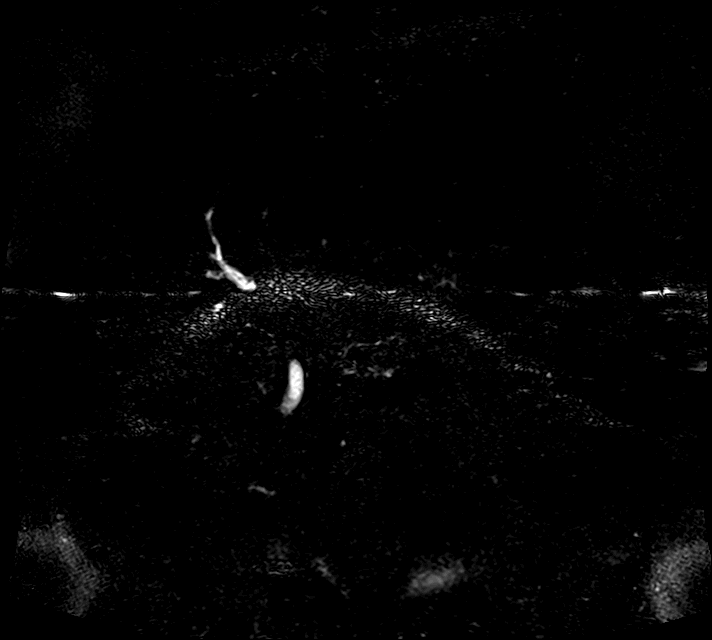

[Series 16: T2 · axial · 6.0mm · 1.64mm/px · 1 of 36 slices shown (2 of 2)]
[im 1/36]
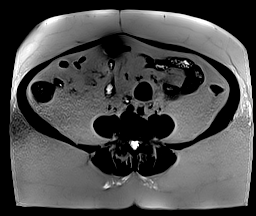

[Series 40: T1 dynamic · axial · 3.0mm · 1.25mm/px · z∈[-26,+211]mm · 3 of 80 slices shown (1 of 6)]
[im 1/80]
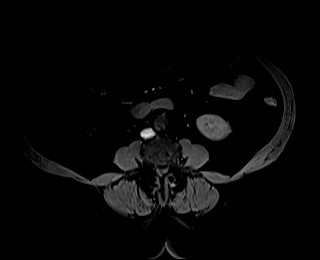
[im 40/80]
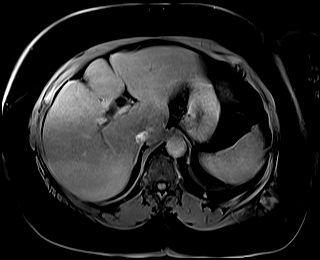
[im 80/80]
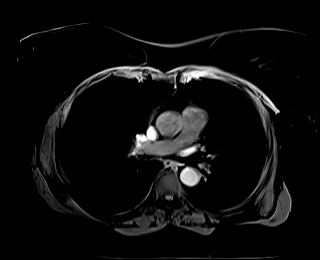

[Series 43: T1 dynamic · axial · 3.0mm · 1.25mm/px · z∈[-26,+211]mm · 3 of 80 slices shown (2 of 6)]
[im 1/80]
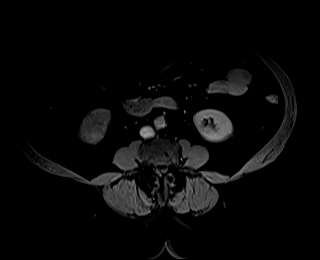
[im 40/80]
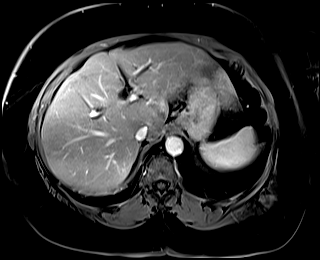
[im 80/80]
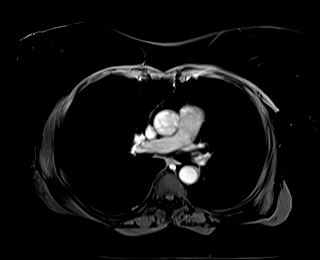

[Series 45: T1 dynamic · axial · 3.0mm · 1.25mm/px · z∈[-26,+211]mm · 3 of 80 slices shown (3 of 6)]
[im 1/80]
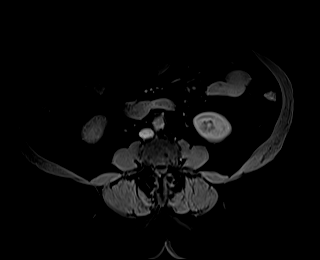
[im 40/80]
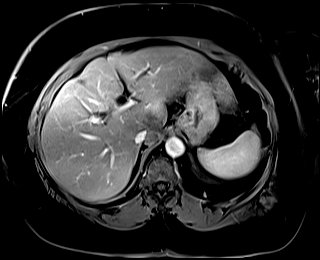
[im 80/80]
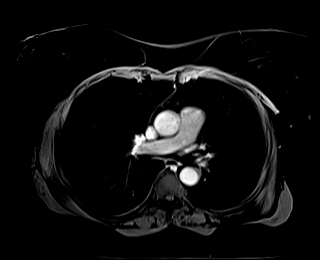

[Series 47: T1 dynamic · axial · 3.0mm · 1.25mm/px · z∈[-26,+211]mm · 3 of 80 slices shown (4 of 6)]
[im 1/80]
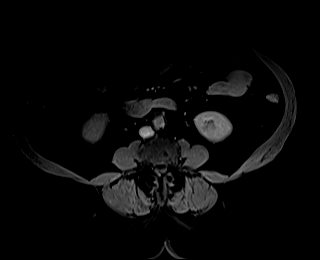
[im 40/80]
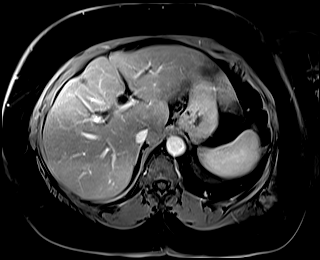
[im 80/80]
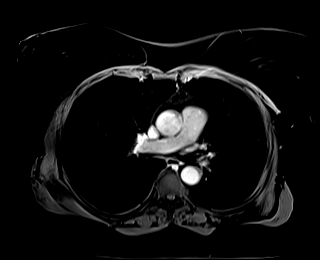

[Series 49: T1 dynamic · coronal · 3.0mm · 0.87mm/px · 3 of 80 slices shown (5 of 6)]
[im 1/80]
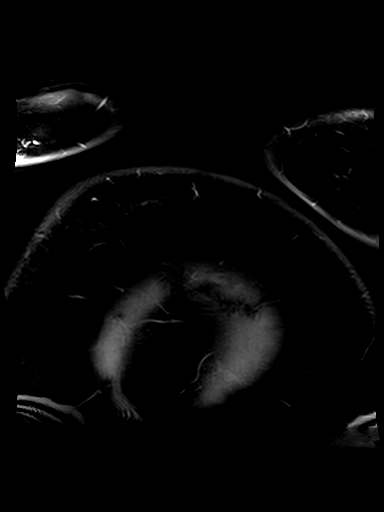
[im 40/80]
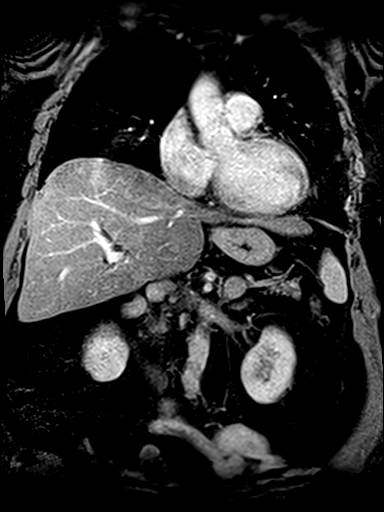
[im 80/80]
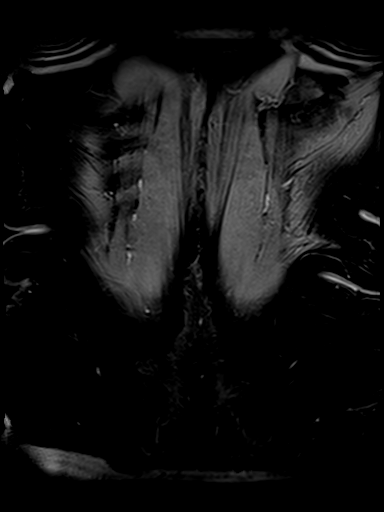

[Series 51: T1 dynamic · axial · 3.0mm · 1.25mm/px · z∈[-26,+211]mm · 3 of 80 slices shown (6 of 6)]
[im 1/80]
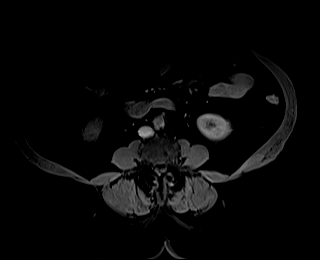
[im 40/80]
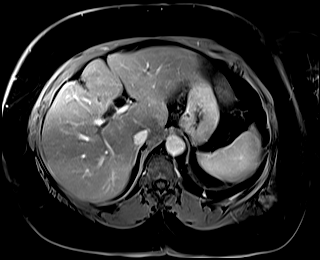
[im 80/80]
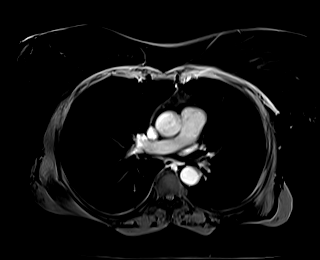

[Series 102: 20 sec sub · axial · 3.0mm · 1.25mm/px · z∈[-26,+211]mm · 3 of 80 slices shown]
[im 1/80]
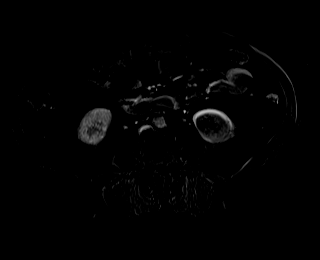
[im 40/80]
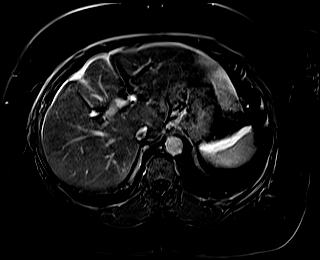
[im 80/80]
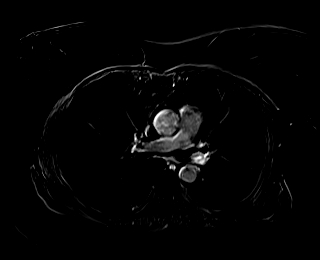

[Series 103: sub_45 sec · axial · 3.0mm · 1.25mm/px · z∈[-26,+91]mm · 2 of 80 slices shown]
[im 1/80]
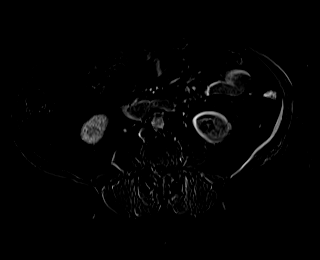
[im 40/80]
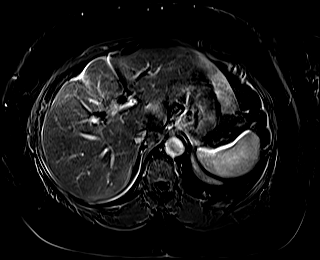

[39 of 48 positions shown; findings below may reference images not displayed]

FINDINGS: Lower chest: Mild cardiomegaly, without pericardial or pleural
effusion.

Hepatobiliary: Moderate to marked hepatic steatosis. No focal liver
lesion. Interval cholecystectomy. Intrahepatic biliary duct
dilatation has decreased. For example, left hepatic duct measures 9
mm on [DATE] versus 17 mm at the same level on the prior exam (when
remeasured).

Common duct measures 12 mm on [DATE] versus 19 mm on the prior exam
(when remeasured). No residual choledocholithiasis.

Pancreas:  Normal, without mass or ductal dilatation.

Spleen:  Normal in size, without focal abnormality.

Adrenals/Urinary Tract: Normal right adrenal gland. Left adrenal
nodule of up to 2.2 cm is similar to on the prior exam, consistent
with an adenoma. Normal kidneys, without hydronephrosis.

Stomach/Bowel: Normal stomach and abdominal bowel loops.

Vascular/Lymphatic: Advanced aortic and branch vessel
atherosclerosis. No retroperitoneal or retrocrural adenopathy.

Other:  No ascites.

Musculoskeletal: No acute osseous abnormality.
IMPRESSION: 1. Interval cholecystectomy with improvement in biliary duct
dilatation. No residual choledocholithiasis identified.
2.  No acute process or explanation for right-sided pain.
3. Left adrenal adenoma.
4.  Aortic Atherosclerosis ([8U]-[8U]).
5. Hepatic steatosis

## 2020-09-28 IMAGING — MR MR 3D RECON AT SCANNER
4 series · 48 of 48 positions shown · IV contrast (10 GADAVIST)
Comparison: [DATE]

CLINICAL DATA: History of biliary sphincterotomy and
choledocholithiasis/cholecystectomy. Abdominal pain.

EXAM:
MRI ABDOMEN WITHOUT AND WITH CONTRAST (INCLUDING MRCP)
TECHNIQUE: Multiplanar multisequence MR imaging of the abdomen was performed
both before and after the administration of intravenous contrast.
Heavily T2-weighted images of the biliary and pancreatic ducts were
obtained, and three-dimensional MRCP images were rendered by post
processing.
CONTRAST:  10mL GADAVIST GADOBUTROL 1 MMOL/ML IV SOLN initial
contrast extravasated into the left wrist and the patient was
evaluated. A second bolus of 10 cc of contrast was then
administered.

[Series 102: 20 sec sub · axial · 3.0mm · 1.25mm/px · z∈[-26,+211]mm · 12 of 80 slices shown]
[im 1/80]
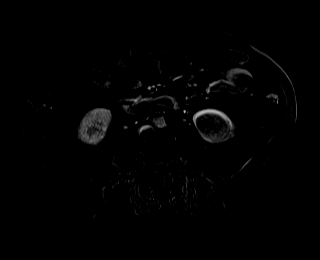
[im 8/80]
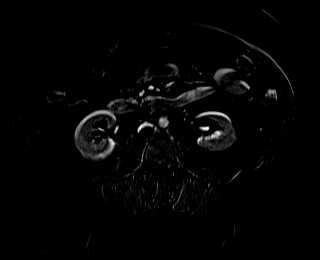
[im 15/80]
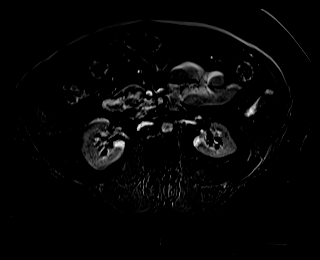
[im 22/80]
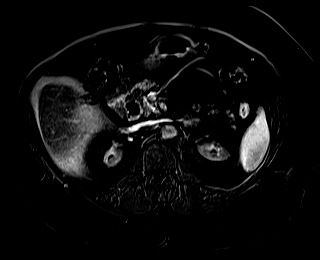
[im 29/80]
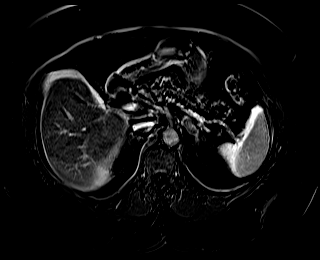
[im 36/80]
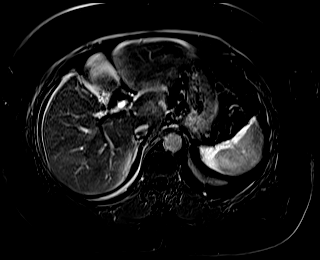
[im 44/80]
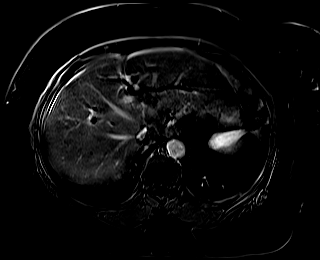
[im 51/80]
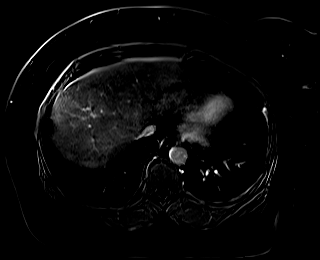
[im 58/80]
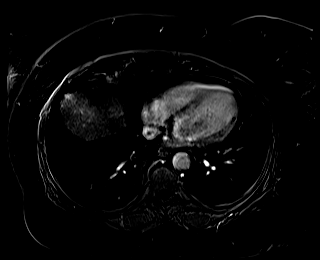
[im 65/80]
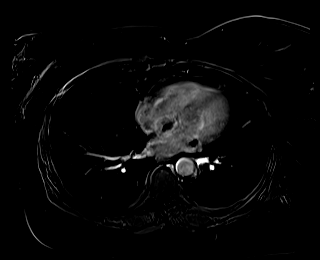
[im 72/80]
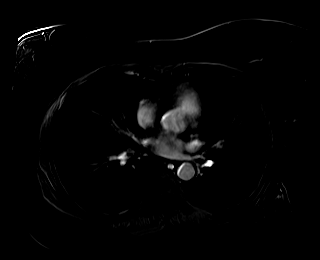
[im 80/80]
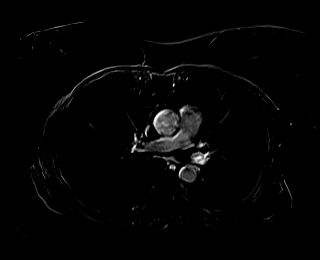

[Series 103: sub_45 sec · axial · 3.0mm · 1.25mm/px · z∈[-26,+211]mm · 12 of 80 slices shown]
[im 1/80]
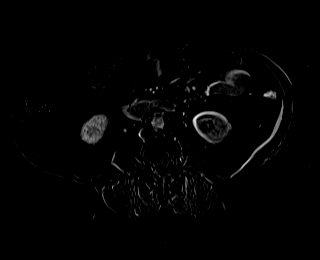
[im 8/80]
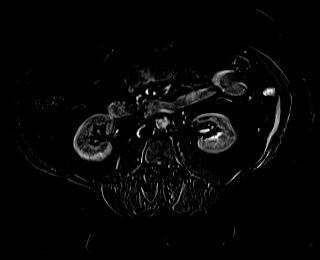
[im 15/80]
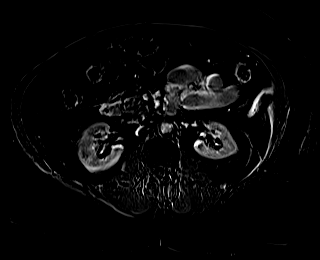
[im 22/80]
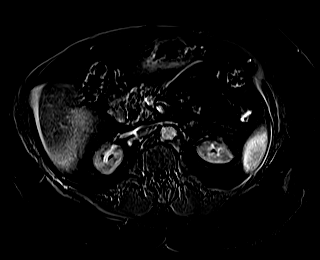
[im 29/80]
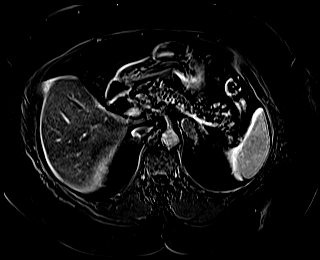
[im 36/80]
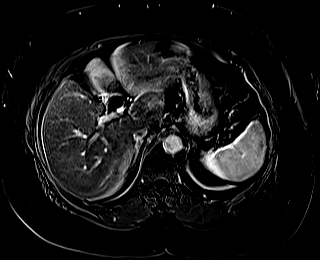
[im 44/80]
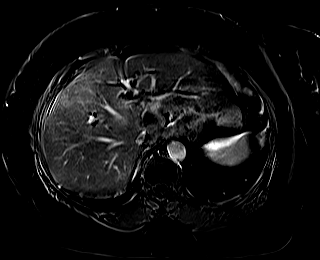
[im 51/80]
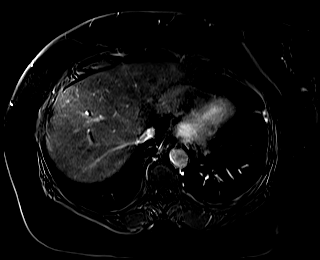
[im 58/80]
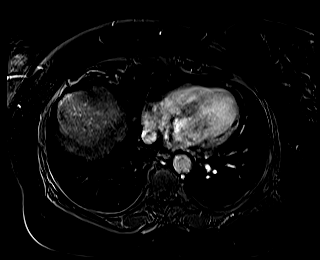
[im 65/80]
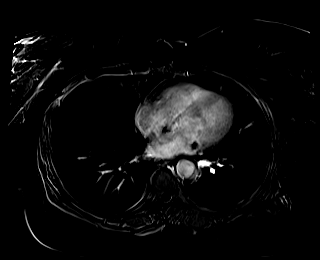
[im 72/80]
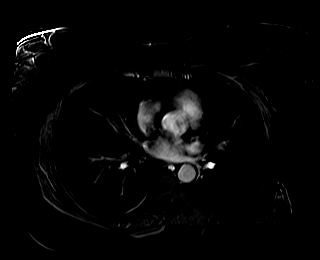
[im 80/80]
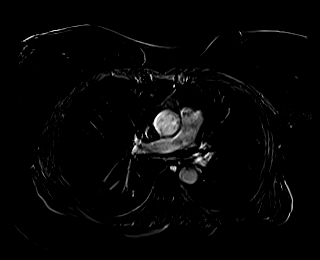

[Series 104: sub_90 sec · axial · 3.0mm · 1.25mm/px · z∈[-26,+211]mm · 12 of 80 slices shown]
[im 1/80]
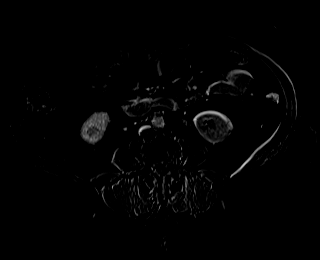
[im 8/80]
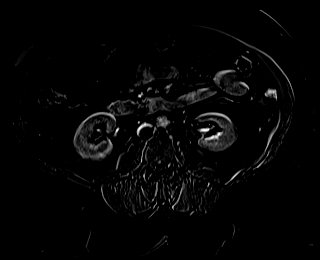
[im 15/80]
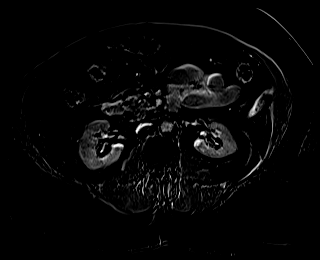
[im 22/80]
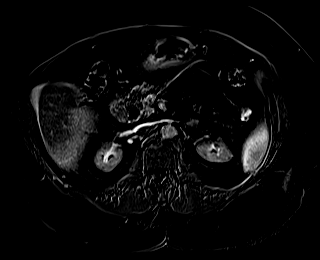
[im 29/80]
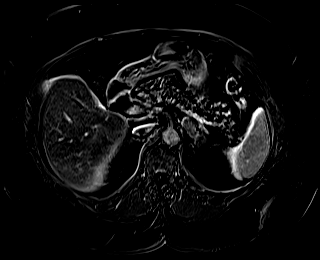
[im 36/80]
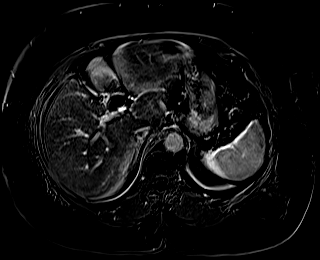
[im 44/80]
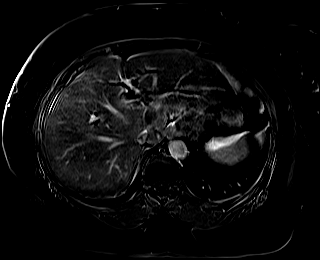
[im 51/80]
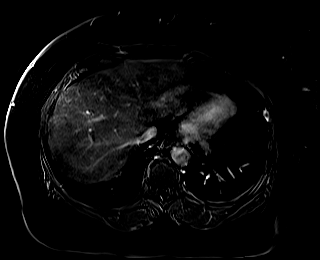
[im 58/80]
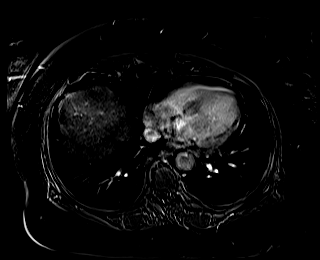
[im 65/80]
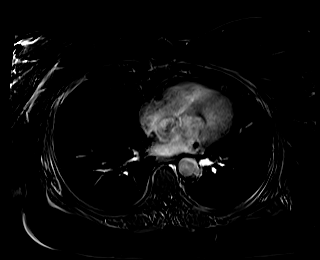
[im 72/80]
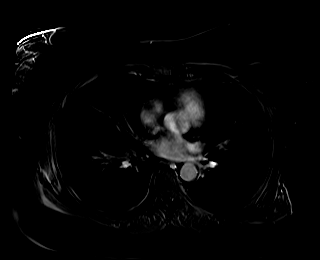
[im 80/80]
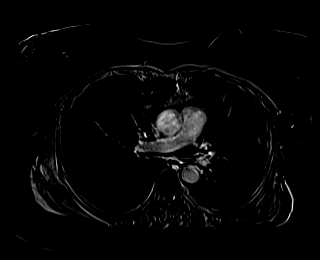

[Series 105: sub_delay · axial · 3.0mm · 1.25mm/px · z∈[-26,+211]mm · 12 of 80 slices shown]
[im 1/80]
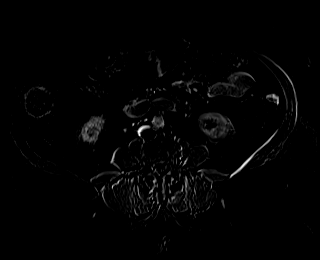
[im 8/80]
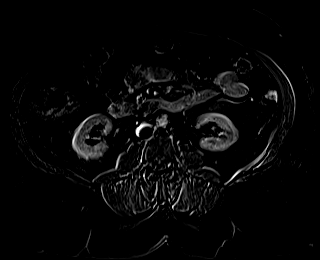
[im 15/80]
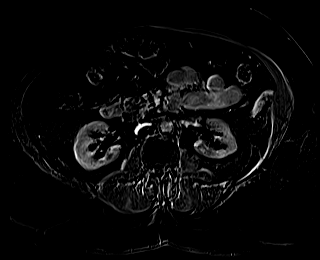
[im 22/80]
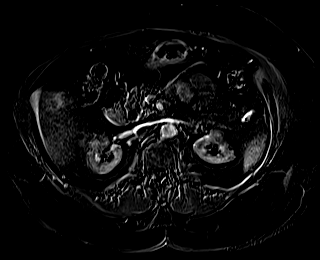
[im 29/80]
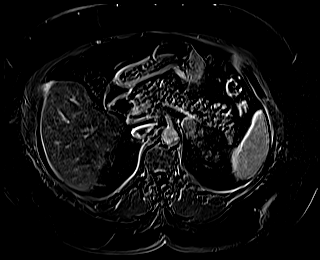
[im 36/80]
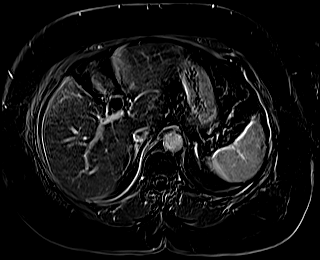
[im 44/80]
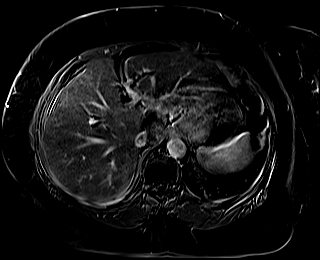
[im 51/80]
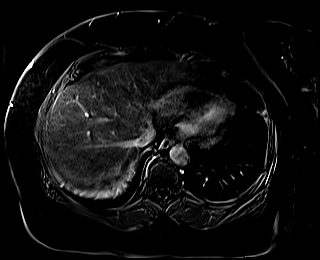
[im 58/80]
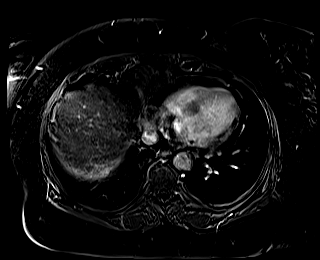
[im 65/80]
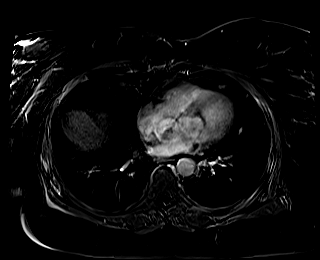
[im 72/80]
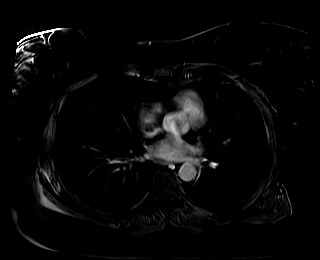
[im 80/80]
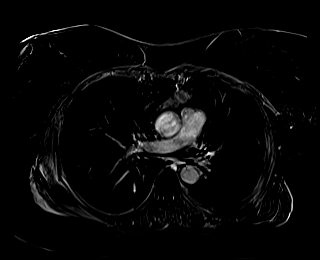

[48 of 48 positions shown; findings below may reference images not displayed]

FINDINGS: Lower chest: Mild cardiomegaly, without pericardial or pleural
effusion.

Hepatobiliary: Moderate to marked hepatic steatosis. No focal liver
lesion. Interval cholecystectomy. Intrahepatic biliary duct
dilatation has decreased. For example, left hepatic duct measures 9
mm on [DATE] versus 17 mm at the same level on the prior exam (when
remeasured).

Common duct measures 12 mm on [DATE] versus 19 mm on the prior exam
(when remeasured). No residual choledocholithiasis.

Pancreas:  Normal, without mass or ductal dilatation.

Spleen:  Normal in size, without focal abnormality.

Adrenals/Urinary Tract: Normal right adrenal gland. Left adrenal
nodule of up to 2.2 cm is similar to on the prior exam, consistent
with an adenoma. Normal kidneys, without hydronephrosis.

Stomach/Bowel: Normal stomach and abdominal bowel loops.

Vascular/Lymphatic: Advanced aortic and branch vessel
atherosclerosis. No retroperitoneal or retrocrural adenopathy.

Other:  No ascites.

Musculoskeletal: No acute osseous abnormality.
IMPRESSION: 1. Interval cholecystectomy with improvement in biliary duct
dilatation. No residual choledocholithiasis identified.
2.  No acute process or explanation for right-sided pain.
3. Left adrenal adenoma.
4.  Aortic Atherosclerosis ([8U]-[8U]).
5. Hepatic steatosis

## 2020-09-28 IMAGING — MR MR 3D RECON AT SCANNER
14 of 16 series · 39 of 48 positions shown · IV contrast (gadavist)
Comparison: [DATE]

CLINICAL DATA: History of biliary sphincterotomy and
choledocholithiasis/cholecystectomy. Abdominal pain.

EXAM:
MRI ABDOMEN WITHOUT AND WITH CONTRAST (INCLUDING MRCP)
TECHNIQUE: Multiplanar multisequence MR imaging of the abdomen was performed
both before and after the administration of intravenous contrast.
Heavily T2-weighted images of the biliary and pancreatic ducts were
obtained, and three-dimensional MRCP images were rendered by post
processing.
CONTRAST:  10mL GADAVIST GADOBUTROL 1 MMOL/ML IV SOLN initial
contrast extravasated into the left wrist and the patient was
evaluated. A second bolus of 10 cc of contrast was then
administered.

[Series 3: DWI · axial · 6.0mm · 1.72mm/px · z∈[-36,+216]mm · 3 of 72 slices shown (1 of 2)]
[im 1/72]
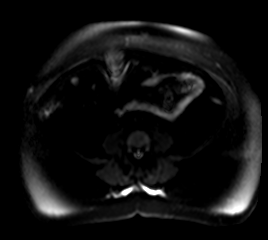
[im 36/72]
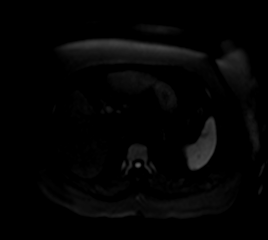
[im 72/72]
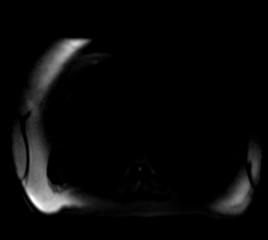

[Series 4: DWI · axial · 6.0mm · 1.72mm/px · 1 of 36 slices shown (2 of 2)]
[im 1/36]
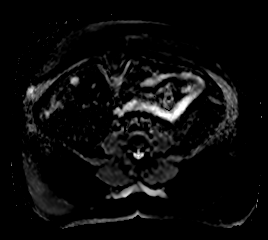

[Series 5: T2 fat-sat · axial · 6.0mm · 1.41mm/px · 1 of 36 slices shown]
[im 1/36]
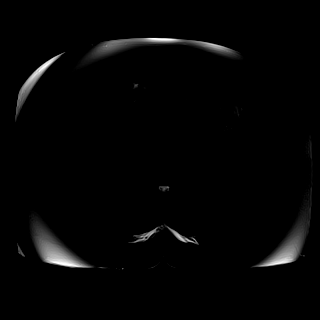

[Series 9: T2 · coronal · 6.0mm · 1.68mm/px · 2 of 36 slices shown (1 of 2)]
[im 1/36]
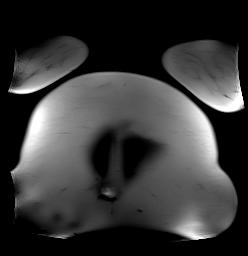
[im 36/36]
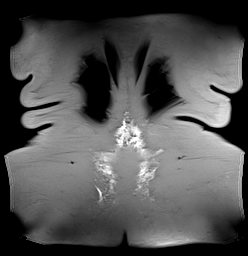

[Series 11: T1 · axial · 3.0mm · 1.38mm/px · z∈[-48,+189]mm · 4 of 80 slices shown (1 of 2)]
[im 1/80]
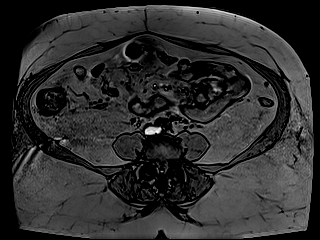
[im 27/80]
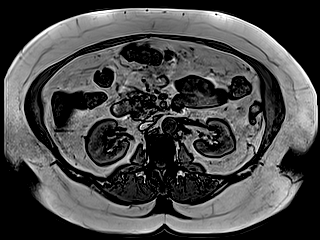
[im 53/80]
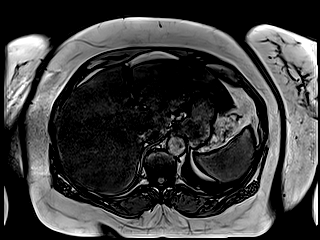
[im 80/80]
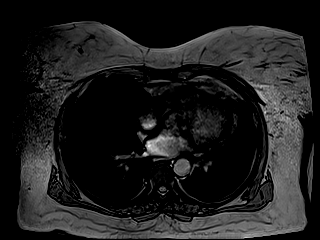

[Series 12: T1 · axial · 3.0mm · 1.38mm/px · z∈[-48,+189]mm · 4 of 80 slices shown (2 of 2)]
[im 1/80]
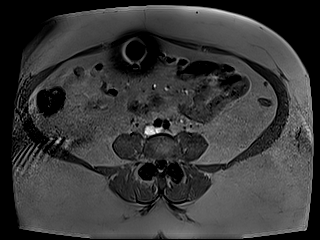
[im 27/80]
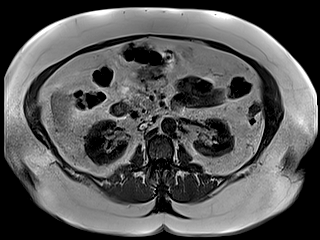
[im 53/80]
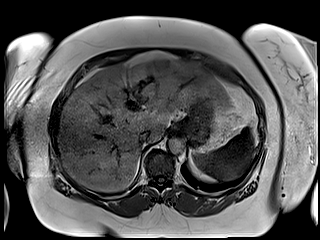
[im 80/80]
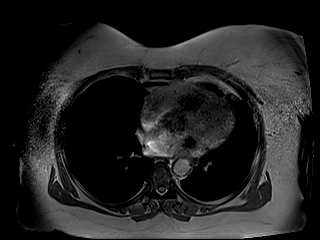

[Series 13: cor obl thk · sagittal · 50.0mm · 0.78mm/px · 1 of 8 slices shown]
[im 1/8]
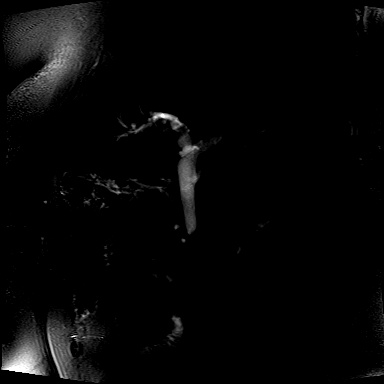

[Series 15: cor_3d_spc_trig · coronal · 1.0mm · 0.59mm/px · 4 of 72 slices shown]
[im 1/72]
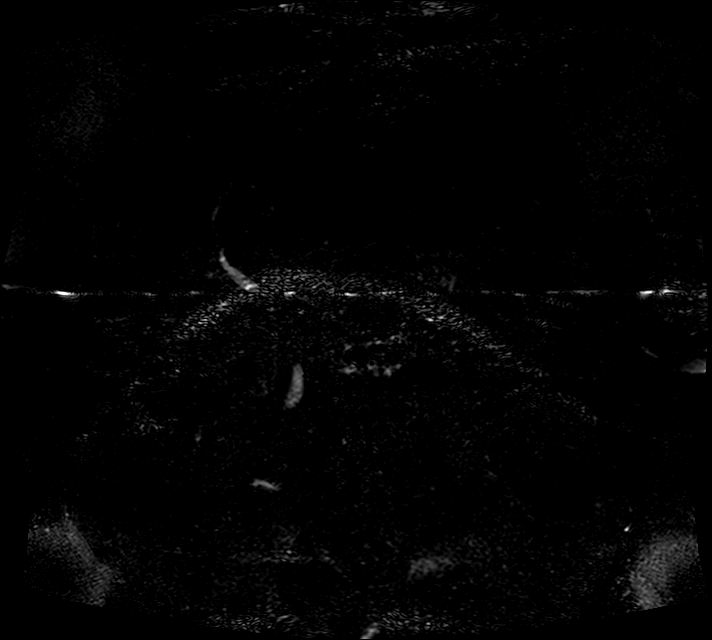
[im 24/72]
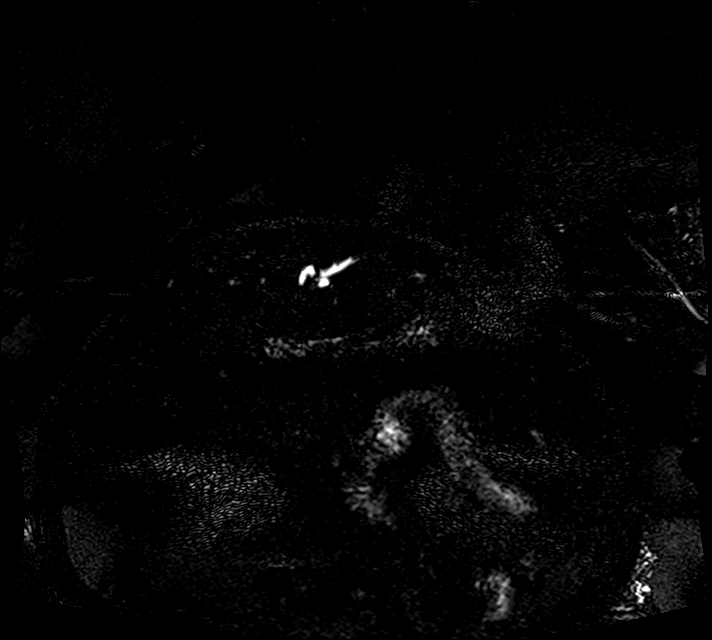
[im 48/72]
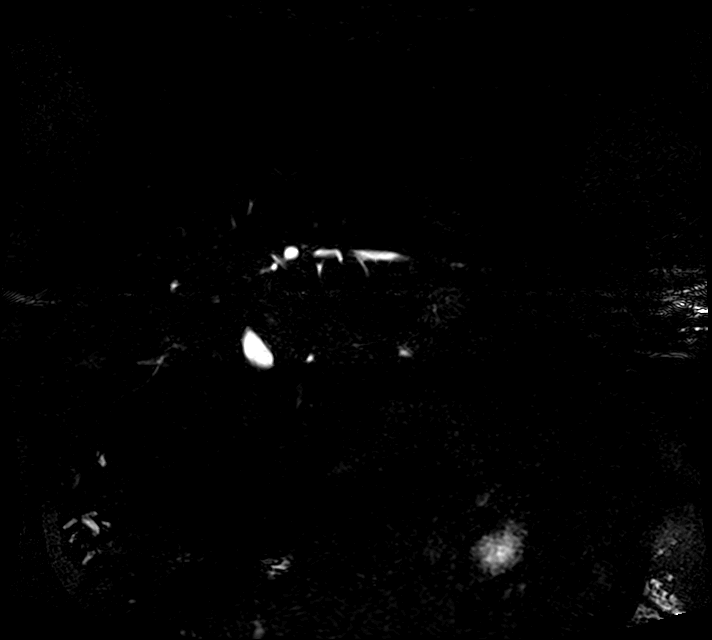
[im 72/72]
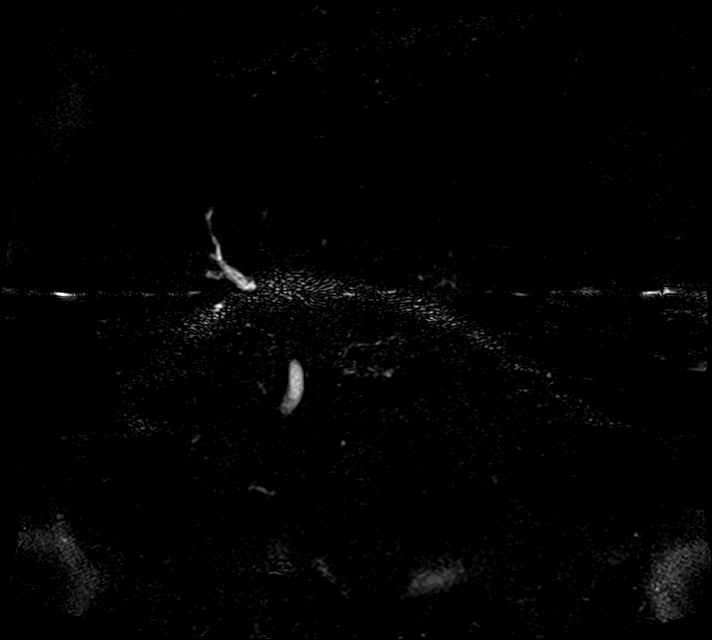

[Series 17: T2 · axial · 6.0mm · 1.64mm/px · z∈[-58,+194]mm · 2 of 36 slices shown (2 of 2)]
[im 1/36]
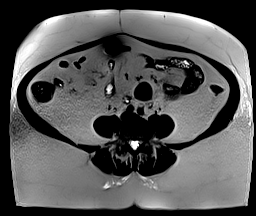
[im 36/36]
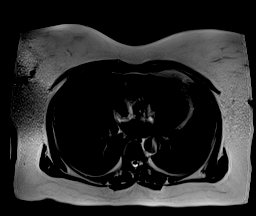

[Series 40: T1 dynamic · axial · 3.0mm · 1.25mm/px · z∈[-26,+211]mm · 4 of 80 slices shown (1 of 5)]
[im 1/80]
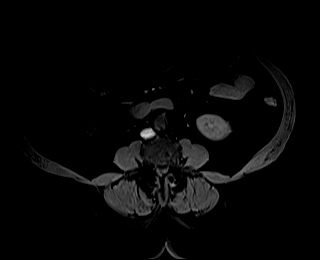
[im 27/80]
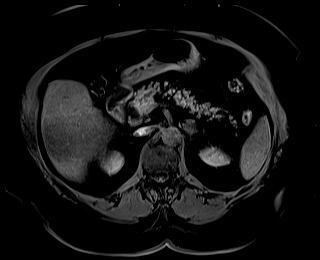
[im 53/80]
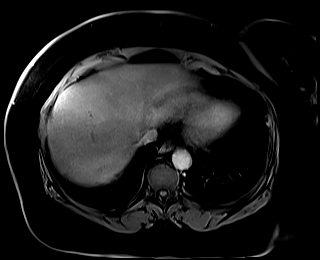
[im 80/80]
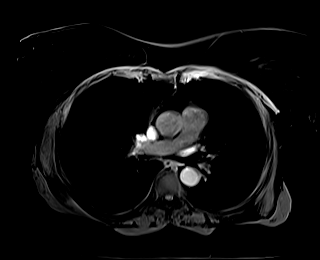

[Series 43: T1 dynamic · axial · 3.0mm · 1.25mm/px · z∈[-26,+211]mm · 4 of 80 slices shown (2 of 5)]
[im 1/80]
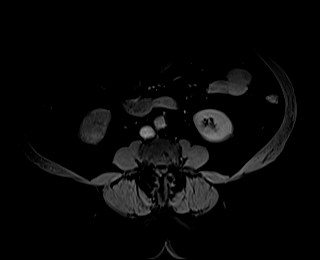
[im 27/80]
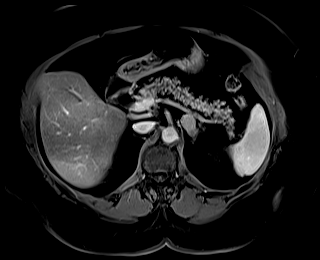
[im 53/80]
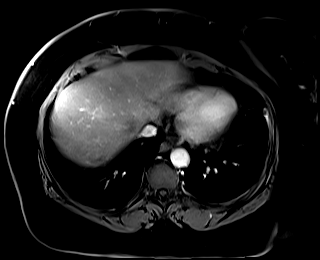
[im 80/80]
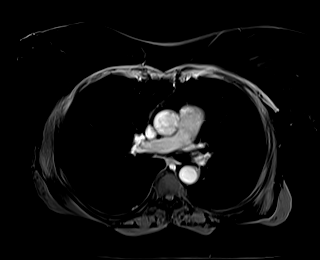

[Series 45: T1 dynamic · axial · 3.0mm · 1.25mm/px · z∈[-26,+211]mm · 4 of 80 slices shown (3 of 5)]
[im 1/80]
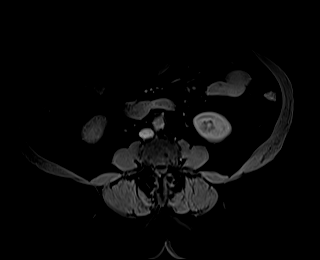
[im 27/80]
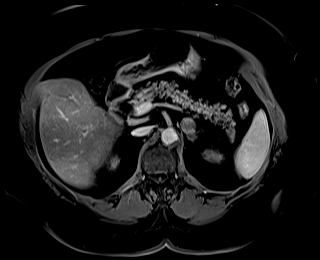
[im 53/80]
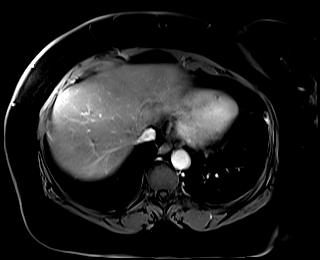
[im 80/80]
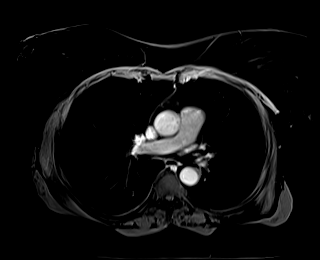

[Series 47: T1 dynamic · axial · 3.0mm · 1.25mm/px · z∈[-26,+211]mm · 4 of 80 slices shown (4 of 5)]
[im 1/80]
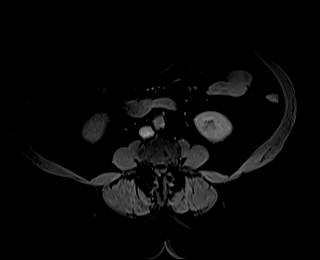
[im 27/80]
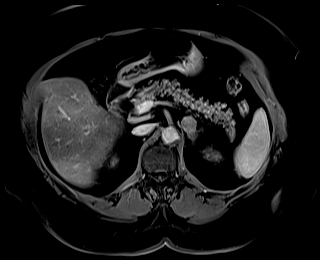
[im 53/80]
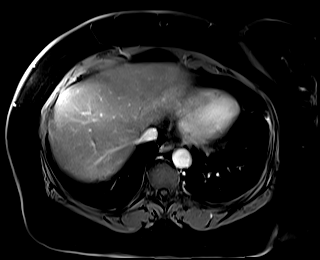
[im 80/80]
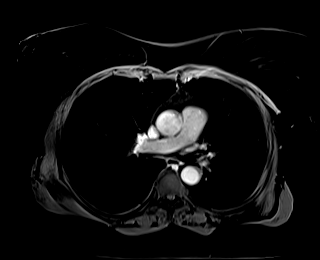

[Series 49: T1 dynamic · coronal · 3.0mm · 0.87mm/px · 1 of 80 slices shown (5 of 5)]
[im 1/80]
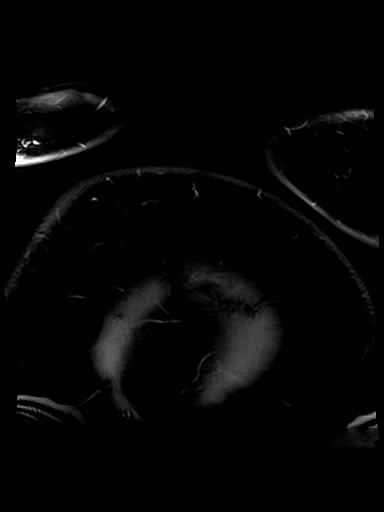

[39 of 48 positions shown; findings below may reference images not displayed]

FINDINGS: Lower chest: Mild cardiomegaly, without pericardial or pleural
effusion.

Hepatobiliary: Moderate to marked hepatic steatosis. No focal liver
lesion. Interval cholecystectomy. Intrahepatic biliary duct
dilatation has decreased. For example, left hepatic duct measures 9
mm on [DATE] versus 17 mm at the same level on the prior exam (when
remeasured).

Common duct measures 12 mm on [DATE] versus 19 mm on the prior exam
(when remeasured). No residual choledocholithiasis.

Pancreas:  Normal, without mass or ductal dilatation.

Spleen:  Normal in size, without focal abnormality.

Adrenals/Urinary Tract: Normal right adrenal gland. Left adrenal
nodule of up to 2.2 cm is similar to on the prior exam, consistent
with an adenoma. Normal kidneys, without hydronephrosis.

Stomach/Bowel: Normal stomach and abdominal bowel loops.

Vascular/Lymphatic: Advanced aortic and branch vessel
atherosclerosis. No retroperitoneal or retrocrural adenopathy.

Other:  No ascites.

Musculoskeletal: No acute osseous abnormality.
IMPRESSION: 1. Interval cholecystectomy with improvement in biliary duct
dilatation. No residual choledocholithiasis identified.
2.  No acute process or explanation for right-sided pain.
3. Left adrenal adenoma.
4.  Aortic Atherosclerosis ([8U]-[8U]).
5. Hepatic steatosis

## 2020-09-28 MED ORDER — GADOBUTROL 1 MMOL/ML IV SOLN
10.0000 mL | Freq: Once | INTRAVENOUS | Status: AC | PRN
  Administered 2020-09-28: 10 mL via INTRAVENOUS

## 2020-09-28 NOTE — Progress Notes (Signed)
Ms. Zukauskas was seen in MR today for scheduled MRCP.  MR tech called for PA to examine patient after extravasation into left forearm of 10cc contrast.  On exam, patient is A/Ox3, lying on table outside scanner and in no acute distress. A cold pack is over area of extravasation. She expresses that she feels "fine", but feels like a "pin cushion".  She reports momentary tingling in her hand that has fully resolved and denies any pain.    She has FROM of hand, wrist, and elbow and no strength deficit.  There is no palpable collection of fluid.    Patient was informed of warning signs to be aware of that would represent complications secondary to extravasation up to and including compartment syndrome.  She has been instructed to return to ED if experiencing symptoms or having other concerns.  Given the small amount of contrast administered, it is thought to be unlikely she will develop complications.  Patient voiced understanding and agreement.     Electronically Signed: Pasty Spillers 09/28/2020, 10:08 AM

## 2020-09-30 ENCOUNTER — Other Ambulatory Visit: Payer: Self-pay

## 2020-09-30 DIAGNOSIS — R945 Abnormal results of liver function studies: Secondary | ICD-10-CM

## 2020-09-30 DIAGNOSIS — R7989 Other specified abnormal findings of blood chemistry: Secondary | ICD-10-CM

## 2020-12-16 ENCOUNTER — Other Ambulatory Visit (INDEPENDENT_AMBULATORY_CARE_PROVIDER_SITE_OTHER): Payer: 59

## 2020-12-16 DIAGNOSIS — R7989 Other specified abnormal findings of blood chemistry: Secondary | ICD-10-CM

## 2020-12-16 LAB — HEPATIC FUNCTION PANEL
ALT: 31 U/L (ref 0–35)
AST: 22 U/L (ref 0–37)
Albumin: 4.1 g/dL (ref 3.5–5.2)
Alkaline Phosphatase: 111 U/L (ref 39–117)
Bilirubin, Direct: 0 mg/dL (ref 0.0–0.3)
Total Bilirubin: 0.3 mg/dL (ref 0.2–1.2)
Total Protein: 7.6 g/dL (ref 6.0–8.3)

## 2021-04-17 ENCOUNTER — Ambulatory Visit: Payer: Self-pay | Admitting: Family Medicine

## 2021-04-19 ENCOUNTER — Ambulatory Visit (INDEPENDENT_AMBULATORY_CARE_PROVIDER_SITE_OTHER): Payer: PPO | Admitting: Family Medicine

## 2021-04-19 ENCOUNTER — Encounter: Payer: Self-pay | Admitting: Family Medicine

## 2021-04-19 VITALS — BP 133/77 | HR 79 | Temp 98.1°F | Resp 16 | Ht 64.0 in | Wt 287.8 lb

## 2021-04-19 DIAGNOSIS — I10 Essential (primary) hypertension: Secondary | ICD-10-CM

## 2021-04-19 DIAGNOSIS — Z13228 Encounter for screening for other metabolic disorders: Secondary | ICD-10-CM

## 2021-04-19 DIAGNOSIS — Z130 Encounter for screening for diseases of the blood and blood-forming organs and certain disorders involving the immune mechanism: Secondary | ICD-10-CM

## 2021-04-19 DIAGNOSIS — Z0000 Encounter for general adult medical examination without abnormal findings: Secondary | ICD-10-CM

## 2021-04-19 DIAGNOSIS — E785 Hyperlipidemia, unspecified: Secondary | ICD-10-CM

## 2021-04-19 DIAGNOSIS — Z1329 Encounter for screening for other suspected endocrine disorder: Secondary | ICD-10-CM | POA: Diagnosis not present

## 2021-04-19 MED ORDER — AMLODIPINE BESYLATE 10 MG PO TABS: 10.0000 mg | ORAL_TABLET | Freq: Every evening | ORAL | 1 refills | Status: DC

## 2021-04-19 MED ORDER — ATORVASTATIN CALCIUM 80 MG PO TABS: 80.0000 mg | ORAL_TABLET | Freq: Every evening | ORAL | 3 refills | Status: DC

## 2021-04-19 NOTE — Progress Notes (Signed)
? ?Established Patient Office Visit ? ?Subjective:  ?Patient ID: Hannah Beard, female    DOB: Oct 26, 1955  Age: 66 y.o. MRN: [id number] ? ?CC: No chief complaint on file. ? ? ?HPI ?Hannah Beard presents for routine annual exam. Patient denies acute complaints.  ? ?Past Medical History:  ?Diagnosis Date  ? (HFpEF) heart failure with preserved ejection fraction ([place]) 02/2014  ? volume excess in setting of STEMI and volume resuscitation (EF preserved) post cardiac cath;  last echo 02-20-2014  ef 55-60%. G2DD  ? Arthritis   ? back L3-S1  ? Bilateral cataracts   ? MD just watching  ? Bladder cancer ([place])   ? CAD (coronary artery disease) previous cardiologist Dr Burt Knack, lov note in epic 04-28-2014  (03-13-2019  per pt has not seen any doctor in approx. 5 yrs ago, but denies any cardiac s&s)  ? a. 02/19/14 inf STEMI s/p DES to LCx and moderate stenosis midRCA  ? Eczema   ? History of pleural empyema   ? left side  08-30-2009 s/p  left VATS w/ drainage/ decortication  ? History of ST elevation myocardial infarction (STEMI) 02/19/2014  ? HLD (hyperlipidemia)   ? Hypertension   ? 03-13-2019  was on medication 5 yrs ago but none since this time due to not having seen any doctor for 5 yrs due to insurance issue's  ? Pneumonia   ? x  1  ? S/P drug eluting coronary stent placement   ? 02-20-2004   PCI and DES x1 to LCx  ? Skin cancer of arm   ? Wears glasses   ? ? ?Past Surgical History:  ?Procedure Laterality Date  ? BILIARY DILATION  12/14/2019  ? Procedure: BILIARY DILATION;  Surgeon: Rush Landmark Telford Nab., MD;  Location: [place];  Service: Gastroenterology;;  ? BIOPSY  12/14/2019  ? Procedure: BIOPSY;  Surgeon: [name]., MD;  Location: [place];  Service: Gastroenterology;;  ? BREAST BIOPSY Right 06/01/2019  ? MILD FIBROCYSTIC CHANGE. FIBROADENOMA  ? CARDIAC CATHETERIZATION  02/19/2014  ? left  ? CHOLECYSTECTOMY N/A 03/03/2020  ? Procedure: LAPAROSCOPIC CHOLECYSTECTOMY WITH INTRAOPERATIVE  CHOLANGIOGRAM;  Surgeon: Kinsinger, Arta Bruce, MD;  Location: [facility];  Service: General;  Laterality: N/A;  ? CYSTOSCOPY WITH BIOPSY N/A 04/29/2019  ? Procedure: CYSTOSCOPY WITH BLADDER BIOPSY/ RIGHT STENT REMOVAL;  Surgeon: Ceasar Mons, MD;  Location: [facility];  Service: Urology;  Laterality: N/A;  ? DILATION AND CURETTAGE OF UTERUS  yrs ago  ? ENDOSCOPIC RETROGRADE CHOLANGIOPANCREATOGRAPHY (ERCP) WITH PROPOFOL N/A 12/14/2019  ? Procedure: ENDOSCOPIC RETROGRADE CHOLANGIOPANCREATOGRAPHY (ERCP) WITH PROPOFOL;  Surgeon: Rush Landmark Telford Nab., MD;  Location: [place];  Service: Gastroenterology;  Laterality: N/A;  ? ESOPHAGOGASTRODUODENOSCOPY (EGD) WITH PROPOFOL N/A 12/14/2019  ? Procedure: ESOPHAGOGASTRODUODENOSCOPY (EGD) WITH PROPOFOL;  Surgeon: Rush Landmark Telford Nab., MD;  Location: [place];  Service: Gastroenterology;  Laterality: N/A;  ? LEFT HEART CATH N/A 02/19/2014  ? Procedure: LEFT HEART CATH;  Surgeon: Blane Ohara, MD;  Location: [facility] CATH LAB;  Service: Cardiovascular;  Laterality: N/A;  ? REMOVAL OF STONES  12/14/2019  ? Procedure: REMOVAL OF STONES;  Surgeon: Rush Landmark Telford Nab., MD;  Location: [place];  Service: Gastroenterology;;  ? skin cancer removal     ? 02/2020 - left upper arm   ? SPHINCTEROTOMY  12/14/2019  ? Procedure: SPHINCTEROTOMY;  Surgeon: Mansouraty, Telford Nab., MD;  Location: [place];  Service: Gastroenterology;;  ? TRANSURETHRAL RESECTION OF BLADDER TUMOR N/A 03/18/2019  ? Procedure: TRANSURETHRAL RESECTION  OF BLADDER TUMOR (TURBT) WITH CYSTOSCOPY RIGHT STENT PLACEMENT/ INTRAVESICAL INSTILLATION OF GEMCITABINE;  Surgeon: Ceasar Mons, MD;  Location: [facility];  Service: Urology;  Laterality: N/A;  ? TRANSURETHRAL RESECTION OF BLADDER TUMOR N/A 09/23/2019  ? Procedure: Cystoscopy with TURBT (small) and resection of right ureteral orifice, Right JJ stent placement; Right retrograde pyelogram with  intraoperative interpretation of fluoroscopic imaging post operative instillation of gemcitabine;  Surgeon: Ceasar Mons, MD;  Location: [facility];  Service: Urology;  Laterality: N/A;  ? TUBAL LIGATION Bilateral yrs ago  ? VIDEO ASSISTED THORACOSCOPY (VATS)/EMPYEMA Left 08-30-2009   dr hendrickson  _0   ? ? ?Family History  ?Problem Relation Age of Onset  ? Cancer Mother   ? Diabetes Mother   ? Heart disease Mother   ? Hyperlipidemia Mother   ? Hypertension Mother   ? Heart disease Maternal Grandmother   ? Heart failure Maternal Grandmother   ? Heart disease Maternal Grandfather   ? Diabetes Maternal Grandfather   ? Heart attack Maternal Grandfather   ? Cirrhosis Paternal Grandmother   ? Diabetes Brother   ? Heart disease Brother   ? Hyperlipidemia Brother   ? Hypertension Brother   ? Diabetes Brother   ? Hyperlipidemia Brother   ? Hypertension Brother   ? Stroke Neg Hx   ? Colon cancer Neg Hx   ? Pancreatic cancer Neg Hx   ? Stomach cancer Neg Hx   ? Esophageal cancer Neg Hx   ? Inflammatory bowel disease Neg Hx   ? Liver disease Neg Hx   ? Rectal cancer Neg Hx   ? ? ?Social History  ? ?Socioeconomic History  ? Marital status: Single  ?  Spouse name: Not on file  ? Number of children: Not on file  ? Years of education: Not on file  ? Highest education level: Not on file  ?Occupational History  ? Not on file  ?Tobacco Use  ? Smoking status: Former  ?  Years: 20.00  ?  Types: Cigarettes  ?  Quit date: 08/28/2009  ?  Years since quitting: 11.6  ? Smokeless tobacco: Never  ?Vaping Use  ? Vaping Use: Never used  ?Substance and Sexual Activity  ? Alcohol use: Yes  ?  Alcohol/week: 0.0 standard drinks  ?  Comment: rare  ? Drug use: Never  ? Sexual activity: Not on file  ?Other Topics Concern  ? Not on file  ?Social History Narrative  ? ** Merged History Encounter **  ?    ? ?Social Determinants of Health  ? ?Financial Resource Strain: Not on file  ?Food Insecurity: Not on file   ?Transportation Needs: Not on file  ?Physical Activity: Not on file  ?Stress: Not on file  ?Social Connections: Not on file  ?Intimate Partner Violence: Not on file  ? ? ?ROS ?Review of Systems  ?All other systems reviewed and are negative. ? ?Objective:  ? ?Today's Vitals: BP 133/77   Pulse 79   Temp 98.1 ?F (36.7 ?C) (Oral)   Resp 16   Ht 5' 4" (1.626 m)   Wt 287 lb 12.8 oz (130.5 kg)   LMP  (LMP Unknown)   SpO2 91%   BMI 49.40 kg/m?  ? ?Physical Exam ?Vitals and nursing note reviewed.  ?Constitutional:   ?   General: She is not in acute distress. ?   Appearance: She is obese.  ?HENT:  ?   Head: Normocephalic and atraumatic.  ?  Right Ear: Tympanic membrane, ear canal and external ear normal.  ?   Left Ear: Tympanic membrane, ear canal and external ear normal.  ?   Nose: Nose normal.  ?   Mouth/Throat:  ?   Mouth: Mucous membranes are moist.  ?   Pharynx: Oropharynx is clear.  ?Eyes:  ?   Conjunctiva/sclera: Conjunctivae normal.  ?   Pupils: Pupils are equal, round, and reactive to light.  ?Neck:  ?   Thyroid: No thyromegaly.  ?Cardiovascular:  ?   Rate and Rhythm: Normal rate and regular rhythm.  ?   Heart sounds: Normal heart sounds. No murmur heard. ?Pulmonary:  ?   Effort: Pulmonary effort is normal. No respiratory distress.  ?   Breath sounds: Normal breath sounds.  ?Abdominal:  ?   General: There is no distension.  ?   Palpations: Abdomen is soft. There is no mass.  ?   Tenderness: There is no abdominal tenderness.  ?Musculoskeletal:     ?   General: Normal range of motion.  ?   Cervical back: Normal range of motion and neck supple.  ?Skin: ?   General: Skin is warm and dry.  ?Neurological:  ?   General: No focal deficit present.  ?   Mental Status: She is alert and oriented to person, place, and time.  ?Psychiatric:     ?   Mood and Affect: Mood normal.     ?   Behavior: Behavior normal.  ? ? ?Assessment & Plan:  ?1. Encounter for annual physical exam ?Routine labs ordered ?- CMP14+EGFR;  Future ? ?2. Screening for deficiency anemia ? ?- CBC with Differential; Future ? ?3. Screening for endocrine/metabolic/immunity disorders ? ?- Hemoglobin A1c; Future ?- TSH; Future ?- Vitamin D, 25-hydroxy; Future ? ? ?Outpatient Encounter Medicatio

## 2021-04-24 ENCOUNTER — Telehealth: Payer: Self-pay | Admitting: Family Medicine

## 2021-04-24 ENCOUNTER — Other Ambulatory Visit: Payer: Self-pay | Admitting: *Deleted

## 2021-04-24 DIAGNOSIS — E785 Hyperlipidemia, unspecified: Secondary | ICD-10-CM

## 2021-04-24 DIAGNOSIS — Z0000 Encounter for general adult medical examination without abnormal findings: Secondary | ICD-10-CM

## 2021-04-24 DIAGNOSIS — Z130 Encounter for screening for diseases of the blood and blood-forming organs and certain disorders involving the immune mechanism: Secondary | ICD-10-CM

## 2021-04-24 LAB — CMP14+EGFR
ALT: 57 IU/L — ABNORMAL HIGH (ref 0–32)
AST: 42 IU/L — ABNORMAL HIGH (ref 0–40)
Albumin/Globulin Ratio: 1.5 (ref 1.2–2.2)
Albumin: 4.4 g/dL (ref 3.8–4.8)
Alkaline Phosphatase: 151 IU/L — ABNORMAL HIGH (ref 44–121)
BUN/Creatinine Ratio: 15 (ref 12–28)
BUN: 18 mg/dL (ref 8–27)
Bilirubin Total: 0.3 mg/dL (ref 0.0–1.2)
CO2: 24 mmol/L (ref 20–29)
Calcium: 9.4 mg/dL (ref 8.7–10.3)
Chloride: 106 mmol/L (ref 96–106)
Creatinine, Ser: 1.19 mg/dL — ABNORMAL HIGH (ref 0.57–1.00)
Globulin, Total: 2.9 g/dL (ref 1.5–4.5)
Glucose: 149 mg/dL — ABNORMAL HIGH (ref 70–99)
Potassium: 4.2 mmol/L (ref 3.5–5.2)
Sodium: 145 mmol/L — ABNORMAL HIGH (ref 134–144)
Total Protein: 7.3 g/dL (ref 6.0–8.5)
eGFR: 51 mL/min/{1.73_m2} — ABNORMAL LOW (ref >59)

## 2021-04-24 LAB — CBC WITH DIFFERENTIAL/PLATELET
Basophils Absolute: 0 10*3/uL (ref 0.0–0.2)
Basos: 1 %
EOS (ABSOLUTE): 0.2 10*3/uL (ref 0.0–0.4)
Eos: 2 %
Hematocrit: 49.6 % — ABNORMAL HIGH (ref 34.0–46.6)
Hemoglobin: 16.4 g/dL — ABNORMAL HIGH (ref 11.1–15.9)
Immature Grans (Abs): 0 10*3/uL (ref 0.0–0.1)
Immature Granulocytes: 0 %
Lymphocytes Absolute: 2.4 10*3/uL (ref 0.7–3.1)
Lymphs: 35 %
MCH: 30.5 pg (ref 26.6–33.0)
MCHC: 33.1 g/dL (ref 31.5–35.7)
MCV: 92 fL (ref 79–97)
Monocytes Absolute: 0.6 10*3/uL (ref 0.1–0.9)
Monocytes: 9 %
Neutrophils Absolute: 3.5 10*3/uL (ref 1.4–7.0)
Neutrophils: 53 %
Platelets: 282 10*3/uL (ref 150–450)
RBC: 5.37 x10E6/uL — ABNORMAL HIGH (ref 3.77–5.28)
RDW: 12.2 % (ref 11.7–15.4)
WBC: 6.7 10*3/uL (ref 3.4–10.8)

## 2021-04-24 LAB — LIPID PANEL
Chol/HDL Ratio: 3.1 ratio (ref 0.0–4.4)
Cholesterol, Total: 137 mg/dL (ref 100–199)
HDL: 44 mg/dL (ref >39)
LDL Chol Calc (NIH): 70 mg/dL (ref 0–99)
Triglycerides: 131 mg/dL (ref 0–149)
VLDL Cholesterol Cal: 23 mg/dL (ref 5–40)

## 2021-04-24 LAB — HEMOGLOBIN A1C
Est. average glucose Bld gHb Est-mCnc: 163 mg/dL
Hgb A1c MFr Bld: 7.3 % — ABNORMAL HIGH (ref 4.8–5.6)

## 2021-04-24 LAB — VITAMIN D 25 HYDROXY (VIT D DEFICIENCY, FRACTURES): Vit D, 25-Hydroxy: 44.9 ng/mL (ref 30.0–100.0)

## 2021-04-24 LAB — TSH: TSH: 3.73 u[IU]/mL (ref 0.450–4.500)

## 2021-04-24 NOTE — Telephone Encounter (Signed)
Pt is at [facility] this morning to get her fasting labs.  The lab tech was unable to draw them at her visit last week and told her she could go to any [facility].   ?However, labs are clinic collect and [facility] cannot see them. ?Can you release these labs? Pt is waiting. ? ?Also can fax: Fax  [phone]  ? ? ?Cb [phone] ?

## 2021-05-17 ENCOUNTER — Encounter: Payer: Self-pay | Admitting: Physician Assistant

## 2021-05-17 ENCOUNTER — Ambulatory Visit: Payer: PPO | Admitting: Physician Assistant

## 2021-05-17 DIAGNOSIS — Z1283 Encounter for screening for malignant neoplasm of skin: Secondary | ICD-10-CM | POA: Diagnosis not present

## 2021-05-17 DIAGNOSIS — Z85828 Personal history of other malignant neoplasm of skin: Secondary | ICD-10-CM

## 2021-05-17 DIAGNOSIS — D225 Melanocytic nevi of trunk: Secondary | ICD-10-CM

## 2021-05-17 DIAGNOSIS — D485 Neoplasm of uncertain behavior of skin: Secondary | ICD-10-CM

## 2021-05-17 NOTE — Patient Instructions (Signed)

## 2021-05-24 ENCOUNTER — Encounter: Payer: Self-pay | Admitting: Physician Assistant

## 2021-05-24 NOTE — Progress Notes (Signed)
? ?  Follow-Up Visit ?  ?Subjective  ?Hannah Beard is a 66 y.o. female who presents for the following: Annual Exam (Patient here today for full body yearly skin check, no new concerns. Personal history of non mole skin cancer. ). ? ? ?The following portions of the chart were reviewed this encounter and updated as appropriate:  Tobacco  Allergies  Meds  Problems  Med Hx  Surg Hx  Fam Hx   ?  ? ?Objective  ?Well appearing patient in no apparent distress; mood and affect are within normal limits. ? ?A full examination was performed including scalp, head, eyes, ears, nose, lips, neck, chest, axillae, abdomen, back, buttocks, bilateral upper extremities, bilateral lower extremities, hands, feet, fingers, toes, fingernails, and toenails. All findings within normal limits unless otherwise noted below. ? ?Left Upper Back ?Bichromic dark nested macule.  ? ? ? ? ? ?Assessment & Plan  ?Neoplasm of uncertain behavior of skin ?Left Upper Back ? ?Skin / nail biopsy ?Type of biopsy: tangential   ?Informed consent: discussed and consent obtained   ?Timeout: patient name, date of birth, surgical site, and procedure verified   ?Anesthesia: the lesion was anesthetized in a standard fashion   ?Anesthetic:  1% lidocaine w/ epinephrine 1-100,000 local infiltration ?Instrument used: flexible razor blade   ?Hemostasis achieved with: aluminum chloride and electrodesiccation   ?Outcome: patient tolerated procedure well   ?Post-procedure details: wound care instructions given   ? ?Specimen 1 - Surgical pathology ?Differential Diagnosis: atypia ? ?Check Margins: No ? ? ? ?I, Mekai Wilkinson, PA-C, have reviewed all documentation's for this visit.  The documentation on 05/24/21 for the exam, diagnosis, procedures and orders are all accurate and complete. ?

## 2021-07-05 DIAGNOSIS — M47817 Spondylosis without myelopathy or radiculopathy, lumbosacral region: Secondary | ICD-10-CM | POA: Insufficient documentation

## 2021-07-05 DIAGNOSIS — G8929 Other chronic pain: Secondary | ICD-10-CM | POA: Insufficient documentation

## 2021-08-10 ENCOUNTER — Encounter: Payer: Self-pay | Admitting: Gastroenterology

## 2021-10-10 ENCOUNTER — Other Ambulatory Visit: Payer: Self-pay | Admitting: Family Medicine

## 2021-10-10 DIAGNOSIS — I10 Essential (primary) hypertension: Secondary | ICD-10-CM

## 2021-10-10 NOTE — Telephone Encounter (Signed)
Requested Prescriptions  Pending Prescriptions Disp Refills  . amLODipine (NORVASC) 10 MG tablet [Pharmacy Med Name: AMLODIPINE BESYLATE '10MG'$  TABLETS] 90 tablet 1    Sig: TAKE 1 TABLET(10 MG) BY MOUTH EVERY EVENING     Cardiovascular: Calcium Channel Blockers 2 Passed - 10/10/2021  3:19 AM      Passed - Last BP in normal range    BP Readings from Last 1 Encounters:  04/19/21 133/77         Passed - Last Heart Rate in normal range    Pulse Readings from Last 1 Encounters:  04/19/21 79         Passed - Valid encounter within last 6 months    Recent Outpatient Visits          5 months ago Encounter for annual physical exam   [facility], MD   1 year ago Encounter for annual physical exam   Primary Care at [facility], Bayard Beaver, MD   1 year ago Essential hypertension   Primary Care at [facility], Bayard Beaver, MD   1 year ago Essential hypertension   Primary Care at [facility], Bayard Beaver, MD   2 years ago Essential hypertension   Primary Care at [facility], Bayard Beaver, MD      Future Appointments            In 1 week Dorna Mai, MD Primary Care at [facility]

## 2021-10-23 ENCOUNTER — Encounter: Payer: Self-pay | Admitting: Family Medicine

## 2021-10-23 ENCOUNTER — Ambulatory Visit (INDEPENDENT_AMBULATORY_CARE_PROVIDER_SITE_OTHER): Payer: PPO | Admitting: Family Medicine

## 2021-10-23 VITALS — BP 125/84 | HR 80 | Temp 98.0°F | Resp 16 | Wt 287.4 lb

## 2021-10-23 DIAGNOSIS — I10 Essential (primary) hypertension: Secondary | ICD-10-CM | POA: Diagnosis not present

## 2021-10-23 DIAGNOSIS — R739 Hyperglycemia, unspecified: Secondary | ICD-10-CM

## 2021-10-23 DIAGNOSIS — E66813 Obesity, class 3: Secondary | ICD-10-CM

## 2021-10-23 DIAGNOSIS — E785 Hyperlipidemia, unspecified: Secondary | ICD-10-CM

## 2021-10-23 DIAGNOSIS — Z6841 Body Mass Index (BMI) 40.0 and over, adult: Secondary | ICD-10-CM

## 2021-10-23 LAB — POCT GLYCOSYLATED HEMOGLOBIN (HGB A1C): Hemoglobin A1C: 7.4 % — AB (ref 4.0–5.6)

## 2021-10-23 MED ORDER — ATORVASTATIN CALCIUM 80 MG PO TABS: 80.0000 mg | ORAL_TABLET | Freq: Every evening | ORAL | 1 refills | Status: DC

## 2021-10-23 MED ORDER — AMLODIPINE BESYLATE 10 MG PO TABS: ORAL_TABLET | ORAL | 1 refills | Status: DC

## 2021-10-23 NOTE — Progress Notes (Unsigned)
New Patient Office Visit  Subjective    Patient ID: Hannah Beard, female    DOB: [birth year]  Age: 66 y.o. MRN: [id number]  CC:  Chief Complaint  Patient presents with   Follow-up    6 f/u    HPI Hannah Beard presents to establish care   Outpatient Encounter Medications as of 10/23/2021  Medication Sig   acetaminophen (TYLENOL) 650 MG CR tablet Take 1,300 mg by mouth every 8 (eight) hours as needed for pain.   alclomethasone (ACLOVATE) 0.05 % cream Apply topically 2 (two) times daily as needed (Rash).   ascorbic acid (VITAMIN C) 500 MG tablet Take 500 mg by mouth 2 (two) times daily.   Cholecalciferol (VITAMIN D3) 25 MCG (1000 UT) CAPS Take 1,000 Units by mouth daily.    mupirocin ointment (BACTROBAN) 2 % Apply 1 application topically 2 (two) times daily.   traZODone (DESYREL) 50 MG tablet Take 0.5 tablets (25 mg total) by mouth at bedtime as needed for sleep.   triamcinolone cream (KENALOG) 0.1 % Apply 1 application topically 2 (two) times daily.   [DISCONTINUED] amLODipine (NORVASC) 10 MG tablet TAKE 1 TABLET(10 MG) BY MOUTH EVERY EVENING   [DISCONTINUED] atorvastatin (LIPITOR) 80 MG tablet Take 1 tablet (80 mg total) by mouth every evening.   amLODipine (NORVASC) 10 MG tablet TAKE 1 TABLET(10 MG) BY MOUTH EVERY EVENING   atorvastatin (LIPITOR) 80 MG tablet Take 1 tablet (80 mg total) by mouth every evening.   [DISCONTINUED] HYDROcodone-acetaminophen (NORCO/VICODIN) 5-325 MG tablet Take 1 tablet by mouth every 6 (six) hours as needed for moderate pain.   [DISCONTINUED] ibuprofen (ADVIL) 800 MG tablet Take 1 tablet (800 mg total) by mouth every 8 (eight) hours as needed.   No facility-administered encounter medications on file as of 10/23/2021.    Past Medical History:  Diagnosis Date   (HFpEF) heart failure with preserved ejection fraction ([place]) 02/2014   volume excess in setting of STEMI and volume resuscitation (EF preserved) post cardiac cath;  last echo 02-20-2014   ef 55-60%. G2DD   Arthritis    back L3-S1   Atypical mole    right lower back moderate atypia   Bilateral cataracts    MD just watching   Bladder cancer ([place])    CAD (coronary artery disease) previous cardiologist Dr Burt Knack, lov note in epic 04-28-2014  (03-13-2019  per pt has not seen any doctor in approx. 5 yrs ago, but denies any cardiac s&s)   a. 02/19/14 inf STEMI s/p DES to LCx and moderate stenosis midRCA   Eczema    History of pleural empyema    left side  08-30-2009 s/p  left VATS w/ drainage/ decortication   History of ST elevation myocardial infarction (STEMI) 02/19/2014   HLD (hyperlipidemia)    Hypertension    03-13-2019  was on medication 5 yrs ago but none since this time due to not having seen any doctor for 5 yrs due to insurance issue's   Nodular basal cell carcinoma (BCC) 02/18/2020   Left Upper Arm Posterior   Pneumonia    x  1   S/P drug eluting coronary stent placement    02-20-2004   PCI and DES x1 to LCx   Skin cancer of arm    Wears glasses     Past Surgical History:  Procedure Laterality Date   BILIARY DILATION  12/14/2019   Procedure: BILIARY DILATION;  Surgeon: [name]., MD;  Location: [facility] ENDOSCOPY;  Service: Gastroenterology;;  BIOPSY  12/14/2019   Procedure: BIOPSY;  Surgeon: Rush Landmark Telford Nab., MD;  Location: [place];  Service: Gastroenterology;;   BREAST BIOPSY Right 06/01/2019   MILD FIBROCYSTIC CHANGE. FIBROADENOMA   CARDIAC CATHETERIZATION  02/19/2014   left   CHOLECYSTECTOMY N/A 03/03/2020   Procedure: LAPAROSCOPIC CHOLECYSTECTOMY WITH INTRAOPERATIVE CHOLANGIOGRAM;  Surgeon: Kieth Brightly Arta Bruce, MD;  Location: [facility];  Service: General;  Laterality: N/A;   CYSTOSCOPY WITH BIOPSY N/A 04/29/2019   Procedure: CYSTOSCOPY WITH BLADDER BIOPSY/ RIGHT STENT REMOVAL;  Surgeon: Ceasar Mons, MD;  Location[facility];  Service: Urology;  Laterality: N/A;   DILATION AND CURETTAGE OF UTERUS  yrs ago    ENDOSCOPIC RETROGRADE CHOLANGIOPANCREATOGRAPHY (ERCP) WITH PROPOFOL N/A 12/14/2019   Procedure: ENDOSCOPIC RETROGRADE CHOLANGIOPANCREATOGRAPHY (ERCP) WITH PROPOFOL;  Surgeon: Rush Landmark Telford Nab., MD;  Location: [place];  Service: Gastroenterology;  Laterality: N/A;   ESOPHAGOGASTRODUODENOSCOPY (EGD) WITH PROPOFOL N/A 12/14/2019   Procedure: ESOPHAGOGASTRODUODENOSCOPY (EGD) WITH PROPOFOL;  Surgeon: Rush Landmark Telford Nab., MD;  Location: [place];  Service: Gastroenterology;  Laterality: N/A;   LEFT HEART CATH N/A 02/19/2014   Procedure: LEFT HEART CATH;  Surgeon: Blane Ohara, MD;  Location: [facility] CATH LAB;  Service: Cardiovascular;  Laterality: N/A;   REMOVAL OF STONES  12/14/2019   Procedure: REMOVAL OF STONES;  Surgeon: Rush Landmark Telford Nab., MD;  Location: [place];  Service: Gastroenterology;;   skin cancer removal      02/2020 - left upper arm    SPHINCTEROTOMY  12/14/2019   Procedure: SPHINCTEROTOMY;  Surgeon: Mansouraty, Telford Nab., MD;  Location: [place];  Service: Gastroenterology;;   TRANSURETHRAL RESECTION OF BLADDER TUMOR N/A 03/18/2019   Procedure: TRANSURETHRAL RESECTION OF BLADDER TUMOR (TURBT) WITH CYSTOSCOPY RIGHT STENT PLACEMENT/ INTRAVESICAL INSTILLATION OF GEMCITABINE;  Surgeon: Ceasar Mons, MD;  Location: [facility];  Service: Urology;  Laterality: N/A;   TRANSURETHRAL RESECTION OF BLADDER TUMOR N/A 09/23/2019   Procedure: Cystoscopy with TURBT (small) and resection of right ureteral orifice, Right JJ stent placement; Right retrograde pyelogram with intraoperative interpretation of fluoroscopic imaging post operative instillation of gemcitabine;  Surgeon: Ceasar Mons, MD;  [facility]  Service: Urology;  Laterality: N/A;   TUBAL LIGATION Bilateral yrs ago   [place] (VATS)/EMPYEMA Left 08-30-2009   dr hendrickson  '@[facility]'$     Family History  Problem Relation Age of  Onset   Cancer Mother    Diabetes Mother    Heart disease Mother    Hyperlipidemia Mother    Hypertension Mother    Heart disease Maternal Grandmother    Heart failure Maternal Grandmother    Heart disease Maternal Grandfather    Diabetes Maternal Grandfather    Heart attack Maternal Grandfather    Cirrhosis Paternal Grandmother    Diabetes Brother    Heart disease Brother    Hyperlipidemia Brother    Hypertension Brother    Diabetes Brother    Hyperlipidemia Brother    Hypertension Brother    Stroke Neg Hx    Colon cancer Neg Hx    Pancreatic cancer Neg Hx    Stomach cancer Neg Hx    Esophageal cancer Neg Hx    Inflammatory bowel disease Neg Hx    Liver disease Neg Hx    Rectal cancer Neg Hx     Social History   Socioeconomic History   Marital status: Single    Spouse name: Not on file   Number of children: Not on file  Years of education: Not on file   Highest education level: Not on file  Occupational History   Not on file  Tobacco Use   Smoking status: Former    Years: 20.00    Types: Cigarettes    Quit date: 08/28/2009    Years since quitting: 12.1   Smokeless tobacco: Never  Vaping Use   Vaping Use: Never used  Substance and Sexual Activity   Alcohol use: Yes    Alcohol/week: 0.0 standard drinks of alcohol    Comment: rare   Drug use: Never   Sexual activity: Not on file  Other Topics Concern   Not on file  Social History Narrative   ** Merged History Encounter **       Social Determinants of Health   Financial Resource Strain: Not on file  Food Insecurity: Not on file  Transportation Needs: Not on file  Physical Activity: Not on file  Stress: Not on file  Social Connections: Not on file  Intimate Partner Violence: Not on file    ROS      Objective    BP 125/84   Pulse 80   Temp 98 F (36.7 C) (Oral)   Resp 16   Wt 287 lb 6.4 oz (130.4 kg)   LMP  (LMP Unknown)   SpO2 92%   BMI 49.33 kg/m   Physical Exam  {Labs  (Optional):23779}    Assessment & Plan:   Problem List Items Addressed This Visit       Other   HLD (hyperlipidemia)   Relevant Medications   amLODipine (NORVASC) 10 MG tablet   atorvastatin (LIPITOR) 80 MG tablet   Other Visit Diagnoses     Essential hypertension    -  Primary   Relevant Medications   amLODipine (NORVASC) 10 MG tablet   atorvastatin (LIPITOR) 80 MG tablet       No follow-ups on file.   Becky Sax, MD

## 2021-10-24 ENCOUNTER — Encounter: Payer: Self-pay | Admitting: Family Medicine

## 2021-10-24 LAB — CMP14+EGFR
ALT: 57 IU/L — ABNORMAL HIGH (ref 0–32)
AST: 47 IU/L — ABNORMAL HIGH (ref 0–40)
Albumin/Globulin Ratio: 1.3 (ref 1.2–2.2)
Albumin: 4.5 g/dL (ref 3.9–4.9)
Alkaline Phosphatase: 156 IU/L — ABNORMAL HIGH (ref 44–121)
BUN/Creatinine Ratio: 12 (ref 12–28)
BUN: 14 mg/dL (ref 8–27)
Bilirubin Total: 0.3 mg/dL (ref 0.0–1.2)
CO2: 16 mmol/L — ABNORMAL LOW (ref 20–29)
Calcium: 9.9 mg/dL (ref 8.7–10.3)
Chloride: 108 mmol/L — ABNORMAL HIGH (ref 96–106)
Creatinine, Ser: 1.13 mg/dL — ABNORMAL HIGH (ref 0.57–1.00)
Globulin, Total: 3.4 g/dL (ref 1.5–4.5)
Glucose: 170 mg/dL — ABNORMAL HIGH (ref 70–99)
Potassium: 4.7 mmol/L (ref 3.5–5.2)
Sodium: 147 mmol/L — ABNORMAL HIGH (ref 134–144)
Total Protein: 7.9 g/dL (ref 6.0–8.5)
eGFR: 54 mL/min/{1.73_m2} — ABNORMAL LOW (ref >59)

## 2021-12-22 DIAGNOSIS — F419 Anxiety disorder, unspecified: Secondary | ICD-10-CM | POA: Insufficient documentation

## 2022-01-11 ENCOUNTER — Other Ambulatory Visit: Payer: Self-pay | Admitting: Family Medicine

## 2022-01-11 DIAGNOSIS — I10 Essential (primary) hypertension: Secondary | ICD-10-CM

## 2022-04-13 ENCOUNTER — Other Ambulatory Visit: Payer: Self-pay | Admitting: *Deleted

## 2022-04-13 DIAGNOSIS — E785 Hyperlipidemia, unspecified: Secondary | ICD-10-CM

## 2022-04-13 MED ORDER — ATORVASTATIN CALCIUM 80 MG PO TABS: 80.0000 mg | ORAL_TABLET | Freq: Every evening | ORAL | 1 refills | Status: DC

## 2022-04-23 ENCOUNTER — Ambulatory Visit (INDEPENDENT_AMBULATORY_CARE_PROVIDER_SITE_OTHER): Payer: PPO | Admitting: Family Medicine

## 2022-04-23 VITALS — BP 131/75 | HR 85 | Temp 98.1°F | Resp 16 | Ht 64.0 in | Wt 280.6 lb

## 2022-04-23 DIAGNOSIS — Z13820 Encounter for screening for osteoporosis: Secondary | ICD-10-CM

## 2022-04-23 DIAGNOSIS — Z780 Asymptomatic menopausal state: Secondary | ICD-10-CM

## 2022-04-23 DIAGNOSIS — Z0001 Encounter for general adult medical examination with abnormal findings: Secondary | ICD-10-CM | POA: Diagnosis not present

## 2022-04-23 DIAGNOSIS — Z6841 Body Mass Index (BMI) 40.0 and over, adult: Secondary | ICD-10-CM

## 2022-04-23 DIAGNOSIS — E785 Hyperlipidemia, unspecified: Secondary | ICD-10-CM

## 2022-04-23 DIAGNOSIS — I10 Essential (primary) hypertension: Secondary | ICD-10-CM | POA: Diagnosis not present

## 2022-04-23 DIAGNOSIS — Z0000 Encounter for general adult medical examination without abnormal findings: Secondary | ICD-10-CM

## 2022-04-23 DIAGNOSIS — R739 Hyperglycemia, unspecified: Secondary | ICD-10-CM

## 2022-04-23 NOTE — Progress Notes (Unsigned)
Annual Wellness Visit     Patient: Hannah Beard, Female    DOB: September 03, 1955, 67 y.o.   MRN: [id number]  Subjective  Chief Complaint  Patient presents with   Annual Exam   Medicare Wellness    Hannah Beard is a 67 y.o. female who presents today for her Annual Wellness Visit. She reports consuming a {diet types:17450} diet. {Exercise:19826} She generally feels {well/fairly well/poorly:18703}. She reports sleeping {well/fairly well/poorly:18703}. She {does/does not:200015} have additional problems to discuss today.   HPI  {VISON DENTAL STD PSA (Optional):27386}   {History (Optional):23778}  Medications: Outpatient Medications Prior to Visit  Medication Sig   acetaminophen (TYLENOL) 650 MG CR tablet Take 1,300 mg by mouth every 8 (eight) hours as needed for pain.   alclomethasone (ACLOVATE) 0.05 % cream Apply topically 2 (two) times daily as needed (Rash).   amLODipine (NORVASC) 10 MG tablet TAKE 1 TABLET(10 MG) BY MOUTH EVERY EVENING   ascorbic acid (VITAMIN C) 500 MG tablet Take 500 mg by mouth 2 (two) times daily.   atorvastatin (LIPITOR) 80 MG tablet Take 1 tablet (80 mg total) by mouth every evening.   Cholecalciferol (VITAMIN D3) 25 MCG (1000 UT) CAPS Take 1,000 Units by mouth daily.    diazepam (VALIUM) 10 MG tablet SMARTSIG:1 Tablet(s) By Mouth   mupirocin ointment (BACTROBAN) 2 % Apply 1 application topically 2 (two) times daily.   traZODone (DESYREL) 50 MG tablet Take 0.5 tablets (25 mg total) by mouth at bedtime as needed for sleep.   triamcinolone cream (KENALOG) 0.1 % Apply 1 application topically 2 (two) times daily.   No facility-administered medications prior to visit.    No Known Allergies  Patient Care Team: Georganna Skeans, MD as PCP - General (Family Medicine) Patient, No Pcp Per (General Practice) Sheffield, Judye Bos, PA-C as Physician Assistant (Dermatology)  ROS      Objective  BP 131/75   Pulse 85   Temp 98.1 F (36.7 C) (Oral)   Resp  16   Ht 5\' 4"  (1.626 m)   Wt 280 lb 9.6 oz (127.3 kg)   LMP  (LMP Unknown)   SpO2 92%   BMI 48.16 kg/m  {Vitals History (Optional):23777}  Physical Exam    Most recent functional status assessment:     No data to display         Most recent fall risk assessment:    10/23/2021    9:05 AM  Fall Risk   Falls in the past year? 0  Number falls in past yr: 0  Injury with Fall? 0    Most recent depression screenings:    04/23/2022   10:12 AM 10/23/2021    8:08 AM  PHQ 2/9 Scores  PHQ - 2 Score 0 0  PHQ- 9 Score 0 0   Most recent cognitive screening:     No data to display         Most recent Audit-C alcohol use screening     No data to display         A score of 3 or more in women, and 4 or more in men indicates increased risk for alcohol abuse, EXCEPT if all of the points are from question 1   Vision/Hearing Screen: No results found.  {Labs (Optional):23779}  No results found for any visits on 04/23/22.    Assessment & Plan   Annual wellness visit done today including the all of the following: Reviewed patient's Family Medical History Reviewed and updated  list of patient's medical providers Assessment of cognitive impairment was done Assessed patient's functional ability Established a written schedule for health screening services Health Risk Assessent Completed and Reviewed  Exercise Activities and Dietary recommendations  Goals   None     Immunization History  Administered Date(s) Administered   Influenza, High Dose Seasonal PF 10/16/2021   Influenza,inj,Quad PF,6+ Mos 11/16/2019   Influenza,inj,Quad PF,6-35 Mos 10/03/2020   Janssen (J&J) SARS-COV-2 Vaccination 05/19/2019   Moderna Covid-19 Vaccine Bivalent Booster 63yrs & up 10/03/2020, 04/19/2021, 10/16/2021   Moderna SARS-COV2 Booster Vaccination 11/14/2019   Tdap 11/16/2019   Zoster Recombinat (Shingrix) 11/16/2019, 03/10/2020    Health Maintenance  Topic Date Due   COVID-19  Vaccine (5 - 2023-24 season) 05/09/2022 (Originally 12/11/2021)   DEXA SCAN  01/12/2023 (Originally 05/11/2021)   MAMMOGRAM  06/02/2022   INFLUENZA VACCINE  08/16/2022   COLONOSCOPY (Pts 45-73yrs Insurance coverage will need to be confirmed)  12/29/2022   Medicare Annual Wellness (AWV)  04/23/2023   DTaP/Tdap/Td (2 - Td or Tdap) 11/15/2029   Hepatitis C Screening  Completed   HIV Screening  Completed   Zoster Vaccines- Shingrix  Completed   HPV VACCINES  Aged Out   Pneumonia Vaccine 62+ Years old  Discontinued   PAP SMEAR-Modifier  Discontinued     Discussed health benefits of physical activity, and encouraged her to engage in regular exercise appropriate for her age and condition.    Problem List Items Addressed This Visit       Other   HLD (hyperlipidemia)   Relevant Orders   Lipid Panel   AST   ALT   Other Visit Diagnoses     Encounter for Medicare annual wellness exam    -  Primary   Essential hypertension       Relevant Orders   Basic Metabolic Panel   Hyperglycemia       Relevant Orders   HgB A1c   Encounter for osteoporosis screening in asymptomatic postmenopausal patient       Relevant Orders   DG Bone Density   Class 3 severe obesity due to excess calories with serious comorbidity and body mass index (BMI) of 45.0 to 49.9 in adult           No follow-ups on file.     Kieth Brightly, RMA

## 2022-04-24 ENCOUNTER — Encounter: Payer: Self-pay | Admitting: Family Medicine

## 2022-04-24 NOTE — Progress Notes (Signed)
Established Patient Office Visit  Subjective    Patient ID: Hannah Beard, female    DOB: 1955-04-25  Age: 67 y.o. MRN: [id number]  CC:  Chief Complaint  Patient presents with   Annual Exam   Medicare Wellness    HPI Hannah Beard presents for review of chronic med issues. Patient denies acute complaints or concerns.    Outpatient Encounter Medications as of 04/23/2022  Medication Sig   acetaminophen (TYLENOL) 650 MG CR tablet Take 1,300 mg by mouth every 8 (eight) hours as needed for pain.   alclomethasone (ACLOVATE) 0.05 % cream Apply topically 2 (two) times daily as needed (Rash).   amLODipine (NORVASC) 10 MG tablet TAKE 1 TABLET(10 MG) BY MOUTH EVERY EVENING   ascorbic acid (VITAMIN C) 500 MG tablet Take 500 mg by mouth 2 (two) times daily.   atorvastatin (LIPITOR) 80 MG tablet Take 1 tablet (80 mg total) by mouth every evening.   Cholecalciferol (VITAMIN D3) 25 MCG (1000 UT) CAPS Take 1,000 Units by mouth daily.    diazepam (VALIUM) 10 MG tablet SMARTSIG:1 Tablet(s) By Mouth   mupirocin ointment (BACTROBAN) 2 % Apply 1 application topically 2 (two) times daily.   traZODone (DESYREL) 50 MG tablet Take 0.5 tablets (25 mg total) by mouth at bedtime as needed for sleep.   triamcinolone cream (KENALOG) 0.1 % Apply 1 application topically 2 (two) times daily.   No facility-administered encounter medications on file as of 04/23/2022.    Past Medical History:  Diagnosis Date   (HFpEF) heart failure with preserved ejection fraction ([facility]) 02/2014   volume excess in setting of STEMI and volume resuscitation (EF preserved) post cardiac cath;  last echo 02-20-2014  ef 55-60%. G2DD   Arthritis    back L3-S1   Atypical mole    right lower back moderate atypia   Bilateral cataracts    MD just watching   Bladder cancer ([facility])    CAD (coronary artery disease) previous cardiologist Dr Excell Seltzer, lov note in epic 04-28-2014  (03-13-2019  per pt has not seen any doctor in approx. 5 yrs ago,  but denies any cardiac s&s)   a. 02/19/14 inf STEMI s/p DES to LCx and moderate stenosis midRCA   Eczema    History of pleural empyema    left side  08-30-2009 s/p  left VATS w/ drainage/ decortication   History of ST elevation myocardial infarction (STEMI) 02/19/2014   HLD (hyperlipidemia)    Hypertension    03-13-2019  was on medication 5 yrs ago but none since this time due to not having seen any doctor for 5 yrs due to insurance issue's   Nodular basal cell carcinoma (BCC) 02/18/2020   Left Upper Arm Posterior   Pneumonia    x  1   S/P drug eluting coronary stent placement    02-20-2004   PCI and DES x1 to LCx   Skin cancer of arm    Wears glasses     Past Surgical History:  Procedure Laterality Date   BILIARY DILATION  12/14/2019   Procedure: BILIARY DILATION;  Surgeon: Lemar Lofty., MD;  Location: [facility] ENDOSCOPY;  Service: Gastroenterology;;   BIOPSY  12/14/2019   Procedure: BIOPSY;  Surgeon: Lemar Lofty., MD;  Location: [facility] ENDOSCOPY;  Service: Gastroenterology;;   BREAST BIOPSY Right 06/01/2019   MILD FIBROCYSTIC CHANGE. FIBROADENOMA   CARDIAC CATHETERIZATION  02/19/2014   left   CHOLECYSTECTOMY N/A 03/03/2020   Procedure: LAPAROSCOPIC CHOLECYSTECTOMY WITH INTRAOPERATIVE CHOLANGIOGRAM;  Surgeon:  Kinsinger, De Blanch, MD;  Location: [facility];  Service: General;  Laterality: N/A;   CYSTOSCOPY WITH BIOPSY N/A 04/29/2019   Procedure: CYSTOSCOPY WITH BLADDER BIOPSY/ RIGHT STENT REMOVAL;  Surgeon: Rene Paci, MD;  Location: [facility];  Service: Urology;  Laterality: N/A;   DILATION AND CURETTAGE OF UTERUS  yrs ago   ENDOSCOPIC RETROGRADE CHOLANGIOPANCREATOGRAPHY (ERCP) WITH PROPOFOL N/A 12/14/2019   Procedure: ENDOSCOPIC RETROGRADE CHOLANGIOPANCREATOGRAPHY (ERCP) WITH PROPOFOL;  Surgeon: Meridee Score Netty Starring., MD;  Location: [facility] ENDOSCOPY;  Service: Gastroenterology;  Laterality: N/A;   ESOPHAGOGASTRODUODENOSCOPY (EGD) WITH PROPOFOL  N/A 12/14/2019   Procedure: ESOPHAGOGASTRODUODENOSCOPY (EGD) WITH PROPOFOL;  Surgeon: Meridee Score Netty Starring., MD;  Location: [facility] ENDOSCOPY;  Service: Gastroenterology;  Laterality: N/A;   LEFT HEART CATH N/A 02/19/2014   Procedure: LEFT HEART CATH;  Surgeon: Micheline Chapman, MD;  Location: [facility] CATH LAB;  Service: Cardiovascular;  Laterality: N/A;   REMOVAL OF STONES  12/14/2019   Procedure: REMOVAL OF STONES;  Surgeon: Meridee Score Netty Starring., MD;  Location: [facility] ENDOSCOPY;  Service: Gastroenterology;;   skin cancer removal      02/2020 - left upper arm    SPHINCTEROTOMY  12/14/2019   Procedure: SPHINCTEROTOMY;  Surgeon: Mansouraty, Netty Starring., MD;  Location: [facility] ENDOSCOPY;  Service: Gastroenterology;;   TRANSURETHRAL RESECTION OF BLADDER TUMOR N/A 03/18/2019   Procedure: TRANSURETHRAL RESECTION OF BLADDER TUMOR (TURBT) WITH CYSTOSCOPY RIGHT STENT PLACEMENT/ INTRAVESICAL INSTILLATION OF GEMCITABINE;  Surgeon: Rene Paci, MD;  Location[facility];  Service: Urology;  Laterality: N/A;   TRANSURETHRAL RESECTION OF BLADDER TUMOR N/A 09/23/2019   Procedure: Cystoscopy with TURBT (small) and resection of right ureteral orifice, Right JJ stent placement; Right retrograde pyelogram with intraoperative interpretation of fluoroscopic imaging post operative instillation of gemcitabine;  Surgeon: Rene Paci, MD;  Location: [facility];  Service: Urology;  Laterality: N/A;   TUBAL LIGATION Bilateral yrs ago   VIDEO ASSISTED THORACOSCOPY (VATS)/EMPYEMA Left 08-30-2009   dr hendrickson  @[facility]     Family History  Problem Relation Age of Onset   Cancer Mother    Diabetes Mother    Heart disease Mother    Hyperlipidemia Mother    Hypertension Mother    Heart disease Maternal Grandmother    Heart failure Maternal Grandmother    Heart disease Maternal Grandfather    Diabetes Maternal Grandfather    Heart attack Maternal Grandfather    Cirrhosis Paternal  Grandmother    Diabetes Brother    Heart disease Brother    Hyperlipidemia Brother    Hypertension Brother    Diabetes Brother    Hyperlipidemia Brother    Hypertension Brother    Stroke Neg Hx    Colon cancer Neg Hx    Pancreatic cancer Neg Hx    Stomach cancer Neg Hx    Esophageal cancer Neg Hx    Inflammatory bowel disease Neg Hx    Liver disease Neg Hx    Rectal cancer Neg Hx     Social History   Socioeconomic History   Marital status: Single    Spouse name: Not on file   Number of children: Not on file   Years of education: Not on file   Highest education level: Not on file  Occupational History   Not on file  Tobacco Use   Smoking status: Former    Years: 20    Types: Cigarettes    Quit date: 08/28/2009    Years since quitting: 12.6  Smokeless tobacco: Never  Vaping Use   Vaping Use: Never used  Substance and Sexual Activity   Alcohol use: Yes    Alcohol/week: 0.0 standard drinks of alcohol    Comment: rare   Drug use: Never   Sexual activity: Not on file  Other Topics Concern   Not on file  Social History Narrative   ** Merged History Encounter **       Social Determinants of Health   Financial Resource Strain: Not on file  Food Insecurity: Not on file  Transportation Needs: Not on file  Physical Activity: Not on file  Stress: Not on file  Social Connections: Not on file  Intimate Partner Violence: Not on file    Review of Systems  All other systems reviewed and are negative.       Objective    BP 131/75   Pulse 85   Temp 98.1 F (36.7 C) (Oral)   Resp 16   Ht 5\' 4"  (1.626 m)   Wt 280 lb 9.6 oz (127.3 kg)   LMP  (LMP Unknown)   SpO2 92%   BMI 48.16 kg/m   Physical Exam Vitals and nursing note reviewed.  Constitutional:      General: She is not in acute distress.    Appearance: She is obese.  Cardiovascular:     Rate and Rhythm: Normal rate and regular rhythm.  Pulmonary:     Effort: Pulmonary effort is normal.     Breath  sounds: Normal breath sounds.  Abdominal:     Palpations: Abdomen is soft.     Tenderness: There is no abdominal tenderness.  Neurological:     General: No focal deficit present.     Mental Status: She is alert and oriented to person, place, and time.         Assessment & Plan:   1. Encounter for Medicare annual wellness exam   2. Essential hypertension  - Basic Metabolic Panel  3. Hyperlipidemia, unspecified hyperlipidemia type  - Lipid Panel - AST - ALT  4. Hyperglycemia  - HgB A1c  5. Encounter for osteoporosis screening in asymptomatic postmenopausal patient  - DG Bone Density; Future  6. Class 3 severe obesity due to excess calories with serious comorbidity and body mass index (BMI) of 45.0 to 49.9 in adult     Return in about 6 months (around 10/23/2022) for follow up.   Tommie RaymondWilson, Damarion Mendizabal P, MD

## 2022-04-26 LAB — ALT

## 2022-04-26 LAB — BASIC METABOLIC PANEL

## 2022-04-26 LAB — LIPID PANEL

## 2022-04-26 LAB — AST

## 2022-05-22 ENCOUNTER — Ambulatory Visit: Payer: PPO | Admitting: Physician Assistant

## 2022-07-07 ENCOUNTER — Other Ambulatory Visit: Payer: Self-pay | Admitting: Family Medicine

## 2022-07-07 DIAGNOSIS — I10 Essential (primary) hypertension: Secondary | ICD-10-CM

## 2022-07-09 NOTE — Telephone Encounter (Signed)
Requested Prescriptions  Pending Prescriptions Disp Refills   amLODipine (NORVASC) 10 MG tablet [Pharmacy Med Name: AMLODIPINE BESYLATE 10MG  TABLETS] 90 tablet 1    Sig: TAKE 1 TABLET(10 MG) BY MOUTH EVERY EVENING     Cardiovascular: Calcium Channel Blockers 2 Passed - 07/07/2022  3:17 AM      Passed - Last BP in normal range    BP Readings from Last 1 Encounters:  04/23/22 131/75         Passed - Last Heart Rate in normal range    Pulse Readings from Last 1 Encounters:  04/23/22 85         Passed - Valid encounter within last 6 months    Recent Outpatient Visits           2 months ago Encounter for [employer] annual wellness exam   [facility], MD   8 months ago Essential hypertension   [facility], MD   1 year ago Encounter for annual physical exam   [facility], MD   2 years ago Encounter for annual physical exam   [facility] [facility], Kandee Keen, MD   2 years ago Essential hypertension   [facility], Kandee Keen, MD       Future Appointments             In 3 months Georganna Skeans, MD [facility]

## 2022-10-08 ENCOUNTER — Other Ambulatory Visit: Payer: Self-pay | Admitting: *Deleted

## 2022-10-08 DIAGNOSIS — E785 Hyperlipidemia, unspecified: Secondary | ICD-10-CM

## 2022-10-08 MED ORDER — ATORVASTATIN CALCIUM 80 MG PO TABS: 80.0000 mg | ORAL_TABLET | Freq: Every evening | ORAL | 1 refills | Status: DC

## 2022-10-23 ENCOUNTER — Encounter: Payer: Self-pay | Admitting: Family Medicine

## 2022-10-23 ENCOUNTER — Ambulatory Visit (INDEPENDENT_AMBULATORY_CARE_PROVIDER_SITE_OTHER): Payer: PPO | Admitting: Family Medicine

## 2022-10-23 VITALS — BP 135/80 | HR 92 | Temp 98.4°F | Resp 18 | Ht 64.0 in | Wt 292.4 lb

## 2022-10-23 DIAGNOSIS — Z6841 Body Mass Index (BMI) 40.0 and over, adult: Secondary | ICD-10-CM

## 2022-10-23 DIAGNOSIS — Z780 Asymptomatic menopausal state: Secondary | ICD-10-CM

## 2022-10-23 DIAGNOSIS — E1169 Type 2 diabetes mellitus with other specified complication: Secondary | ICD-10-CM

## 2022-10-23 DIAGNOSIS — E66813 Obesity, class 3: Secondary | ICD-10-CM

## 2022-10-23 DIAGNOSIS — I10 Essential (primary) hypertension: Secondary | ICD-10-CM

## 2022-10-23 DIAGNOSIS — Z13820 Encounter for screening for osteoporosis: Secondary | ICD-10-CM

## 2022-10-23 NOTE — Progress Notes (Signed)
Established Patient Office Visit  Subjective    Patient ID: Hannah Beard, female    DOB: [birth year]  Age: 67 y.o. MRN: [id number]  CC:  Chief Complaint  Patient presents with   Follow-up    6 month follow up    HPI [name] presents for follow up of chronic med issues. Patient denies acute complaints.   Outpatient Encounter Medications as of 10/23/2022  Medication Sig   acetaminophen (TYLENOL) 650 MG CR tablet Take 1,300 mg by mouth every 8 (eight) hours as needed for pain.   alclomethasone (ACLOVATE) 0.05 % cream Apply topically 2 (two) times daily as needed (Rash).   amLODipine (NORVASC) 10 MG tablet TAKE 1 TABLET(10 MG) BY MOUTH EVERY EVENING   ascorbic acid (VITAMIN C) 500 MG tablet Take 500 mg by mouth 2 (two) times daily.   atorvastatin (LIPITOR) 80 MG tablet Take 1 tablet (80 mg total) by mouth every evening.   Cholecalciferol (VITAMIN D3) 25 MCG (1000 UT) CAPS Take 1,000 Units by mouth daily.    diazepam (VALIUM) 10 MG tablet SMARTSIG:1 Tablet(s) By Mouth   mupirocin ointment (BACTROBAN) 2 % Apply 1 application topically 2 (two) times daily.   traZODone (DESYREL) 50 MG tablet Take 0.5 tablets (25 mg total) by mouth at bedtime as needed for sleep.   triamcinolone cream (KENALOG) 0.1 % Apply 1 application topically 2 (two) times daily.   No facility-administered encounter medications on file as of 10/23/2022.    Past Medical History:  Diagnosis Date   (HFpEF) heart failure with preserved ejection fraction (HCC) 02/2014   volume excess in setting of STEMI and volume resuscitation (EF preserved) post cardiac cath;  last echo 02-20-2014  ef 55-60%. G2DD   Arthritis    back L3-S1   Atypical mole    right lower back moderate atypia   Bilateral cataracts    MD just watching   Bladder cancer (HCC)    CAD (coronary artery disease) previous cardiologist Dr Excell Seltzer, lov note in epic 04-28-2014  (03-13-2019  per pt has not seen any doctor in approx. 5 yrs ago, but  denies any cardiac s&s)   a. 02/19/14 inf STEMI s/p DES to LCx and moderate stenosis midRCA   Eczema    History of pleural empyema    left side  08-30-2009 s/p  left VATS w/ drainage/ decortication   History of ST elevation myocardial infarction (STEMI) 02/19/2014   HLD (hyperlipidemia)    Hypertension    03-13-2019  was on medication 5 yrs ago but none since this time due to not having seen any doctor for 5 yrs due to insurance issue's   Nodular basal cell carcinoma (BCC) 02/18/2020   Left Upper Arm Posterior   Pneumonia    x  1   S/P drug eluting coronary stent placement    02-20-2004   PCI and DES x1 to LCx   Skin cancer of arm    Wears glasses     Past Surgical History:  Procedure Laterality Date   BILIARY DILATION  12/14/2019   Procedure: BILIARY DILATION;  Surgeon: Lemar Lofty., MD;  Location[facility] ENDOSCOPY;  Service: Gastroenterology;;   BIOPSY  12/14/2019   Procedure: BIOPSY;  Surgeon: Lemar Lofty., MD;  Location: [facility] ENDOSCOPY;  Service: Gastroenterology;;   BREAST BIOPSY Right 06/01/2019   MILD FIBROCYSTIC CHANGE. FIBROADENOMA   CARDIAC CATHETERIZATION  02/19/2014   left   CHOLECYSTECTOMY N/A 03/03/2020   Procedure: LAPAROSCOPIC CHOLECYSTECTOMY WITH INTRAOPERATIVE CHOLANGIOGRAM;  Surgeon:  Kinsinger, De Blanch, MD;  Location: [facility];  Service: General;  Laterality: N/A;   CYSTOSCOPY WITH BIOPSY N/A 04/29/2019   Procedure: CYSTOSCOPY WITH BLADDER BIOPSY/ RIGHT STENT REMOVAL;  Surgeon: Rene Paci, MD;  Location: [facility];  Service: Urology;  Laterality: N/A;   DILATION AND CURETTAGE OF UTERUS  yrs ago   ENDOSCOPIC RETROGRADE CHOLANGIOPANCREATOGRAPHY (ERCP) WITH PROPOFOL N/A 12/14/2019   Procedure: ENDOSCOPIC RETROGRADE CHOLANGIOPANCREATOGRAPHY (ERCP) WITH PROPOFOL;  Surgeon: Meridee Score Netty Starring., MD;  Location: [facility] ENDOSCOPY;  Service: Gastroenterology;  Laterality: N/A;   ESOPHAGOGASTRODUODENOSCOPY (EGD) WITH PROPOFOL N/A  12/14/2019   Procedure: ESOPHAGOGASTRODUODENOSCOPY (EGD) WITH PROPOFOL;  Surgeon: Meridee Score Netty Starring., MD;  Location: [facility] ENDOSCOPY;  Service: Gastroenterology;  Laterality: N/A;   LEFT HEART CATH N/A 02/19/2014   Procedure: LEFT HEART CATH;  Surgeon: Micheline Chapman, MD;  Location: [facility] CATH LAB;  Service: Cardiovascular;  Laterality: N/A;   REMOVAL OF STONES  12/14/2019   Procedure: REMOVAL OF STONES;  Surgeon: Meridee Score Netty Starring., MD;  Location: [facility] ENDOSCOPY;  Service: Gastroenterology;;   skin cancer removal      02/2020 - left upper arm    SPHINCTEROTOMY  12/14/2019   Procedure: SPHINCTEROTOMY;  Surgeon: Mansouraty, Netty Starring., MD;  Location: [facility] ENDOSCOPY;  Service: Gastroenterology;;   TRANSURETHRAL RESECTION OF BLADDER TUMOR N/A 03/18/2019   Procedure: TRANSURETHRAL RESECTION OF BLADDER TUMOR (TURBT) WITH CYSTOSCOPY RIGHT STENT PLACEMENT/ INTRAVESICAL INSTILLATION OF GEMCITABINE;  Surgeon: Rene Paci, MD;  Location: [facility];  Service: Urology;  Laterality: N/A;   TRANSURETHRAL RESECTION OF BLADDER TUMOR N/A 09/23/2019   Procedure: Cystoscopy with TURBT (small) and resection of right ureteral orifice, Right JJ stent placement; Right retrograde pyelogram with intraoperative interpretation of fluoroscopic imaging post operative instillation of gemcitabine;  Surgeon: Rene Paci, MD;  Location: [facility];  Service: Urology;  Laterality: N/A;   TUBAL LIGATION Bilateral yrs ago   VIDEO ASSISTED THORACOSCOPY (VATS)/EMPYEMA Left 08-30-2009   dr hendrickson  @[facility]     Family History  Problem Relation Age of Onset   Cancer Mother    Diabetes Mother    Heart disease Mother    Hyperlipidemia Mother    Hypertension Mother    Heart disease Maternal Grandmother    Heart failure Maternal Grandmother    Heart disease Maternal Grandfather    Diabetes Maternal Grandfather    Heart attack Maternal Grandfather    Cirrhosis Paternal  Grandmother    Diabetes Brother    Heart disease Brother    Hyperlipidemia Brother    Hypertension Brother    Diabetes Brother    Hyperlipidemia Brother    Hypertension Brother    Stroke Neg Hx    Colon cancer Neg Hx    Pancreatic cancer Neg Hx    Stomach cancer Neg Hx    Esophageal cancer Neg Hx    Inflammatory bowel disease Neg Hx    Liver disease Neg Hx    Rectal cancer Neg Hx     Social History   Socioeconomic History   Marital status: Single    Spouse name: Not on file   Number of children: Not on file   Years of education: Not on file   Highest education level: 12th grade  Occupational History   Not on file  Tobacco Use   Smoking status: Former    Current packs/day: 0.00    Types: Cigarettes    Start date: 08/28/1989    Quit date: 08/28/2009  Years since quitting: 13.1   Smokeless tobacco: Never  Vaping Use   Vaping status: Never Used  Substance and Sexual Activity   Alcohol use: Yes    Alcohol/week: 0.0 standard drinks of alcohol    Comment: rare   Drug use: Never   Sexual activity: Not on file  Other Topics Concern   Not on file  Social History Narrative   ** Merged History Encounter **       Social Determinants of Health   Financial Resource Strain: Medium Risk (10/20/2022)   Overall Financial Resource Strain (CARDIA)    Difficulty of Paying Living Expenses: Somewhat hard  Food Insecurity: No Food Insecurity (10/20/2022)   Hunger Vital Sign    Worried About Running Out of Food in the Last Year: Never true    Ran Out of Food in the Last Year: Never true  Transportation Needs: Unmet Transportation Needs (10/20/2022)   PRAPARE - Transportation    Lack of Transportation (Medical): No    Lack of Transportation (Non-Medical): Yes  Physical Activity: Unknown (10/20/2022)   Exercise Vital Sign    Days of Exercise per Week: 0 days    Minutes of Exercise per Session: Not on file  Stress: No Stress Concern Present (10/23/2022)   [employer] of  Occupational Health - Occupational Stress Questionnaire    Feeling of Stress : Not at all  Social Connections: Moderately Isolated (10/20/2022)   Social Connection and Isolation Panel [NHANES]    Frequency of Communication with Friends and Family: More than three times a week    Frequency of Social Gatherings with Friends and Family: More than three times a week    Attends Religious Services: 1 to 4 times per year    Active Member of [employer] or Organizations: No    Attends Banker Meetings: Not on file    Marital Status: Divorced  Intimate Partner Violence: Not At Risk (10/23/2022)   Humiliation, Afraid, Rape, and Kick questionnaire    Fear of Current or Ex-Partner: No    Emotionally Abused: No    Physically Abused: No    Sexually Abused: No    Review of Systems  All other systems reviewed and are negative.       Objective    BP 135/80 (BP Location: Right Arm, Patient Position: Sitting, Cuff Size: Large)   Pulse 92   Temp 98.4 F (36.9 C) (Oral)   Resp 18   Ht 5\' 4"  (1.626 m)   Wt 292 lb 6.4 oz (132.6 kg)   LMP  (LMP Unknown)   SpO2 95%   BMI 50.19 kg/m   Physical Exam Vitals and nursing note reviewed.  Constitutional:      General: She is not in acute distress.    Appearance: She is obese.  Cardiovascular:     Rate and Rhythm: Normal rate and regular rhythm.  Pulmonary:     Effort: Pulmonary effort is normal.     Breath sounds: Normal breath sounds.  Abdominal:     Palpations: Abdomen is soft.     Tenderness: There is no abdominal tenderness.  Neurological:     General: No focal deficit present.     Mental Status: She is alert and oriented to person, place, and time.         Assessment & Plan:   Essential hypertension  Type 2 diabetes mellitus with other specified complication, without long-term current use of insulin (HCC) -     POCT glycosylated hemoglobin (Hb  A1C)  Class 3 severe obesity due to excess calories with serious comorbidity  and body mass index (BMI) of 45.0 to 49.9 in adult [facility])  Encounter for osteoporosis screening in asymptomatic postmenopausal patient -     DG Bone Density     No follow-ups on file.   Tommie Raymond, MD

## 2022-10-24 LAB — HEMOGLOBIN A1C
Est. average glucose Bld gHb Est-mCnc: 189 mg/dL
Hgb A1c MFr Bld: 8.2 % — ABNORMAL HIGH (ref 4.8–5.6)

## 2022-11-26 ENCOUNTER — Encounter: Payer: PPO | Admitting: Family Medicine

## 2022-12-24 ENCOUNTER — Encounter: Payer: Self-pay | Admitting: Gastroenterology

## 2023-03-18 DIAGNOSIS — Z8551 Personal history of malignant neoplasm of bladder: Secondary | ICD-10-CM | POA: Diagnosis not present

## 2023-03-18 DIAGNOSIS — N390 Urinary tract infection, site not specified: Secondary | ICD-10-CM | POA: Diagnosis not present

## 2023-03-19 ENCOUNTER — Telehealth: Payer: Self-pay | Admitting: Family Medicine

## 2023-03-19 NOTE — Telephone Encounter (Signed)
 Hannah Beard

## 2023-04-23 ENCOUNTER — Encounter: Payer: Self-pay | Admitting: Family Medicine

## 2023-04-23 ENCOUNTER — Ambulatory Visit: Payer: PPO | Admitting: Family Medicine

## 2023-04-23 VITALS — BP 131/84 | HR 89 | Wt 283.4 lb

## 2023-04-23 DIAGNOSIS — Z6841 Body Mass Index (BMI) 40.0 and over, adult: Secondary | ICD-10-CM | POA: Diagnosis not present

## 2023-04-23 DIAGNOSIS — E1169 Type 2 diabetes mellitus with other specified complication: Secondary | ICD-10-CM

## 2023-04-23 DIAGNOSIS — E66813 Obesity, class 3: Secondary | ICD-10-CM | POA: Diagnosis not present

## 2023-04-23 DIAGNOSIS — E785 Hyperlipidemia, unspecified: Secondary | ICD-10-CM | POA: Diagnosis not present

## 2023-04-23 DIAGNOSIS — I10 Essential (primary) hypertension: Secondary | ICD-10-CM

## 2023-04-23 LAB — POCT GLYCOSYLATED HEMOGLOBIN (HGB A1C): HbA1c, POC (controlled diabetic range): 8 % — AB (ref 0.0–7.0)

## 2023-04-23 NOTE — Progress Notes (Unsigned)
 Established Patient Office Visit  Subjective    Patient ID: Hannah Beard, female    DOB: 07-Jan-1956  Age: 68 y.o. MRN: [id number]  CC:  Chief Complaint  Patient presents with   Medical Management of Chronic Issues    HPI Jamesetta Greenhalgh presents for routine follow up of chronic med issues include diabetes and hypertension. Patient denies acute complaints.   Outpatient Encounter Medications as of 04/23/2023  Medication Sig   acetaminophen (TYLENOL) 650 MG CR tablet Take 1,300 mg by mouth every 8 (eight) hours as needed for pain.   alclomethasone (ACLOVATE) 0.05 % cream Apply topically 2 (two) times daily as needed (Rash).   amLODipine (NORVASC) 10 MG tablet TAKE 1 TABLET(10 MG) BY MOUTH EVERY EVENING   ascorbic acid (VITAMIN C) 500 MG tablet Take 500 mg by mouth 2 (two) times daily.   atorvastatin (LIPITOR) 80 MG tablet Take 1 tablet (80 mg total) by mouth every evening.   Cholecalciferol (VITAMIN D3) 25 MCG (1000 UT) CAPS Take 1,000 Units by mouth daily.    mupirocin ointment (BACTROBAN) 2 % Apply 1 application topically 2 (two) times daily.   traZODone (DESYREL) 50 MG tablet Take 0.5 tablets (25 mg total) by mouth at bedtime as needed for sleep.   triamcinolone cream (KENALOG) 0.1 % Apply 1 application topically 2 (two) times daily.   diazepam (VALIUM) 10 MG tablet SMARTSIG:1 Tablet(s) By Mouth   No facility-administered encounter medications on file as of 04/23/2023.    Past Medical History:  Diagnosis Date   (HFpEF) heart failure with preserved ejection fraction (HCC) 02/2014   volume excess in setting of STEMI and volume resuscitation (EF preserved) post cardiac cath;  last echo 02-20-2014  ef 55-60%. G2DD   Arthritis    back L3-S1   Atypical mole    right lower back moderate atypia   Bilateral cataracts    MD just watching   Bladder cancer (HCC)    CAD (coronary artery disease) previous cardiologist Dr Excell Seltzer, lov note in epic 04-28-2014  (03-13-2019  per pt has not  seen any doctor in approx. 5 yrs ago, but denies any cardiac s&s)   a. 02/19/14 inf STEMI s/p DES to LCx and moderate stenosis midRCA   Eczema    History of pleural empyema    left side  08-30-2009 s/p  left VATS w/ drainage/ decortication   History of ST elevation myocardial infarction (STEMI) 02/19/2014   HLD (hyperlipidemia)    Hypertension    03-13-2019  was on medication 5 yrs ago but none since this time due to not having seen any doctor for 5 yrs due to insurance issue's   Nodular basal cell carcinoma (BCC) 02/18/2020   Left Upper Arm Posterior   Pneumonia    x  1   S/P drug eluting coronary stent placement    02-20-2004   PCI and DES x1 to LCx   Skin cancer of arm    Wears glasses     Past Surgical History:  Procedure Laterality Date   BILIARY DILATION  12/14/2019   Procedure: BILIARY DILATION;  Surgeon: Lemar Lofty., MD;  Location: [facility] ENDOSCOPY;  Service: Gastroenterology;;   BIOPSY  12/14/2019   Procedure: BIOPSY;  Surgeon: Lemar Lofty., MD;  Location: [facility] ENDOSCOPY;  Service: Gastroenterology;;   BREAST BIOPSY Right 06/01/2019   MILD FIBROCYSTIC CHANGE. FIBROADENOMA   CARDIAC CATHETERIZATION  02/19/2014   left   CHOLECYSTECTOMY N/A 03/03/2020   Procedure: LAPAROSCOPIC CHOLECYSTECTOMY WITH INTRAOPERATIVE CHOLANGIOGRAM;  Surgeon: Sheliah Hatch, De Blanch, MD;  Location: [facility];  Service: General;  Laterality: N/A;   CYSTOSCOPY WITH BIOPSY N/A 04/29/2019   Procedure: CYSTOSCOPY WITH BLADDER BIOPSY/ RIGHT STENT REMOVAL;  Surgeon: Rene Paci, MD;  Location: [facility];  Service: Urology;  Laterality: N/A;   DILATION AND CURETTAGE OF UTERUS  yrs ago   ENDOSCOPIC RETROGRADE CHOLANGIOPANCREATOGRAPHY (ERCP) WITH PROPOFOL N/A 12/14/2019   Procedure: ENDOSCOPIC RETROGRADE CHOLANGIOPANCREATOGRAPHY (ERCP) WITH PROPOFOL;  Surgeon: Meridee Score Netty Starring., MD;  Location: [facility] ENDOSCOPY;  Service: Gastroenterology;  Laterality: N/A;    ESOPHAGOGASTRODUODENOSCOPY (EGD) WITH PROPOFOL N/A 12/14/2019   Procedure: ESOPHAGOGASTRODUODENOSCOPY (EGD) WITH PROPOFOL;  Surgeon: Meridee Score Netty Starring., MD;  Location: [facility] ENDOSCOPY;  Service: Gastroenterology;  Laterality: N/A;   LEFT HEART CATH N/A 02/19/2014   Procedure: LEFT HEART CATH;  Surgeon: Micheline Chapman, MD;  Location: [facility] CATH LAB;  Service: Cardiovascular;  Laterality: N/A;   REMOVAL OF STONES  12/14/2019   Procedure: REMOVAL OF STONES;  Surgeon: Meridee Score Netty Starring., MD;  Location: [facility] ENDOSCOPY;  Service: Gastroenterology;;   skin cancer removal      02/2020 - left upper arm    SPHINCTEROTOMY  12/14/2019   Procedure: SPHINCTEROTOMY;  Surgeon: Mansouraty, Netty Starring., MD;  Location: [facility] ENDOSCOPY;  Service: Gastroenterology;;   TRANSURETHRAL RESECTION OF BLADDER TUMOR N/A 03/18/2019   Procedure: TRANSURETHRAL RESECTION OF BLADDER TUMOR (TURBT) WITH CYSTOSCOPY RIGHT STENT PLACEMENT/ INTRAVESICAL INSTILLATION OF GEMCITABINE;  Surgeon: Rene Paci, MD;  Location: [facility];  Service: Urology;  Laterality: N/A;   TRANSURETHRAL RESECTION OF BLADDER TUMOR N/A 09/23/2019   Procedure: Cystoscopy with TURBT (small) and resection of right ureteral orifice, Right JJ stent placement; Right retrograde pyelogram with intraoperative interpretation of fluoroscopic imaging post operative instillation of gemcitabine;  Surgeon: Rene Paci, MD;  Location: [facility];  Service: Urology;  Laterality: N/A;   TUBAL LIGATION Bilateral yrs ago   VIDEO ASSISTED THORACOSCOPY (VATS)/EMPYEMA Left 08-30-2009   dr hendrickson  @[facility]     Family History  Problem Relation Age of Onset   Cancer Mother    Diabetes Mother    Heart disease Mother    Hyperlipidemia Mother    Hypertension Mother    Heart disease Maternal Grandmother    Heart failure Maternal Grandmother    Heart disease Maternal Grandfather    Diabetes Maternal Grandfather    Heart attack  Maternal Grandfather    Cirrhosis Paternal Grandmother    Diabetes Brother    Heart disease Brother    Hyperlipidemia Brother    Hypertension Brother    Diabetes Brother    Hyperlipidemia Brother    Hypertension Brother    Stroke Neg Hx    Colon cancer Neg Hx    Pancreatic cancer Neg Hx    Stomach cancer Neg Hx    Esophageal cancer Neg Hx    Inflammatory bowel disease Neg Hx    Liver disease Neg Hx    Rectal cancer Neg Hx     Social History   Socioeconomic History   Marital status: Single    Spouse name: Not on file   Number of children: Not on file   Years of education: Not on file   Highest education level: 12th grade  Occupational History   Not on file  Tobacco Use   Smoking status: Former    Current packs/day: 0.00    Types: Cigarettes    Start date: 08/28/1989    Quit date: 08/28/2009  Years since quitting: 13.6   Smokeless tobacco: Never  Vaping Use   Vaping status: Never Used  Substance and Sexual Activity   Alcohol use: Yes    Alcohol/week: 0.0 standard drinks of alcohol    Comment: rare   Drug use: Never   Sexual activity: Not on file  Other Topics Concern   Not on file  Social History Narrative   ** Merged History Encounter **       Social Drivers of Health   Financial Resource Strain: Low Risk  (04/22/2023)   Overall Financial Resource Strain (CARDIA)    Difficulty of Paying Living Expenses: Not very hard  Food Insecurity: No Food Insecurity (04/22/2023)   Hunger Vital Sign    Worried About Running Out of Food in the Last Year: Never true    Ran Out of Food in the Last Year: Never true  Transportation Needs: No Transportation Needs (04/22/2023)   PRAPARE - Administrator, Civil Service (Medical): No    Lack of Transportation (Non-Medical): No  Physical Activity: Unknown (04/22/2023)   Exercise Vital Sign    Days of Exercise per Week: 0 days    Minutes of Exercise per Session: Not on file  Stress: No Stress Concern Present (04/22/2023)    [employer] of Occupational Health - Occupational Stress Questionnaire    Feeling of Stress : Not at all  Social Connections: Moderately Isolated (04/22/2023)   Social Connection and Isolation Panel [NHANES]    Frequency of Communication with Friends and Family: More than three times a week    Frequency of Social Gatherings with Friends and Family: Three times a week    Attends Religious Services: 1 to 4 times per year    Active Member of Clubs or Organizations: No    Attends Banker Meetings: Not on file    Marital Status: Divorced  Intimate Partner Violence: Not At Risk (10/23/2022)   Humiliation, Afraid, Rape, and Kick questionnaire    Fear of Current or Ex-Partner: No    Emotionally Abused: No    Physically Abused: No    Sexually Abused: No    Review of Systems  All other systems reviewed and are negative.       Objective    BP 131/84 (BP Location: Right Arm, Patient Position: Sitting, Cuff Size: Large)   Pulse 89   Wt 283 lb 6.4 oz (128.5 kg)   LMP  (LMP Unknown)   SpO2 92%   BMI 48.65 kg/m   Physical Exam Vitals and nursing note reviewed.  Constitutional:      General: She is not in acute distress.    Appearance: She is obese.  Cardiovascular:     Rate and Rhythm: Normal rate and regular rhythm.  Pulmonary:     Effort: Pulmonary effort is normal.     Breath sounds: Normal breath sounds.  Abdominal:     Palpations: Abdomen is soft.     Tenderness: There is no abdominal tenderness.  Neurological:     General: No focal deficit present.     Mental Status: She is alert and oriented to person, place, and time.         Assessment & Plan:   Type 2 diabetes mellitus with other specified complication, without long-term current use of insulin (HCC) -     POCT glycosylated hemoglobin (Hb A1C)  Essential hypertension  Hyperlipidemia, unspecified hyperlipidemia type  Class 3 severe obesity due to excess calories with serious comorbidity and  body mass index (BMI) of 45.0 to 49.9 in adult [facility])     Return in about 3 months (around 07/23/2023) for follow up.   Tommie Raymond, MD

## 2023-04-24 ENCOUNTER — Encounter: Payer: Self-pay | Admitting: Family Medicine

## 2023-05-09 ENCOUNTER — Ambulatory Visit: Payer: PPO

## 2023-05-09 VITALS — BP 131/84 | Ht 64.0 in | Wt 282.0 lb

## 2023-05-09 DIAGNOSIS — Z5320 Procedure and treatment not carried out because of patient's decision for unspecified reasons: Secondary | ICD-10-CM | POA: Diagnosis not present

## 2023-05-09 DIAGNOSIS — Z0000 Encounter for general adult medical examination without abnormal findings: Secondary | ICD-10-CM | POA: Diagnosis not present

## 2023-05-09 DIAGNOSIS — Z2821 Immunization not carried out because of patient refusal: Secondary | ICD-10-CM | POA: Diagnosis not present

## 2023-05-09 NOTE — Patient Instructions (Signed)
 Hannah Beard , Thank you for taking time to come for your Medicare Wellness Visit. I appreciate your ongoing commitment to your health goals. Please review the following plan we discussed and let me know if I can assist you in the future.   Referrals/Orders/Follow-Ups/Clinician Recommendations: follow up next year for next annual wellness   This is a list of the screening recommended for you and due dates:  Health Maintenance  Topic Date Due   Yearly kidney health urinalysis for diabetes  Never done   DEXA scan (bone density measurement)  Never done   Mammogram  06/02/2022   COVID-19 Vaccine (5 - 2024-25 season) 09/16/2022   Colon Cancer Screening  12/29/2022   Yearly kidney function blood test for diabetes  04/23/2023   Flu Shot  08/16/2023   Medicare Annual Wellness Visit  05/08/2024   DTaP/Tdap/Td vaccine (2 - Td or Tdap) 11/15/2029   Hepatitis C Screening  Completed   Zoster (Shingles) Vaccine  Completed   HPV Vaccine  Aged Out   Meningitis B Vaccine  Aged Out   Pneumonia Vaccine  Discontinued    Advanced directives: (Declined) Advance directive discussed with you today. Even though you declined this today, please call our office should you change your mind, and we can give you the proper paperwork for you to fill out.  Next Medicare Annual Wellness Visit scheduled for next year: Yes

## 2023-05-09 NOTE — Progress Notes (Signed)
 Because this visit was a virtual/telehealth visit,  certain criteria was not obtained, such a blood pressure, CBG if applicable, and timed get up and go. Any medications not marked as "taking" were not mentioned during the medication reconciliation part of the visit. Any vitals not documented were not able to be obtained due to this being a telehealth visit or patient was unable to self-report a recent blood pressure reading due to a lack of equipment at home via telehealth. Vitals that have been documented are verbally provided by the patient.  Subjective:   Hannah Beard is a 68 y.o. who presents for a Medicare Wellness preventive visit.  Visit Complete: Virtual I connected with  Hannah Beard on 05/09/23 by a audio enabled telemedicine application and verified that I am speaking with the correct person using two identifiers.  Patient Location: Home  Provider Location: Home Office  I discussed the limitations of evaluation and management by telemedicine. The patient expressed understanding and agreed to proceed.  Vital Signs: Because this visit was a virtual/telehealth visit, some criteria may be missing or patient reported. Any vitals not documented were not able to be obtained and vitals that have been documented are patient reported.  VideoDeclined- This patient declined Librarian, academic. Therefore the visit was completed with audio only.  Persons Participating in Visit: Patient.  AWV Questionnaire: No: Patient Medicare AWV questionnaire was not completed prior to this visit.  Cardiac Risk Factors include: advanced age (>35men, >46 women);hypertension;obesity (BMI >30kg/m2);dyslipidemia     Objective:    Today's Vitals   05/09/23 0852 05/09/23 0853  BP: 131/84   Weight: 282 lb (127.9 kg)   Height: 5\' 4"  (1.626 m)   PainSc:  0-No pain   Body mass index is 48.41 kg/m.     05/09/2023    8:51 AM 02/23/2020   11:08 AM 12/14/2019    6:59 AM  09/23/2019   10:29 AM 08/01/2019    9:49 PM 04/29/2019    9:33 AM 03/18/2019   11:29 AM  Advanced Directives  Does Patient Have a Medical Advance Directive? No No No Yes No No No  Does patient want to make changes to medical advance directive?    No - Patient declined     Would patient like information on creating a medical advance directive? No - Patient declined  No - Patient declined   No - Patient declined No - Patient declined    Current Medications (verified) Outpatient Encounter Medications as of 05/09/2023  Medication Sig   acetaminophen  (TYLENOL ) 650 MG CR tablet Take 1,300 mg by mouth every 8 (eight) hours as needed for pain.   alclomethasone (ACLOVATE ) 0.05 % cream Apply topically 2 (two) times daily as needed (Rash).   amLODipine  (NORVASC ) 10 MG tablet TAKE 1 TABLET(10 MG) BY MOUTH EVERY EVENING   ascorbic acid (VITAMIN C) 500 MG tablet Take 500 mg by mouth 2 (two) times daily.   atorvastatin  (LIPITOR ) 80 MG tablet Take 1 tablet (80 mg total) by mouth every evening.   Cholecalciferol (VITAMIN D3) 25 MCG (1000 UT) CAPS Take 1,000 Units by mouth daily.    mupirocin  ointment (BACTROBAN ) 2 % Apply 1 application topically 2 (two) times daily.   traZODone  (DESYREL ) 50 MG tablet Take 0.5 tablets (25 mg total) by mouth at bedtime as needed for sleep.   triamcinolone  cream (KENALOG ) 0.1 % Apply 1 application topically 2 (two) times daily.   No facility-administered encounter medications on file as of 05/09/2023.  Allergies (verified) Patient has no known allergies.   History: Past Medical History:  Diagnosis Date   (HFpEF) heart failure with preserved ejection fraction (HCC) 02/2014   volume excess in setting of STEMI and volume resuscitation (EF preserved) post cardiac cath;  last echo 02-20-2014  ef 55-60%. G2DD   Arthritis    back L3-S1   Atypical mole    right lower back moderate atypia   Bilateral cataracts    MD just watching   Bladder cancer (HCC)    CAD (coronary artery  disease) previous cardiologist Dr Arlester Ladd, lov note in epic 04-28-2014  (03-13-2019  per pt has not seen any doctor in approx. 5 yrs ago, but denies any cardiac s&s)   a. 02/19/14 inf STEMI s/p DES to LCx and moderate stenosis midRCA   Eczema    History of pleural empyema    left side  08-30-2009 s/p  left VATS w/ drainage/ decortication   History of ST elevation myocardial infarction (STEMI) 02/19/2014   HLD (hyperlipidemia)    Hypertension    03-13-2019  was on medication 5 yrs ago but none since this time due to not having seen any doctor for 5 yrs due to insurance issue's   Nodular basal cell carcinoma (BCC) 02/18/2020   Left Upper Arm Posterior   Pneumonia    x  1   S/P drug eluting coronary stent placement    02-20-2004   PCI and DES x1 to LCx   Skin cancer of arm    Wears glasses    Past Surgical History:  Procedure Laterality Date   BILIARY DILATION  12/14/2019   Procedure: BILIARY DILATION;  Surgeon: Normie Becton., MD;  Location: [facility] ENDOSCOPY;  Service: Gastroenterology;;   BIOPSY  12/14/2019   Procedure: BIOPSY;  Surgeon: Normie Becton., MD;  Location: [facility] ENDOSCOPY;  Service: Gastroenterology;;   BREAST BIOPSY Right 06/01/2019   MILD FIBROCYSTIC CHANGE. FIBROADENOMA   CARDIAC CATHETERIZATION  02/19/2014   left   CHOLECYSTECTOMY N/A 03/03/2020   Procedure: LAPAROSCOPIC CHOLECYSTECTOMY WITH INTRAOPERATIVE CHOLANGIOGRAM;  Surgeon: Dorrie Gaudier Alphonso Aschoff, MD;  Location: [facility] ORS;  Service: General;  Laterality: N/A;   CYSTOSCOPY WITH BIOPSY N/A 04/29/2019   Procedure: CYSTOSCOPY WITH BLADDER BIOPSY/ RIGHT STENT REMOVAL;  Surgeon: Adelbert Homans, MD;  Location: [facility];  Service: Urology;  Laterality: N/A;   DILATION AND CURETTAGE OF UTERUS  yrs ago   ENDOSCOPIC RETROGRADE CHOLANGIOPANCREATOGRAPHY (ERCP) WITH PROPOFOL  N/A 12/14/2019   Procedure: ENDOSCOPIC RETROGRADE CHOLANGIOPANCREATOGRAPHY (ERCP) WITH PROPOFOL ;  Surgeon: Brice Campi  Albino Alu., MD;  Location: [facility] ENDOSCOPY;  Service: Gastroenterology;  Laterality: N/A;   ESOPHAGOGASTRODUODENOSCOPY (EGD) WITH PROPOFOL  N/A 12/14/2019   Procedure: ESOPHAGOGASTRODUODENOSCOPY (EGD) WITH PROPOFOL ;  Surgeon: Brice Campi Albino Alu., MD;  Location: [facility] ENDOSCOPY;  Service: Gastroenterology;  Laterality: N/A;   LEFT HEART CATH N/A 02/19/2014   Procedure: LEFT HEART CATH;  Surgeon: Arlander Bellman, MD;  Location: [facility] CATH LAB;  Service: Cardiovascular;  Laterality: N/A;   REMOVAL OF STONES  12/14/2019   Procedure: REMOVAL OF STONES;  Surgeon: Brice Campi Albino Alu., MD;  Location: [facility] ENDOSCOPY;  Service: Gastroenterology;;   skin cancer removal      02/2020 - left upper arm    SPHINCTEROTOMY  12/14/2019   Procedure: SPHINCTEROTOMY;  Surgeon: Mansouraty, Albino Alu., MD;  Location: [facility] ENDOSCOPY;  Service: Gastroenterology;;   TRANSURETHRAL RESECTION OF BLADDER TUMOR N/A 03/18/2019   Procedure: TRANSURETHRAL RESECTION OF BLADDER TUMOR (TURBT) WITH CYSTOSCOPY RIGHT STENT PLACEMENT/ INTRAVESICAL INSTILLATION  OF GEMCITABINE ;  Surgeon: Adelbert Homans, MD;  Location: [facility];  Service: Urology;  Laterality: N/A;   TRANSURETHRAL RESECTION OF BLADDER TUMOR N/A 09/23/2019   Procedure: Cystoscopy with TURBT (small) and resection of right ureteral orifice, Right JJ stent placement; Right retrograde pyelogram with intraoperative interpretation of fluoroscopic imaging post operative instillation of gemcitabine ;  Surgeon: Adelbert Homans, MD;  Location: [facility];  Service: Urology;  Laterality: N/A;   TUBAL LIGATION Bilateral yrs ago   VIDEO ASSISTED THORACOSCOPY (VATS)/EMPYEMA Left 08-30-2009   dr [email or web address]  @[facility]    Family History  Problem Relation Age of Onset   Cancer Mother    Diabetes Mother    Heart disease Mother    Hyperlipidemia Mother    Hypertension Mother    Heart disease Maternal Grandmother    Heart failure Maternal Grandmother     Heart disease Maternal Grandfather    Diabetes Maternal Grandfather    Heart attack Maternal Grandfather    Cirrhosis Paternal Grandmother    Diabetes Brother    Heart disease Brother    Hyperlipidemia Brother    Hypertension Brother    Diabetes Brother    Hyperlipidemia Brother    Hypertension Brother    Stroke Neg Hx    Colon cancer Neg Hx    Pancreatic cancer Neg Hx    Stomach cancer Neg Hx    Esophageal cancer Neg Hx    Inflammatory bowel disease Neg Hx    Liver disease Neg Hx    Rectal cancer Neg Hx    Social History   Socioeconomic History   Marital status: Single    Spouse name: Not on file   Number of children: Not on file   Years of education: Not on file   Highest education level: 12th grade  Occupational History   Not on file  Tobacco Use   Smoking status: Former    Current packs/day: 0.00    Types: Cigarettes    Start date: 08/28/1989    Quit date: 08/28/2009    Years since quitting: 13.7   Smokeless tobacco: Never  Vaping Use   Vaping status: Never Used  Substance and Sexual Activity   Alcohol use: Yes    Alcohol/week: 0.0 standard drinks of alcohol    Comment: rare   Drug use: Never   Sexual activity: Not on file  Other Topics Concern   Not on file  Social History Narrative   ** Merged History Encounter **       Social Drivers of Health   Financial Resource Strain: Low Risk  (05/09/2023)   Overall Financial Resource Strain (CARDIA)    Difficulty of Paying Living Expenses: Not very hard  Food Insecurity: No Food Insecurity (05/09/2023)   Hunger Vital Sign    Worried About Running Out of Food in the Last Year: Never true    Ran Out of Food in the Last Year: Never true  Transportation Needs: No Transportation Needs (05/09/2023)   PRAPARE - Administrator, Civil Service (Medical): No    Lack of Transportation (Non-Medical): No  Physical Activity: Insufficiently Active (05/09/2023)   Exercise Vital Sign    Days of Exercise per Week:  4 days    Minutes of Exercise per Session: 30 min  Stress: No Stress Concern Present (05/09/2023)   [employer] of Occupational Health - Occupational Stress Questionnaire    Feeling of Stress : Not at all  Social Connections: Moderately Isolated (05/09/2023)  Social Connection and Isolation Panel [NHANES]    Frequency of Communication with Friends and Family: More than three times a week    Frequency of Social Gatherings with Friends and Family: Three times a week    Attends Religious Services: 1 to 4 times per year    Active Member of Clubs or Organizations: No    Attends Engineer, structural: Not on file    Marital Status: Divorced    Tobacco Counseling Counseling given: Not Answered    Clinical Intake:  Pre-visit preparation completed: Yes  Pain : No/denies pain Pain Score: 0-No pain     BMI - recorded: 48.41 Nutritional Status: BMI > 30  Obese Nutritional Risks: None Diabetes: No  Lab Results  Component Value Date   HGBA1C 8.0 (A) 04/23/2023   HGBA1C 8.2 (H) 10/23/2022   HGBA1C 7.4 (A) 10/23/2021     How often do you need to have someone help you when you read instructions, pamphlets, or other written materials from your doctor or pharmacy?: 1 - Never What is the last grade level you completed in school?: hs graduate  Interpreter Needed?: No  Information entered by :: Juliann Ochoa   Activities of Daily Living     05/09/2023    8:57 AM  In your present state of health, do you have any difficulty performing the following activities:  Hearing? 0  Vision? 0  Difficulty concentrating or making decisions? 0  Walking or climbing stairs? 0  Dressing or bathing? 0  Doing errands, shopping? 0  Preparing Food and eating ? N  Using the Toilet? N  In the past six months, have you accidently leaked urine? N  Do you have problems with loss of bowel control? N  Managing your Medications? N  Managing your Finances? N  Housekeeping or managing  your Housekeeping? N    Patient Care Team: Abraham Abo, MD as PCP - General (Family Medicine) Patient, No Pcp Per (General Practice) Kennieth Peaches, Raynald Calkins, PA-C as Physician Assistant (Dermatology)  Indicate any recent Medical Services you may have received from other than [facility] providers in the past year (date may be approximate).     Assessment:   This is a routine wellness examination for Hannah Beard.  Hearing/Vision screen Hearing Screening - Comments:: No hearing difficulties Vision Screening - Comments:: Patient wears glasses    Goals Addressed               This Visit's Progress     Patient Stated (pt-stated)        Patient would need to loose more weight        Depression Screen     05/09/2023    8:59 AM 10/23/2022    8:26 AM 04/23/2022   10:12 AM 10/23/2021    8:08 AM 04/19/2021    8:52 AM 04/18/2020    9:30 AM 02/04/2020    9:40 AM  PHQ 2/9 Scores  PHQ - 2 Score 2 2 0 0 0 0 0  PHQ- 9 Score 2 3 0 0 1 0     Fall Risk     05/09/2023    8:56 AM 04/23/2023    8:27 AM 10/23/2022    8:27 AM 10/23/2021    9:05 AM 04/18/2020    9:29 AM  Fall Risk   Falls in the past year? 0 0 0 0 0  Number falls in past yr: 0 0 0 0 0  Injury with Fall? 0 0 0 0 0  Risk for fall due to : No Fall Risks No Fall Risks   No Fall Risks  Follow up Falls evaluation completed;Falls prevention discussed Falls evaluation completed   Falls evaluation completed    MEDICARE RISK AT HOME:  Medicare Risk at Home Any stairs in or around the home?: Yes If so, are there any without handrails?: No Home free of loose throw rugs in walkways, pet beds, electrical cords, etc?: Yes Adequate lighting in your home to reduce risk of falls?: Yes Life alert?: No Use of a cane, walker or w/c?: No Grab bars in the bathroom?: Yes Shower chair or bench in shower?: No Elevated toilet seat or a handicapped toilet?: No  TIMED UP AND GO:  Was the test performed?  No  Cognitive Function: 6CIT completed         05/09/2023    8:54 AM  6CIT Screen  What Year? 0 points  What month? 0 points  What time? 0 points  Count back from 20 0 points  Months in reverse 0 points  Repeat phrase 0 points  Total Score 0 points    Immunizations Immunization History  Administered Date(s) Administered   Influenza, High Dose Seasonal PF 10/16/2021   Influenza,inj,Quad PF,6+ Mos 11/16/2019   Influenza,inj,Quad PF,6-35 Mos 10/03/2020   Janssen (J&J) SARS-COV-2 Vaccination 05/19/2019   Moderna Covid-19 Vaccine Bivalent Booster 65yrs & up 10/03/2020, 04/19/2021, 10/16/2021   Moderna SARS-COV2 Booster Vaccination 11/14/2019   Tdap 11/16/2019   Zoster Recombinant(Shingrix) 11/16/2019, 03/10/2020    Screening Tests Health Maintenance  Topic Date Due   Diabetic kidney evaluation - Urine ACR  Never done   DEXA SCAN  Never done   MAMMOGRAM  06/02/2022   COVID-19 Vaccine (5 - 2024-25 season) 09/16/2022   Colonoscopy  12/29/2022   Diabetic kidney evaluation - eGFR measurement  04/23/2023   INFLUENZA VACCINE  08/16/2023   Medicare Annual Wellness (AWV)  05/08/2024   DTaP/Tdap/Td (2 - Td or Tdap) 11/15/2029   Hepatitis C Screening  Completed   Zoster Vaccines- Shingrix  Completed   HPV VACCINES  Aged Out   Meningococcal B Vaccine  Aged Out   Pneumonia Vaccine 60+ Years old  Discontinued    Health Maintenance  Health Maintenance Due  Topic Date Due   Diabetic kidney evaluation - Urine ACR  Never done   DEXA SCAN  Never done   MAMMOGRAM  06/02/2022   COVID-19 Vaccine (5 - 2024-25 season) 09/16/2022   Colonoscopy  12/29/2022   Diabetic kidney evaluation - eGFR measurement  04/23/2023   Health Maintenance Items Addressed:declined health maintenance  Additional Screening:  Vision Screening: Recommended annual ophthalmology exams for early detection of glaucoma and other disorders of the eye.  Dental Screening: Recommended annual dental exams for proper oral hygiene  Community Resource Referral /  Chronic Care Management: CRR required this visit?  No   CCM required this visit?  No     Plan:     I have personally reviewed and noted the following in the patient's chart:   Medical and social history Use of alcohol, tobacco or illicit drugs  Current medications and supplements including opioid prescriptions. Patient is not currently taking opioid prescriptions. Functional ability and status Nutritional status Physical activity Advanced directives List of other physicians Hospitalizations, surgeries, and ER visits in previous 12 months Vitals Screenings to include cognitive, depression, and falls Referrals and appointments  In addition, I have reviewed and discussed with patient certain preventive protocols, quality metrics, and best practice  recommendations. A written personalized care plan for preventive services as well as general preventive health recommendations were provided to patient.     Freeda Jerry, [place]   05/09/2023   After Visit Summary: (MyChart) Due to this being a telephonic visit, the after visit summary with patients personalized plan was offered to patient via MyChart   Notes: Nothing significant to report at this time.

## 2023-05-17 ENCOUNTER — Encounter: Payer: Self-pay | Admitting: Pharmacist

## 2023-05-17 ENCOUNTER — Ambulatory Visit: Payer: Self-pay | Attending: Family Medicine | Admitting: Pharmacist

## 2023-05-17 DIAGNOSIS — E1169 Type 2 diabetes mellitus with other specified complication: Secondary | ICD-10-CM | POA: Diagnosis not present

## 2023-05-17 MED ORDER — ACCU-CHEK SOFTCLIX LANCETS MISC: 6 refills | Status: DC

## 2023-05-17 MED ORDER — ACCU-CHEK GUIDE W/DEVICE KIT: PACK | 0 refills | Status: DC

## 2023-05-17 MED ORDER — ACCU-CHEK GUIDE TEST VI STRP: ORAL_STRIP | 6 refills | Status: DC

## 2023-05-17 MED ORDER — OZEMPIC (0.25 OR 0.5 MG/DOSE) 2 MG/3ML ~~LOC~~ SOPN: 0.2500 mg | PEN_INJECTOR | SUBCUTANEOUS | 1 refills | Status: DC

## 2023-05-17 NOTE — Progress Notes (Signed)
    S:     No chief complaint on file.  68 y.o. female who presents for diabetes evaluation, education, and management. Patient arrives in good spirits and presents without any assistance.   Patient was referred and last seen by Primary Care Provider, Dr. Elvan Hamel, on 04/23/2023. At that visit, A1c was 8.0.   PMH is significant for T2DM newly dx'd, CAD sp STEMI in 2016 (DES placed to dominant Lcx in 2016), HFpEF (last EF 55-60% in 2016), hx of cholecystectomy, hx of malignant neoplasm of bladder (S/p surgical resection now prone to UTI).  Family/Social History:  -Fhx: DM, heart disease, HLD, HTN -Tobacco: former smoker, quit in 2011 -Alcohol: none reported   Current diabetes medications include: none Current hypertension medications include: amlodipine  10 mg daily Current hyperlipidemia medications include: atorvastatin  80 mg daily  Patient reports adherence to taking all medications as prescribed.   Insurance coverage: [employer]   Patient denies hypoglycemic events.  Reported home fasting blood sugars: no readings with her today  Reported 2 hour post-meal/random blood sugars: no readings with her today.  Patient denies polyuria, polydipsia. Patient reports neuropathy (nerve pain). Patient denies visual changes. Patient reports self foot exams.   Patient reported dietary habits:  -Admits to some dietary indiscretion but tries to adhere to a diabetic diet.   Patient-reported exercise habits:  -Unable to exercise for prolonged periods of time given her back pain   O:   Lab Results  Component Value Date   HGBA1C 8.0 (A) 04/23/2023   There were no vitals filed for this visit.  Lipid Panel     Component Value Date/Time   CHOL CANCELED 04/23/2022 1056   TRIG CANCELED 04/23/2022 1056   HDL CANCELED 04/23/2022 1056   CHOLHDL 3.1 04/24/2021 0837   CHOLHDL 3 04/28/2014 0835   VLDL 23.6 04/28/2014 0835   LDLCALC 70 04/24/2021 0837    Clinical Atherosclerotic  Cardiovascular Disease (ASCVD): Yes  The ASCVD Risk score (Arnett DK, et al., 2019) failed to calculate for the following reasons:   Risk score cannot be calculated because patient has a medical history suggesting prior/existing ASCVD   Patient is participating in a Managed Medicaid Plan: No   A/P: Diabetes newly diagnosed currently uncontrolled given A1c. Patient is not overly symptomatic from a hyperglycemia standpoint at this time. She is not currently hypoglycemic but is able to verbalize appropriate hypoglycemia management plan. She is not currently on medication for DM. We will start Ozempic today. With her CV history, I favor agents with good ASCVD benefit. -Started Ozempic 0.25 mg weekly for 4 weeks. Then, may increase to 0.5 mg weekly thereafter if tolerable.   -Patient educated on purpose, proper use, and potential adverse effects of Ozempic.  -Extensively discussed pathophysiology of diabetes, recommended lifestyle interventions, dietary effects on blood sugar control.  -Counseled on s/sx of and management of hypoglycemia.  -Next A1c anticipated 07/2023. -Will also get labs at follow-up in 1 month.   Written patient instructions provided. Patient verbalized understanding of treatment plan.  Total time in face to face counseling 30 minutes.    Follow-up:  Pharmacist in 1 month  Marene Shape, PharmD, [place], CPP Clinical Pharmacist [facility] [phone]

## 2023-05-21 ENCOUNTER — Other Ambulatory Visit: Payer: Self-pay

## 2023-06-21 ENCOUNTER — Ambulatory Visit: Admitting: Pharmacist

## 2023-07-04 ENCOUNTER — Other Ambulatory Visit: Payer: Self-pay | Admitting: Family Medicine

## 2023-07-04 DIAGNOSIS — I10 Essential (primary) hypertension: Secondary | ICD-10-CM

## 2023-07-04 NOTE — Telephone Encounter (Unsigned)
 Copied from CRM [id number]. Topic: Clinical - Medication Refill >> Jul 04, 2023 10:18 AM Hannah Beard wrote: Medication: Amlodipine  10 MG  Has the patient contacted their pharmacy? Yes (Agent: If no, request that the patient contact the pharmacy for the refill. If patient does not wish to contact the pharmacy document the reason why and proceed with request.) (Agent: If yes, when and what did the pharmacy advise?)  This is the patient's preferred pharmacy:  [id number] - [place], [place] - [address] AT [address] & [address] [place] [address] Phone: [phone] Fax: [phone]  Is this the correct pharmacy for this prescription? Yes If no, delete pharmacy and type the correct one.   Has the prescription been filled recently? No  Is the patient out of the medication? Yes  Has the patient been seen for an appointment in the last year OR does the patient have an upcoming appointment? Yes  Can we respond through MyChart? Yes  Agent: Please be advised that Rx refills may take up to 3 business days. We ask that you follow-up with your pharmacy.

## 2023-07-05 MED ORDER — AMLODIPINE BESYLATE 10 MG PO TABS: ORAL_TABLET | ORAL | 1 refills | Status: DC

## 2023-07-05 NOTE — Telephone Encounter (Signed)
 Requested Prescriptions  Pending Prescriptions Disp Refills   amLODipine  (NORVASC ) 10 MG tablet 90 tablet 1    Sig: TAKE 1 TABLET(10 MG) BY MOUTH EVERY EVENING     Cardiovascular: Calcium  Channel Blockers 2 Passed - 07/05/2023  5:28 PM      Passed - Last BP in normal range    BP Readings from Last 1 Encounters:  05/09/23 131/84         Passed - Last Heart Rate in normal range    Pulse Readings from Last 1 Encounters:  04/23/23 89         Passed - Valid encounter within last 6 months    Recent Outpatient Visits           1 month ago Type 2 diabetes mellitus with other specified complication, without long-term current use of insulin  (HCC)   [facility] Verdel Gitelman L, RPH-CPP   2 months ago Type 2 diabetes mellitus with other specified complication, without long-term current use of insulin  [facility])   [facility], MD   8 months ago Essential hypertension   [facility], MD   1 year ago Encounter for Medicare annual wellness exam   [facility], MD   1 year ago Essential hypertension   [facility], MD

## 2023-07-18 ENCOUNTER — Encounter: Payer: Self-pay | Admitting: Pharmacist

## 2023-07-18 ENCOUNTER — Other Ambulatory Visit: Payer: Self-pay

## 2023-07-18 ENCOUNTER — Telehealth: Payer: Self-pay | Admitting: Pharmacist

## 2023-07-18 ENCOUNTER — Ambulatory Visit: Attending: Family Medicine | Admitting: Pharmacist

## 2023-07-18 DIAGNOSIS — E1169 Type 2 diabetes mellitus with other specified complication: Secondary | ICD-10-CM

## 2023-07-18 DIAGNOSIS — Z7985 Long-term (current) use of injectable non-insulin antidiabetic drugs: Secondary | ICD-10-CM | POA: Diagnosis not present

## 2023-07-18 LAB — POCT GLYCOSYLATED HEMOGLOBIN (HGB A1C): HbA1c, POC (controlled diabetic range): 7.6 % — AB (ref 0.0–7.0)

## 2023-07-18 MED ORDER — ONETOUCH ULTRA TEST VI STRP: ORAL_STRIP | 2 refills | Status: AC

## 2023-07-18 MED ORDER — SEMAGLUTIDE (1 MG/DOSE) 4 MG/3ML ~~LOC~~ SOPN: 1.0000 mg | PEN_INJECTOR | SUBCUTANEOUS | 0 refills | Status: DC

## 2023-07-18 MED ORDER — ONETOUCH ULTRA 2 W/DEVICE KIT: PACK | 0 refills | Status: AC

## 2023-07-18 MED ORDER — ONETOUCH DELICA PLUS LANCET33G MISC: 2 refills | Status: AC

## 2023-07-18 NOTE — Patient Instructions (Signed)
 Thank you for coming to see me today. Please do the following:  Increase Ozempic  to 0.5 mg. Continue taking this on Fridays. I will send the next dose prescription for your [facility] to fill 4 weeks from now.  Start checking blood sugars at home 2-3 times a week.  Home blood sugar range you want to target: 80 - 180 mg/dL.   Continue making the lifestyle changes we've discussed together during our visit. Diet and exercise play a significant role in improving your blood sugars.  Follow-up with me in 4- 6 weeks.

## 2023-07-18 NOTE — Telephone Encounter (Signed)
 I increased this patient's Ozempic . Are we able to submit a PA or is it not required?

## 2023-07-18 NOTE — Progress Notes (Signed)
 S:     No chief complaint on file.  68 y.o. female who presents for diabetes evaluation, education, and management. Patient arrives in good spirits and presents without any assistance. PMH is significant for T2DM newly dx'd, CAD sp STEMI in 2016 (DES placed to dominant Lcx in 2016), HFpEF (last EF 55-60% in 2016), hx of cholecystectomy, hx of malignant neoplasm of bladder (S/p surgical resection now prone to UTI).  Patient was referred and last seen by Primary Care Provider, Dr. Tanda, on 04/23/2023. At that visit, A1c was 8.0.   I saw her on 05/17/2023 and started the PA process for Ozempic . The PA was approved on 05/21/2023. Unfortunately, the [facility] she uses was out of stock and communicated poorly with her, resulting in a delayed pick-up. She could not pick-up the Ozempic  until 06/20/2023.   Today, she reports doing well. A1c showing improvement. Today's reading is 7.6% (down from 8.0 three months prior). Commended her for this! She has completed four injections of the 0.25 mg weekly dose of Ozempic . Denies any NV, abdominal pain.   Family/Social History:  -Fhx: DM, heart disease, HLD, HTN -Tobacco: former smoker, quit in 2011 -Alcohol: none reported   Current diabetes medications include: Ozempic  0.25 mg weekly  Current hypertension medications include: amlodipine  10 mg daily Current hyperlipidemia medications include: atorvastatin  80 mg daily  Patient reports adherence to taking all medications as prescribed.   Insurance coverage: [employer]   Patient denies hypoglycemic events.  Reported home fasting blood sugars: no readings with her today  Reported 2 hour post-meal/random blood sugars: no readings with her today.  Patient denies polyuria, polydipsia. Patient reports neuropathy (nerve pain). Patient denies visual changes. Patient reports self foot exams.   Patient reported dietary habits:  -Admits to some dietary indiscretion but tries to adhere to a diabetic  diet.   Patient-reported exercise habits:  -Unable to exercise for prolonged periods of time given her back pain   O:   Lab Results  Component Value Date   HGBA1C 8.0 (A) 04/23/2023   There were no vitals filed for this visit.  Lipid Panel     Component Value Date/Time   CHOL CANCELED 04/23/2022 1056   TRIG CANCELED 04/23/2022 1056   HDL CANCELED 04/23/2022 1056   CHOLHDL 3.1 04/24/2021 0837   CHOLHDL 3 04/28/2014 0835   VLDL 23.6 04/28/2014 0835   LDLCALC 70 04/24/2021 0837    Clinical Atherosclerotic Cardiovascular Disease (ASCVD): Yes  The ASCVD Risk score (Arnett DK, et al., 2019) failed to calculate for the following reasons:   Risk score cannot be calculated because patient has a medical history suggesting prior/existing ASCVD   Patient is participating in a Managed Medicaid Plan: No   A/P: Diabetes newly diagnosed currently above goal given today's A1c but improving. Patient is not overly symptomatic from a hyperglycemia standpoint at this time. She is not currently hypoglycemic but is able to verbalize appropriate hypoglycemia management plan. She seems to be tolerating Ozempic  well, which I'm happy to hear due to her ASCVD hx. We will continue dose titration. I have instructed her to increase to the 0.5 mg weekly dose starting tomorrow. If tolerable she can increase further to the 1mg  dose after 4 injections of the 0.5 mg dose. -Increased Ozempic  0.5 mg weekly for 4 weeks. Then, may increase to 1mg  weekly thereafter if tolerable.   -Patient educated on purpose, proper use, and potential adverse effects of Ozempic .  -Extensively discussed pathophysiology of diabetes, recommended lifestyle  interventions, dietary effects on blood sugar control.  -Counseled on s/sx of and management of hypoglycemia.  -Resent testing supplies as One Touch per insurance preference.  -Next A1c anticipated 10/2023. -CMP14+eGFR, Lipid, Urine Microalbumin:Creatinine ratio  Written patient  instructions provided. Patient verbalized understanding of treatment plan.  Total time in face to face counseling 30 minutes.    Follow-up:  Pharmacist in 4-6 weeks.  Herlene Fleeta Morris, PharmD, JAQUELINE, CPP Clinical Pharmacist [facility] [phone]

## 2023-07-21 LAB — CMP14+EGFR
ALT: 47 IU/L — ABNORMAL HIGH (ref 0–32)
AST: 40 IU/L (ref 0–40)
Albumin: 4.4 g/dL (ref 3.9–4.9)
Alkaline Phosphatase: 147 IU/L — ABNORMAL HIGH (ref 44–121)
BUN/Creatinine Ratio: 14 (ref 12–28)
BUN: 18 mg/dL (ref 8–27)
Bilirubin Total: 0.4 mg/dL (ref 0.0–1.2)
CO2: 22 mmol/L (ref 20–29)
Calcium: 9.6 mg/dL (ref 8.7–10.3)
Chloride: 104 mmol/L (ref 96–106)
Creatinine, Ser: 1.29 mg/dL — ABNORMAL HIGH (ref 0.57–1.00)
Globulin, Total: 3.2 g/dL (ref 1.5–4.5)
Glucose: 169 mg/dL — ABNORMAL HIGH (ref 70–99)
Potassium: 4.4 mmol/L (ref 3.5–5.2)
Sodium: 142 mmol/L (ref 134–144)
Total Protein: 7.6 g/dL (ref 6.0–8.5)
eGFR: 45 mL/min/1.73 — ABNORMAL LOW (ref >59)

## 2023-07-21 LAB — LIPID PANEL
Chol/HDL Ratio: 3.1 ratio (ref 0.0–4.4)
Cholesterol, Total: 133 mg/dL (ref 100–199)
HDL: 43 mg/dL (ref >39)
LDL Chol Calc (NIH): 72 mg/dL (ref 0–99)
Triglycerides: 98 mg/dL (ref 0–149)
VLDL Cholesterol Cal: 18 mg/dL (ref 5–40)

## 2023-07-21 LAB — MICROALBUMIN / CREATININE URINE RATIO
Creatinine, Urine: 404 mg/dL
Microalb/Creat Ratio: 8 mg/g{creat} (ref 0–29)
Microalbumin, Urine: 33.2 ug/mL

## 2023-07-22 ENCOUNTER — Ambulatory Visit: Payer: Self-pay | Admitting: Family Medicine

## 2023-07-23 ENCOUNTER — Encounter: Payer: Self-pay | Admitting: Family Medicine

## 2023-07-23 ENCOUNTER — Ambulatory Visit (INDEPENDENT_AMBULATORY_CARE_PROVIDER_SITE_OTHER): Admitting: Family Medicine

## 2023-07-23 VITALS — BP 134/75 | HR 85 | Temp 97.9°F | Ht 64.0 in | Wt 279.2 lb

## 2023-07-23 DIAGNOSIS — E66813 Obesity, class 3: Secondary | ICD-10-CM

## 2023-07-23 DIAGNOSIS — Z6841 Body Mass Index (BMI) 40.0 and over, adult: Secondary | ICD-10-CM

## 2023-07-23 DIAGNOSIS — I10 Essential (primary) hypertension: Secondary | ICD-10-CM

## 2023-07-23 DIAGNOSIS — E1169 Type 2 diabetes mellitus with other specified complication: Secondary | ICD-10-CM | POA: Diagnosis not present

## 2023-07-23 DIAGNOSIS — E6609 Other obesity due to excess calories: Secondary | ICD-10-CM

## 2023-07-23 DIAGNOSIS — Z7985 Long-term (current) use of injectable non-insulin antidiabetic drugs: Secondary | ICD-10-CM

## 2023-07-23 DIAGNOSIS — G8929 Other chronic pain: Secondary | ICD-10-CM | POA: Diagnosis not present

## 2023-07-23 DIAGNOSIS — E785 Hyperlipidemia, unspecified: Secondary | ICD-10-CM

## 2023-07-23 DIAGNOSIS — M545 Low back pain, unspecified: Secondary | ICD-10-CM | POA: Diagnosis not present

## 2023-07-23 MED ORDER — GABAPENTIN 300 MG PO CAPS
300.0000 mg | ORAL_CAPSULE | Freq: Two times a day (BID) | ORAL | 3 refills | Status: DC
Start: 2023-07-23 — End: 2023-11-22

## 2023-07-23 NOTE — Progress Notes (Signed)
 Established Patient Office Visit  Subjective    Patient ID: Hannah Beard, female    DOB: January 30, 1955  Age: 68 y.o. MRN: [id number]  CC:  Chief Complaint  Patient presents with   Medical Management of Chronic Issues    Discuss medication for back pain    HPI Hannah Beard presents for routine follow up of chronic med issues including diabetes and hypertension. Patient also reports persistent chronic back pain and would like to try neurontin  as her last back ablation only lasted 2 months.   Outpatient Encounter Medications as of 07/23/2023  Medication Sig   acetaminophen  (TYLENOL ) 650 MG CR tablet Take 1,300 mg by mouth every 8 (eight) hours as needed for pain.   alclomethasone (ACLOVATE ) 0.05 % cream Apply topically 2 (two) times daily as needed (Rash).   amLODipine  (NORVASC ) 10 MG tablet TAKE 1 TABLET(10 MG) BY MOUTH EVERY EVENING   ascorbic acid (VITAMIN C) 500 MG tablet Take 500 mg by mouth 2 (two) times daily.   atorvastatin  (LIPITOR ) 80 MG tablet Take 1 tablet (80 mg total) by mouth every evening.   Blood Glucose Monitoring Suppl (ONE TOUCH ULTRA 2) w/Device KIT Use to check blood sugar once daily.   Cholecalciferol (VITAMIN D3) 25 MCG (1000 UT) CAPS Take 1,000 Units by mouth daily.    gabapentin  (NEURONTIN ) 300 MG capsule Take 1 capsule (300 mg total) by mouth 2 (two) times daily.   glucose blood (ONETOUCH ULTRA TEST) test strip Use to check blood sugar once daily.   Lancets (ONETOUCH DELICA PLUS LANCET33G) MISC Use to check blood sugar once daily.   mupirocin  ointment (BACTROBAN ) 2 % Apply 1 application topically 2 (two) times daily.   Semaglutide , 1 MG/DOSE, 4 MG/3ML SOPN Inject 1 mg as directed once a week. Start once you complete 4 injections of the 0.5 mg dose.   traZODone  (DESYREL ) 50 MG tablet Take 0.5 tablets (25 mg total) by mouth at bedtime as needed for sleep.   triamcinolone  cream (KENALOG ) 0.1 % Apply 1 application topically 2 (two) times daily.    Semaglutide ,0.25 or 0.5MG /DOS, (OZEMPIC , 0.25 OR 0.5 MG/DOSE,) 2 MG/3ML SOPN Inject 0.25 mg into the skin once a week. For 4 weeks. Then, increase to 0.5 mg into the skin once a week thereafter. (Patient not taking: Reported on 07/23/2023)   No facility-administered encounter medications on file as of 07/23/2023.    Past Medical History:  Diagnosis Date   (HFpEF) heart failure with preserved ejection fraction (HCC) 02/2014   volume excess in setting of STEMI and volume resuscitation (EF preserved) post cardiac cath;  last echo 02-20-2014  ef 55-60%. G2DD   Arthritis    back L3-S1   Atypical mole    right lower back moderate atypia   Bilateral cataracts    MD just watching   Bladder cancer (HCC)    CAD (coronary artery disease) previous cardiologist Dr Wonda, lov note in epic 04-28-2014  (03-13-2019  per pt has not seen any doctor in approx. 5 yrs ago, but denies any cardiac s&s)   a. 02/19/14 inf STEMI s/p DES to LCx and moderate stenosis midRCA   Eczema    History of pleural empyema    left side  08-30-2009 s/p  left VATS w/ drainage/ decortication   History of ST elevation myocardial infarction (STEMI) 02/19/2014   HLD (hyperlipidemia)    Hypertension    03-13-2019  was on medication 5 yrs ago but none since this time due to not having seen  any doctor for 5 yrs due to insurance issue's   Nodular basal cell carcinoma (BCC) 02/18/2020   Left Upper Arm Posterior   Pneumonia    x  1   S/P drug eluting coronary stent placement    02-20-2004   PCI and DES x1 to LCx   Skin cancer of arm    Wears glasses     Past Surgical History:  Procedure Laterality Date   BILIARY DILATION  12/14/2019   Procedure: BILIARY DILATION;  Surgeon: Wilhelmenia Aloha Raddle., MD;  Location: [facility] ENDOSCOPY;  Service: Gastroenterology;;   BIOPSY  12/14/2019   Procedure: BIOPSY;  Surgeon: Wilhelmenia Aloha Raddle., MD;  Location: [facility] ENDOSCOPY;  Service: Gastroenterology;;   BREAST BIOPSY Right 06/01/2019   MILD  FIBROCYSTIC CHANGE. FIBROADENOMA   CARDIAC CATHETERIZATION  02/19/2014   left   CHOLECYSTECTOMY N/A 03/03/2020   Procedure: LAPAROSCOPIC CHOLECYSTECTOMY WITH INTRAOPERATIVE CHOLANGIOGRAM;  Surgeon: Stevie Herlene Righter, MD;  Location: [facility] ORS;  Service: General;  Laterality: N/A;   CYSTOSCOPY WITH BIOPSY N/A 04/29/2019   Procedure: CYSTOSCOPY WITH BLADDER BIOPSY/ RIGHT STENT REMOVAL;  Surgeon: Devere Lonni Righter, MD;  Location: [facility];  Service: Urology;  Laterality: N/A;   DILATION AND CURETTAGE OF UTERUS  yrs ago   ENDOSCOPIC RETROGRADE CHOLANGIOPANCREATOGRAPHY (ERCP) WITH PROPOFOL  N/A 12/14/2019   Procedure: ENDOSCOPIC RETROGRADE CHOLANGIOPANCREATOGRAPHY (ERCP) WITH PROPOFOL ;  Surgeon: Wilhelmenia Aloha Raddle., MD;  Location: [facility] ENDOSCOPY;  Service: Gastroenterology;  Laterality: N/A;   ESOPHAGOGASTRODUODENOSCOPY (EGD) WITH PROPOFOL  N/A 12/14/2019   Procedure: ESOPHAGOGASTRODUODENOSCOPY (EGD) WITH PROPOFOL ;  Surgeon: Wilhelmenia Aloha Raddle., MD;  Location: [facility] ENDOSCOPY;  Service: Gastroenterology;  Laterality: N/A;   LEFT HEART CATH N/A 02/19/2014   Procedure: LEFT HEART CATH;  Surgeon: Ozell JONETTA Fell, MD;  Location: [facility] CATH LAB;  Service: Cardiovascular;  Laterality: N/A;   REMOVAL OF STONES  12/14/2019   Procedure: REMOVAL OF STONES;  Surgeon: Wilhelmenia Aloha Raddle., MD;  Location: [facility] ENDOSCOPY;  Service: Gastroenterology;;   skin cancer removal      02/2020 - left upper arm    SPHINCTEROTOMY  12/14/2019   Procedure: SPHINCTEROTOMY;  Surgeon: Mansouraty, Aloha Raddle., MD;  Location: [facility] ENDOSCOPY;  Service: Gastroenterology;;   TRANSURETHRAL RESECTION OF BLADDER TUMOR N/A 03/18/2019   Procedure: TRANSURETHRAL RESECTION OF BLADDER TUMOR (TURBT) WITH CYSTOSCOPY RIGHT STENT PLACEMENT/ INTRAVESICAL INSTILLATION OF GEMCITABINE ;  Surgeon: Devere Lonni Righter, MD;  Location: [facility];  Service: Urology;  Laterality: N/A;   TRANSURETHRAL RESECTION OF  BLADDER TUMOR N/A 09/23/2019   Procedure: Cystoscopy with TURBT (small) and resection of right ureteral orifice, Right JJ stent placement; Right retrograde pyelogram with intraoperative interpretation of fluoroscopic imaging post operative instillation of gemcitabine ;  Surgeon: Devere Lonni Righter, MD;  Location: [facility];  Service: Urology;  Laterality: N/A;   TUBAL LIGATION Bilateral yrs ago   VIDEO ASSISTED THORACOSCOPY (VATS)/EMPYEMA Left 08-30-2009   dr [email or web address]  @[facility]     Family History  Problem Relation Age of Onset   Cancer Mother    Diabetes Mother    Heart disease Mother    Hyperlipidemia Mother    Hypertension Mother    Heart disease Maternal Grandmother    Heart failure Maternal Grandmother    Heart disease Maternal Grandfather    Diabetes Maternal Grandfather    Heart attack Maternal Grandfather    Cirrhosis Paternal Grandmother    Diabetes Brother    Heart disease Brother    Hyperlipidemia Brother    Hypertension Brother  Diabetes Brother    Hyperlipidemia Brother    Hypertension Brother    Stroke Neg Hx    Colon cancer Neg Hx    Pancreatic cancer Neg Hx    Stomach cancer Neg Hx    Esophageal cancer Neg Hx    Inflammatory bowel disease Neg Hx    Liver disease Neg Hx    Rectal cancer Neg Hx     Social History   Socioeconomic History   Marital status: Single    Spouse name: Not on file   Number of children: Not on file   Years of education: Not on file   Highest education level: 12th grade  Occupational History   Not on file  Tobacco Use   Smoking status: Former    Current packs/day: 0.00    Types: Cigarettes    Start date: 08/28/1989    Quit date: 08/28/2009    Years since quitting: 13.9   Smokeless tobacco: Never  Vaping Use   Vaping status: Never Used  Substance and Sexual Activity   Alcohol use: Yes    Alcohol/week: 0.0 standard drinks of alcohol    Comment: rare   Drug use: Never   Sexual activity: Not on file  Other  Topics Concern   Not on file  Social History Narrative   ** Merged History Encounter **       Social Drivers of Health   Financial Resource Strain: Medium Risk (07/16/2023)   Overall Financial Resource Strain (CARDIA)    Difficulty of Paying Living Expenses: Somewhat hard  Food Insecurity: No Food Insecurity (07/16/2023)   Hunger Vital Sign    Worried About Running Out of Food in the Last Year: Never true    Ran Out of Food in the Last Year: Never true  Transportation Needs: No Transportation Needs (07/16/2023)   PRAPARE - Administrator, Civil Service (Medical): No    Lack of Transportation (Non-Medical): No  Physical Activity: Inactive (07/16/2023)   Exercise Vital Sign    Days of Exercise per Week: 0 days    Minutes of Exercise per Session: Not on file  Stress: No Stress Concern Present (07/16/2023)   [employer] of Occupational Health - Occupational Stress Questionnaire    Feeling of Stress: Not at all  Social Connections: Moderately Isolated (07/16/2023)   Social Connection and Isolation Panel    Frequency of Communication with Friends and Family: More than three times a week    Frequency of Social Gatherings with Friends and Family: Twice a week    Attends Religious Services: 1 to 4 times per year    Active Member of [employer] or Organizations: No    Attends Banker Meetings: Not on file    Marital Status: Widowed  Intimate Partner Violence: Not At Risk (05/09/2023)   Humiliation, Afraid, Rape, and Kick questionnaire    Fear of Current or Ex-Partner: No    Emotionally Abused: No    Physically Abused: No    Sexually Abused: No    Review of Systems  Musculoskeletal:  Positive for back pain.  All other systems reviewed and are negative.       Objective    BP 134/75   Pulse 85   Temp 97.9 F (36.6 C)   Ht 5' 4 (1.626 m)   Wt 279 lb 3.2 oz (126.6 kg)   LMP  (LMP Unknown)   SpO2 91%   BMI 47.92 kg/m   Physical Exam Vitals and nursing note  reviewed.  Constitutional:      General: She is not in acute distress.    Appearance: She is obese.  Cardiovascular:     Rate and Rhythm: Normal rate and regular rhythm.  Pulmonary:     Effort: Pulmonary effort is normal.     Breath sounds: Normal breath sounds.  Abdominal:     Palpations: Abdomen is soft.     Tenderness: There is no abdominal tenderness.  Musculoskeletal:     Lumbar back: Tenderness present. No swelling or deformity. Decreased range of motion.  Neurological:     General: No focal deficit present.     Mental Status: She is alert and oriented to person, place, and time.         Assessment & Plan:   Type 2 diabetes mellitus with other specified complication, without long-term current use of insulin  (HCC)  Essential hypertension  Hyperlipidemia, unspecified hyperlipidemia type  Class 3 severe obesity due to excess calories with serious comorbidity and body mass index (BMI) of 45.0 to 49.9 in adult  Chronic low back pain, unspecified back pain laterality, unspecified whether sciatica present  Other orders -     Gabapentin ; Take 1 capsule (300 mg total) by mouth 2 (two) times daily.  Dispense: 60 capsule; Refill: 3     Return in about 3 months (around 10/23/2023) for follow up, chronic med issues.   Tanda Raguel SQUIBB, MD

## 2023-08-26 NOTE — Progress Notes (Deleted)
 SABRA

## 2023-08-27 ENCOUNTER — Encounter: Payer: Self-pay | Admitting: Pharmacist

## 2023-08-27 ENCOUNTER — Ambulatory Visit: Attending: Family Medicine | Admitting: Pharmacist

## 2023-08-27 DIAGNOSIS — Z7985 Long-term (current) use of injectable non-insulin antidiabetic drugs: Secondary | ICD-10-CM | POA: Diagnosis not present

## 2023-08-27 DIAGNOSIS — E1169 Type 2 diabetes mellitus with other specified complication: Secondary | ICD-10-CM

## 2023-08-27 MED ORDER — SEMAGLUTIDE (1 MG/DOSE) 4 MG/3ML ~~LOC~~ SOPN: 1.0000 mg | PEN_INJECTOR | SUBCUTANEOUS | 6 refills | Status: DC

## 2023-08-27 NOTE — Progress Notes (Signed)
 S:     No chief complaint on file.  68 y.o. female who presents for diabetes evaluation, education, and management. Patient arrives in good spirits and presents without any assistance. PMH is significant for T2DM newly dx'd, CAD sp STEMI in 2016 (DES placed to dominant Lcx in 2016), HFpEF (last EF 55-60% in 2016), hx of cholecystectomy, hx of malignant neoplasm of bladder (S/p surgical resection now prone to UTI).  Patient was referred and last seen by Primary Care Provider, Dr. Tanda, on 04/23/2023. At that visit, A1c was 8.0.   I saw her on 05/17/2023 and 07/18/2023. We started her on Ozempic  in May and her A1c was improved to 7.6 in July.   Today, she reports doing well. She endorses adherence to the 1 mg weekly dose of Ozempic . Denies any NV, abdominal pain. Does have some dyspepsia and heartburn. Has happened twice since taking 1 mg weekly. Neuropathy and back pain has improved since taking gabapentin .   Family/Social History:  -Fhx: DM, heart disease, HLD, HTN -Tobacco: former smoker, quit in 2011 -Alcohol: none reported   Current diabetes medications include: Ozempic  1 mg weekly  Current hypertension medications include: amlodipine  10 mg daily Current hyperlipidemia medications include: atorvastatin  80 mg daily  Patient reports adherence to taking all medications as prescribed.   Insurance coverage: [employer]age   Patient denies hypoglycemic events.  Reported home fasting blood sugars: 111,114, 107,102,142 Avg: 115 mg/dL   Patient denies polyuria, polydipsia. Patient reports improvement neuropathy (nerve pain). Patient denies visual changes. Patient reports self foot exams.   Patient reported dietary habits:  -Admits to some dietary indiscretion but tries to adhere to a diabetic diet.   Patient-reported exercise habits:  -Unable to exercise for prolonged periods of time given her back pain   O:   Lab Results  Component Value Date   HGBA1C 7.6 (A) 07/18/2023    There were no vitals filed for this visit.  Lipid Panel     Component Value Date/Time   CHOL 133 07/18/2023 1125   TRIG 98 07/18/2023 1125   HDL 43 07/18/2023 1125   CHOLHDL 3.1 07/18/2023 1125   CHOLHDL 3 04/28/2014 0835   VLDL 23.6 04/28/2014 0835   LDLCALC 72 07/18/2023 1125    Clinical Atherosclerotic Cardiovascular Disease (ASCVD): Yes  The ASCVD Risk score (Arnett DK, et al., 2019) failed to calculate for the following reasons:   Risk score cannot be calculated because patient has a medical history suggesting prior/existing ASCVD   Patient is participating in a Managed Medicaid Plan: No   A/P: Diabetes newly diagnosed currently above goal given July's A1c but improving. Her home sugars are at goal since last visit. Patient is not symptomatic from a hyperglycemia standpoint at this time. Her neuropathy has improved with gabapentin . She is not currently hypoglycemic but is able to verbalize appropriate hypoglycemia management plan. She seems to be tolerating Ozempic  well, which I'm happy to hear due to her ASCVD hx. We discussed this together and we will continue 1 mg weekly.   -Continue Ozempic  1mg  weekly thereafter if tolerable.   -Patient educated on purpose, proper use, and potential adverse effects of Ozempic .  -Extensively discussed pathophysiology of diabetes, recommended lifestyle interventions, dietary effects on blood sugar control.  -Counseled on s/sx of and management of hypoglycemia.  -Resent testing supplies as One Touch per insurance preference.  -Next A1c anticipated 10/2023.  Written patient instructions provided. Patient verbalized understanding of treatment plan.  Total time in face to  face counseling 30 minutes.    Follow-up:  Pharmacist in 4-6 weeks.  Herlene Fleeta Morris, PharmD, JAQUELINE, CPP Clinical Pharmacist [facility] [phone]

## 2023-09-19 DIAGNOSIS — Z8551 Personal history of malignant neoplasm of bladder: Secondary | ICD-10-CM | POA: Diagnosis not present

## 2023-09-19 DIAGNOSIS — R8289 Other abnormal findings on cytological and histological examination of urine: Secondary | ICD-10-CM | POA: Diagnosis not present

## 2023-10-07 ENCOUNTER — Telehealth: Payer: Self-pay | Admitting: Family Medicine

## 2023-10-07 NOTE — Telephone Encounter (Signed)
 Confirmed appt for 9/23

## 2023-10-08 ENCOUNTER — Encounter: Payer: Self-pay | Admitting: Pharmacist

## 2023-10-08 ENCOUNTER — Ambulatory Visit: Attending: Family Medicine | Admitting: Pharmacist

## 2023-10-08 DIAGNOSIS — E785 Hyperlipidemia, unspecified: Secondary | ICD-10-CM

## 2023-10-08 DIAGNOSIS — E1169 Type 2 diabetes mellitus with other specified complication: Secondary | ICD-10-CM | POA: Diagnosis not present

## 2023-10-08 DIAGNOSIS — Z7985 Long-term (current) use of injectable non-insulin antidiabetic drugs: Secondary | ICD-10-CM

## 2023-10-08 DIAGNOSIS — Z7984 Long term (current) use of oral hypoglycemic drugs: Secondary | ICD-10-CM

## 2023-10-08 MED ORDER — ATORVASTATIN CALCIUM 80 MG PO TABS: 80.0000 mg | ORAL_TABLET | Freq: Every evening | ORAL | 1 refills | Status: DC

## 2023-10-08 NOTE — Progress Notes (Signed)
    S:     No chief complaint on file.  68 y.o. female who presents for diabetes evaluation, education, and management. Patient arrives in good spirits and presents without any assistance. PMH is significant for T2DM newly dx'd, CAD sp STEMI in 2016 (DES placed to dominant Lcx in 2016), HFpEF (last EF 55-60% in 2016), hx of cholecystectomy, hx of malignant neoplasm of bladder (S/p surgical resection now prone to UTI).  Patient was referred and last seen by Primary Care Provider, Dr. Tanda, on 07/23/2023. Pharmacy last saw her on 08/27/23.  Today, she reports doing well. She endorses adherence to the 1 mg weekly dose of Ozempic . Denies any NV, abdominal pain. Dyspepsia symptoms reported at previous appt with me have improved.  Family/Social History:  -Fhx: DM, heart disease, HLD, HTN -Tobacco: former smoker, quit in 2011 -Alcohol: none reported   Current diabetes medications include: Ozempic  1 mg weekly  Current hypertension medications include: amlodipine  10 mg daily Current hyperlipidemia medications include: atorvastatin  80 mg daily  Patient reports adherence to taking all medications as prescribed.   Insurance coverage: [employer] Advantage   Patient denies hypoglycemic events.  Reported home fasting blood sugars: 121, 117, 108, 115, 129, 113   Patient denies polyuria, polydipsia. Patient reports improvement neuropathy (nerve pain). Patient denies visual changes. Patient reports self foot exams.   Patient reported dietary habits:  -Admits to some dietary indiscretion but tries to adhere to a diabetic diet.   Patient-reported exercise habits:  -Unable to exercise for prolonged periods of time given her back pain   O:   Lab Results  Component Value Date   HGBA1C 7.6 (A) 07/18/2023   There were no vitals filed for this visit.  Lipid Panel     Component Value Date/Time   CHOL 133 07/18/2023 1125   TRIG 98 07/18/2023 1125   HDL 43 07/18/2023 1125   CHOLHDL 3.1  07/18/2023 1125   CHOLHDL 3 04/28/2014 0835   VLDL 23.6 04/28/2014 0835   LDLCALC 72 07/18/2023 1125    Clinical Atherosclerotic Cardiovascular Disease (ASCVD): Yes  The ASCVD Risk score (Arnett DK, et al., 2019) failed to calculate for the following reasons:   Risk score cannot be calculated because patient has a medical history suggesting prior/existing ASCVD   Patient is participating in a Managed Medicaid Plan: No   A/P: Diabetes newly diagnosed currently above goal given July's A1c but improving. Her home sugars are at goal since last visit. She is tolerating Ozempic  well. Patient is not symptomatic from a hyperglycemia standpoint at this time. She is not currently hypoglycemic but is able to verbalize appropriate hypoglycemia management plan. She seems to be tolerating Ozempic  well, which I'm happy to hear due to her ASCVD hx. We discussed this together and we will continue 1 mg weekly.   -Continue Ozempic  1mg  weekly if tolerable. Will consider increasing to 2 mg weekly at follow-up.  -Patient educated on purpose, proper use, and potential adverse effects of Ozempic .  -Extensively discussed pathophysiology of diabetes, recommended lifestyle interventions, dietary effects on blood sugar control.  -Counseled on s/sx of and management of hypoglycemia.  -Resent testing supplies as One Touch per insurance preference.  -Next A1c anticipated 10/2023.  Written patient instructions provided. Patient verbalized understanding of treatment plan.  Total time in face to face counseling 30 minutes.    Follow-up:  Pharmacist in 4-6 weeks.  Herlene Fleeta Morris, PharmD, JAQUELINE, CPP Clinical Pharmacist [facility] [phone]

## 2023-10-23 ENCOUNTER — Ambulatory Visit: Admitting: Family Medicine

## 2023-10-23 ENCOUNTER — Encounter: Payer: Self-pay | Admitting: Family Medicine

## 2023-10-23 ENCOUNTER — Ambulatory Visit: Payer: Self-pay | Admitting: Family Medicine

## 2023-10-23 VITALS — BP 130/81 | HR 92 | Ht 64.0 in | Wt 272.8 lb

## 2023-10-23 DIAGNOSIS — Z794 Long term (current) use of insulin: Secondary | ICD-10-CM | POA: Diagnosis not present

## 2023-10-23 DIAGNOSIS — E1169 Type 2 diabetes mellitus with other specified complication: Secondary | ICD-10-CM | POA: Diagnosis not present

## 2023-10-23 DIAGNOSIS — I10 Essential (primary) hypertension: Secondary | ICD-10-CM

## 2023-10-23 DIAGNOSIS — E66813 Obesity, class 3: Secondary | ICD-10-CM | POA: Diagnosis not present

## 2023-10-23 DIAGNOSIS — E785 Hyperlipidemia, unspecified: Secondary | ICD-10-CM | POA: Diagnosis not present

## 2023-10-23 DIAGNOSIS — Z6841 Body Mass Index (BMI) 40.0 and over, adult: Secondary | ICD-10-CM

## 2023-10-23 LAB — POCT GLYCOSYLATED HEMOGLOBIN (HGB A1C): HbA1c, POC (controlled diabetic range): 6.3 % (ref 0.0–7.0)

## 2023-10-23 NOTE — Progress Notes (Signed)
 Established Patient Office Visit  Subjective    Patient ID: Hannah Beard, female    DOB: May 20, 1955  Age: 68 y.o. MRN: [id number]  CC:  Chief Complaint  Patient presents with   Medical Management of Chronic Issues    Needs A1c check     HPI Hannah Beard presents for routine follow up of chronic med issues including diabetes and hypertension. Patient reports med compliance and denies acute complaints.   Outpatient Encounter Medications as of 10/23/2023  Medication Sig   acetaminophen  (TYLENOL ) 650 MG CR tablet Take 1,300 mg by mouth every 8 (eight) hours as needed for pain.   alclomethasone (ACLOVATE ) 0.05 % cream Apply topically 2 (two) times daily as needed (Rash).   amLODipine  (NORVASC ) 10 MG tablet TAKE 1 TABLET(10 MG) BY MOUTH EVERY EVENING   atorvastatin  (LIPITOR ) 80 MG tablet Take 1 tablet (80 mg total) by mouth every evening.   Blood Glucose Monitoring Suppl (ONE TOUCH ULTRA 2) w/Device KIT Use to check blood sugar once daily.   Cholecalciferol (VITAMIN D3) 25 MCG (1000 UT) CAPS Take 1,000 Units by mouth daily.    gabapentin  (NEURONTIN ) 300 MG capsule Take 1 capsule (300 mg total) by mouth 2 (two) times daily.   glucose blood (ONETOUCH ULTRA TEST) test strip Use to check blood sugar once daily.   Lancets (ONETOUCH DELICA PLUS LANCET33G) MISC Use to check blood sugar once daily.   mupirocin  ointment (BACTROBAN ) 2 % Apply 1 application topically 2 (two) times daily.   Semaglutide , 1 MG/DOSE, 4 MG/3ML SOPN Inject 1 mg as directed once a week.   triamcinolone  cream (KENALOG ) 0.1 % Apply 1 application topically 2 (two) times daily.   ascorbic acid (VITAMIN C) 500 MG tablet Take 500 mg by mouth 2 (two) times daily. (Patient not taking: Reported on 10/23/2023)   traZODone  (DESYREL ) 50 MG tablet Take 0.5 tablets (25 mg total) by mouth at bedtime as needed for sleep. (Patient not taking: Reported on 10/23/2023)   No facility-administered encounter medications on file as of  10/23/2023.    Past Medical History:  Diagnosis Date   (HFpEF) heart failure with preserved ejection fraction (HCC) 02/2014   volume excess in setting of STEMI and volume resuscitation (EF preserved) post cardiac cath;  last echo 02-20-2014  ef 55-60%. G2DD   Arthritis    back L3-S1   Atypical mole    right lower back moderate atypia   Bilateral cataracts    MD just watching   Bladder cancer (HCC)    CAD (coronary artery disease) previous cardiologist Dr Wonda, lov note in epic 04-28-2014  (03-13-2019  per pt has not seen any doctor in approx. 5 yrs ago, but denies any cardiac s&s)   a. 02/19/14 inf STEMI s/p DES to LCx and moderate stenosis midRCA   Eczema    History of pleural empyema    left side  08-30-2009 s/p  left VATS w/ drainage/ decortication   History of ST elevation myocardial infarction (STEMI) 02/19/2014   HLD (hyperlipidemia)    Hypertension    03-13-2019  was on medication 5 yrs ago but none since this time due to not having seen any doctor for 5 yrs due to insurance issue's   Nodular basal cell carcinoma (BCC) 02/18/2020   Left Upper Arm Posterior   Pneumonia    x  1   S/P drug eluting coronary stent placement    02-20-2004   PCI and DES x1 to LCx   Skin cancer of  arm    Wears glasses     Past Surgical History:  Procedure Laterality Date   BILIARY DILATION  12/14/2019   Procedure: BILIARY DILATION;  Surgeon: Wilhelmenia Aloha Raddle., MD;  Location: [facility] ENDOSCOPY;  Service: Gastroenterology;;   BIOPSY  12/14/2019   Procedure: BIOPSY;  Surgeon: Wilhelmenia Aloha Raddle., MD;  Location: [facility] ENDOSCOPY;  Service: Gastroenterology;;   BREAST BIOPSY Right 06/01/2019   MILD FIBROCYSTIC CHANGE. FIBROADENOMA   CARDIAC CATHETERIZATION  02/19/2014   left   CHOLECYSTECTOMY N/A 03/03/2020   Procedure: LAPAROSCOPIC CHOLECYSTECTOMY WITH INTRAOPERATIVE CHOLANGIOGRAM;  Surgeon: Stevie Herlene Righter, MD;  Location: [facility] ORS;  Service: General;  Laterality: N/A;   CYSTOSCOPY WITH  BIOPSY N/A 04/29/2019   Procedure: CYSTOSCOPY WITH BLADDER BIOPSY/ RIGHT STENT REMOVAL;  Surgeon: Devere Lonni Righter, MD;  Location: [facility];  Service: Urology;  Laterality: N/A;   DILATION AND CURETTAGE OF UTERUS  yrs ago   ENDOSCOPIC RETROGRADE CHOLANGIOPANCREATOGRAPHY (ERCP) WITH PROPOFOL  N/A 12/14/2019   Procedure: ENDOSCOPIC RETROGRADE CHOLANGIOPANCREATOGRAPHY (ERCP) WITH PROPOFOL ;  Surgeon: Wilhelmenia Aloha Raddle., MD;  Location: [facility] ENDOSCOPY;  Service: Gastroenterology;  Laterality: N/A;   ESOPHAGOGASTRODUODENOSCOPY (EGD) WITH PROPOFOL  N/A 12/14/2019   Procedure: ESOPHAGOGASTRODUODENOSCOPY (EGD) WITH PROPOFOL ;  Surgeon: Wilhelmenia Aloha Raddle., MD;  Location: [facility] ENDOSCOPY;  Service: Gastroenterology;  Laterality: N/A;   LEFT HEART CATH N/A 02/19/2014   Procedure: LEFT HEART CATH;  Surgeon: Ozell JONETTA Fell, MD;  Location: [facility] CATH LAB;  Service: Cardiovascular;  Laterality: N/A;   REMOVAL OF STONES  12/14/2019   Procedure: REMOVAL OF STONES;  Surgeon: Wilhelmenia Aloha Raddle., MD;  Location: [facility] ENDOSCOPY;  Service: Gastroenterology;;   skin cancer removal      02/2020 - left upper arm    SPHINCTEROTOMY  12/14/2019   Procedure: SPHINCTEROTOMY;  Surgeon: Mansouraty, Aloha Raddle., MD;  Location: [facility] ENDOSCOPY;  Service: Gastroenterology;;   TRANSURETHRAL RESECTION OF BLADDER TUMOR N/A 03/18/2019   Procedure: TRANSURETHRAL RESECTION OF BLADDER TUMOR (TURBT) WITH CYSTOSCOPY RIGHT STENT PLACEMENT/ INTRAVESICAL INSTILLATION OF GEMCITABINE ;  Surgeon: Devere Lonni Righter, MD;  Location: [facility];  Service: Urology;  Laterality: N/A;   TRANSURETHRAL RESECTION OF BLADDER TUMOR N/A 09/23/2019   Procedure: Cystoscopy with TURBT (small) and resection of right ureteral orifice, Right JJ stent placement; Right retrograde pyelogram with intraoperative interpretation of fluoroscopic imaging post operative instillation of gemcitabine ;  Surgeon: Devere Lonni Righter,  MD;  Location: [facility];  Service: Urology;  Laterality: N/A;   TUBAL LIGATION Bilateral yrs ago   VIDEO ASSISTED THORACOSCOPY (VATS)/EMPYEMA Left 08-30-2009   dr [email or web address]  @[facility]     Family History  Problem Relation Age of Onset   Cancer Mother    Diabetes Mother    Heart disease Mother    Hyperlipidemia Mother    Hypertension Mother    Heart disease Maternal Grandmother    Heart failure Maternal Grandmother    Heart disease Maternal Grandfather    Diabetes Maternal Grandfather    Heart attack Maternal Grandfather    Cirrhosis Paternal Grandmother    Diabetes Brother    Heart disease Brother    Hyperlipidemia Brother    Hypertension Brother    Diabetes Brother    Hyperlipidemia Brother    Hypertension Brother    Stroke Neg Hx    Colon cancer Neg Hx    Pancreatic cancer Neg Hx    Stomach cancer Neg Hx    Esophageal cancer Neg Hx    Inflammatory bowel disease Neg Hx  Liver disease Neg Hx    Rectal cancer Neg Hx     Social History   Socioeconomic History   Marital status: Single    Spouse name: Not on file   Number of children: Not on file   Years of education: Not on file   Highest education level: 12th grade  Occupational History   Not on file  Tobacco Use   Smoking status: Former    Current packs/day: 0.00    Types: Cigarettes    Start date: 08/28/1989    Quit date: 08/28/2009    Years since quitting: 14.1   Smokeless tobacco: Never  Vaping Use   Vaping status: Never Used  Substance and Sexual Activity   Alcohol use: Yes    Alcohol/week: 0.0 standard drinks of alcohol    Comment: rare   Drug use: Never   Sexual activity: Not on file  Other Topics Concern   Not on file  Social History Narrative   ** Merged History Encounter **       Social Drivers of Health   Financial Resource Strain: Medium Risk (07/16/2023)   Overall Financial Resource Strain (CARDIA)    Difficulty of Paying Living Expenses: Somewhat hard  Food Insecurity: No  Food Insecurity (07/16/2023)   Hunger Vital Sign    Worried About Running Out of Food in the Last Year: Never true    Ran Out of Food in the Last Year: Never true  Transportation Needs: No Transportation Needs (07/16/2023)   PRAPARE - Administrator, Civil Service (Medical): No    Lack of Transportation (Non-Medical): No  Physical Activity: Inactive (07/16/2023)   Exercise Vital Sign    Days of Exercise per Week: 0 days    Minutes of Exercise per Session: Not on file  Stress: No Stress Concern Present (07/16/2023)   [employer] of Occupational Health - Occupational Stress Questionnaire    Feeling of Stress: Not at all  Social Connections: Moderately Isolated (07/16/2023)   Social Connection and Isolation Panel    Frequency of Communication with Friends and Family: More than three times a week    Frequency of Social Gatherings with Friends and Family: Twice a week    Attends Religious Services: 1 to 4 times per year    Active Member of [employer] or Organizations: No    Attends Banker Meetings: Not on file    Marital Status: Widowed  Intimate Partner Violence: Not At Risk (05/09/2023)   Humiliation, Afraid, Rape, and Kick questionnaire    Fear of Current or Ex-Partner: No    Emotionally Abused: No    Physically Abused: No    Sexually Abused: No    Review of Systems  All other systems reviewed and are negative.       Objective    BP 130/81   Pulse 92   Ht 5' 4 (1.626 m)   Wt 272 lb 12.8 oz (123.7 kg)   LMP  (LMP Unknown)   SpO2 92%   BMI 46.83 kg/m   Physical Exam Vitals and nursing note reviewed.  Constitutional:      General: She is not in acute distress.    Appearance: She is obese.  Cardiovascular:     Rate and Rhythm: Normal rate and regular rhythm.  Pulmonary:     Effort: Pulmonary effort is normal.     Breath sounds: Normal breath sounds.  Abdominal:     Palpations: Abdomen is soft.     Tenderness:  There is no abdominal tenderness.   Neurological:     General: No focal deficit present.     Mental Status: She is alert and oriented to person, place, and time.         Assessment & Plan:   1. Type 2 diabetes mellitus with other specified complication, without long-term current use of insulin  (HCC) (Primary) A1c is improved and now at goal. Continue  - POCT glycosylated hemoglobin (Hb A1C)  2. Essential hypertension Appears stable. Continue   3. Hyperlipidemia, unspecified hyperlipidemia type Continue   4. Class 3 severe obesity due to excess calories with serious comorbidity and body mass index (BMI) of 45.0 to 49.9 in adult [facility])     Return in about 4 months (around 02/23/2024) for follow up.   Tanda Raguel SQUIBB, MD

## 2023-11-15 ENCOUNTER — Ambulatory Visit: Attending: Family Medicine | Admitting: Pharmacist

## 2023-11-15 ENCOUNTER — Encounter: Payer: Self-pay | Admitting: Pharmacist

## 2023-11-15 DIAGNOSIS — E1169 Type 2 diabetes mellitus with other specified complication: Secondary | ICD-10-CM

## 2023-11-15 DIAGNOSIS — Z7985 Long-term (current) use of injectable non-insulin antidiabetic drugs: Secondary | ICD-10-CM | POA: Diagnosis not present

## 2023-11-15 NOTE — Progress Notes (Signed)
 S:     No chief complaint on file.  68 y.o. female who presents for diabetes evaluation, education, and management. Patient arrives in good spirits and presents without any assistance. PMH is significant for T2DM, CAD sp STEMI in 2016 (DES placed to dominant Lcx in 2016), HFpEF (last EF 55-60% in 2016), hx of cholecystectomy, hx of malignant neoplasm of bladder (S/p surgical resection now prone to UTI).  Patient was referred and last seen by Primary Care Provider, Dr. Tanda, on 10/23/2023. A1c at that visit was 6.3% (down from 7.6% in July). Pharmacy last saw her on 10/08/2023. I have been seeing her frequently since May of this year and have started and titrated Ozempic .  Today, she reports doing well. She endorses adherence to the 1 mg weekly dose of Ozempic . Denies any NV, abdominal pain. She even endorses some weight loss (123.7 kg on 10/23/23, down from 128.5 kg in April of this year prior to Ozempic ).  Family/Social History:  -Fhx: DM, heart disease, HLD, HTN -Tobacco: former smoker, quit in 2011 -Alcohol: none reported   Current diabetes medications include: Ozempic  1 mg weekly  Current hypertension medications include: amlodipine  10 mg daily Current hyperlipidemia medications include: atorvastatin  80 mg daily  Patient reports adherence to taking all medications as prescribed.   Insurance coverage: [employer]   Patient denies hypoglycemic events.  Reported home fasting blood sugars: 109, 109, 98, 89, 110, 116, 102 mg/dL  Patient denies polyuria, polydipsia. Patient reports improvement neuropathy (nerve pain). Patient denies visual changes. Patient reports self foot exams.   Patient reported dietary habits:  -Admits to some dietary indiscretion but tries to adhere to a diabetic diet.   Patient-reported exercise habits:  -Unable to exercise for prolonged periods of time given her back pain   O:   Lab Results  Component Value Date   HGBA1C 6.3 10/23/2023    There were no vitals filed for this visit.  Lipid Panel     Component Value Date/Time   CHOL 133 07/18/2023 1125   TRIG 98 07/18/2023 1125   HDL 43 07/18/2023 1125   CHOLHDL 3.1 07/18/2023 1125   CHOLHDL 3 04/28/2014 0835   VLDL 23.6 04/28/2014 0835   LDLCALC 72 07/18/2023 1125    Clinical Atherosclerotic Cardiovascular Disease (ASCVD): Yes  The ASCVD Risk score (Arnett DK, et al., 2019) failed to calculate for the following reasons:   Risk score cannot be calculated because patient has a medical history suggesting prior/existing ASCVD   Patient is participating in a Managed Medicaid Plan: No   A/P: Diabetes currently well-controlled given A1c of 6.3% earlier this month. Her home sugars are at goal since last visit. She is tolerating Ozempic  well. Patient is not symptomatic from a hyperglycemia standpoint at this time. She is not currently hypoglycemic but is able to verbalize appropriate hypoglycemia management plan. She seems to be tolerating Ozempic  well, which I'm happy to hear due to her ASCVD hx. We discussed this together and we will continue 1 mg weekly.   -Continue Ozempic  1mg  weekly if tolerable. Will consider increasing to 2 mg weekly in the future should she desire more weight loss or require better glycemic control.  -Patient educated on purpose, proper use, and potential adverse effects of Ozempic .  -Extensively discussed pathophysiology of diabetes, recommended lifestyle interventions, dietary effects on blood sugar control.  -Counseled on s/sx of and management of hypoglycemia.  -Next A1c anticipated 01/2024.  Written patient instructions provided. Patient verbalized understanding of treatment plan.  Total time in face to face counseling 30 minutes.    Follow-up:  Pharmacist in Jan, 2026.  Hannah Beard, PharmD, Hannah Beard, Hannah Beard Clinical Pharmacist [facility] & [facility] [phone]

## 2023-11-22 ENCOUNTER — Other Ambulatory Visit: Payer: Self-pay | Admitting: Family Medicine

## 2023-12-24 ENCOUNTER — Other Ambulatory Visit: Payer: Self-pay | Admitting: Pharmacist

## 2023-12-24 NOTE — Progress Notes (Signed)
 Pharmacy Quality Measure Review  This patient is appearing on a report for the adherence measure for diabetes medications this calendar year.   Medication: Ozempic  Last fill date: 12/16/23 for 28 day supply  Insurance report was not up to date. No action needed at this time.   Hannah Beard, PharmD, JAQUELINE, CPP Clinical Pharmacist [facility] [place] [phone]

## 2024-01-15 ENCOUNTER — Other Ambulatory Visit: Payer: Self-pay | Admitting: Family Medicine

## 2024-01-15 DIAGNOSIS — E785 Hyperlipidemia, unspecified: Secondary | ICD-10-CM

## 2024-01-24 ENCOUNTER — Ambulatory Visit: Admitting: Pharmacist

## 2024-01-29 NOTE — Progress Notes (Signed)
 "   S:    Chief Complaint  Patient presents with   Diabetes   Hypertension   69 year old female who presents for diabetes evaluation, education, and management. The patient arrives in good spirits and presents without any assistance. PMH is significant for T2DM, CAD, STEMI in 2016 (DES placed in dominant LCx in 2016), HFpEF (last EF 55-60% in 2016), hx of cholecystectomy, and hx of malignant neoplasm of bladder (S/p surgical resection, now prone to UTI).  The patient was referred and last seen by Primary Care Provider, Dr. Tanda, on 10/23/23. A1c at that visit was 6.3% (down from 7.6% in July). Pharmacy last saw her on 11/15/23. I have been seeing her frequently since May of this year and have started and titrated Ozempic .  At today's visit, she is in good spirits. She is doing well on Ozempic  1 mg daily with good glucose control (96-120s). She denies any readings <70 mg/dL or symptoms of hypoglycemia (e.g., shakiness or lightheadedness). She is interested in increasing her Ozempic  to 2 mg weekly for weight loss purposes. In regard to hypertension, she states that she attempted to refill her amlodipine  but has yet to hear back from the pharmacy. Will refill today. A1c is due today.   Family/Social History:  Fhx: DM, heart disease, HLD, HTN Tobacco: former smoker, quit in 2011 Alcohol: none reported   Current diabetes medications include: Ozempic  1 mg weekly  Current hypertension medications include: Amlodipine  10 mg daily Current hyperlipidemia medications include: Atorvastatin  80 mg daily  Patient reports adherence to taking all medications as prescribed.   Insurance coverage: [employer]   Patient denies hypoglycemic events.  Reported home fasting blood sugars: 90s-120s mg/dL  Patient denies polyuria, polydipsia. Patient reports improvement neuropathy (nerve pain). Patient denies visual changes. Patient reports self foot exams.   Patient reported dietary habits: some  dietary indiscretion during holidays, but has resumed normal intake   Patient-reported exercise habits:  Unable to exercise for prolonged periods of time given her back pain   O:  Lab Results  Component Value Date   HGBA1C 6.5 02/03/2024   Vitals:   02/03/24 1404 02/03/24 1405  BP: (!) 141/78 137/83  Pulse:  83   Lipid Panel     Component Value Date/Time   CHOL 133 07/18/2023 1125   TRIG 98 07/18/2023 1125   HDL 43 07/18/2023 1125   CHOLHDL 3.1 07/18/2023 1125   CHOLHDL 3 04/28/2014 0835   VLDL 23.6 04/28/2014 0835   LDLCALC 72 07/18/2023 1125   Clinical Atherosclerotic Cardiovascular Disease (ASCVD): Yes : CAD s/p STEMI  Patient is participating in a Managed Medicaid Plan: No   A/P: Diabetes is currently well-controlled, given an A1c of 6.5%, slightly increased from 6.3% in 10/2023. She is interested in increasing her Ozempic  to 2 mg weekly to help with weight loss. Denies symptoms of hypoglycemia but is able to verbalize an appropriate hypoglycemia management plan.  Increase Ozempic  to 2 mg weekly Refilled amlodipine  10 mg daily Patient educated on purpose, proper use, and potential adverse effects of Ozempic .  Extensively discussed pathophysiology of diabetes, recommended lifestyle interventions, dietary effects on blood sugar control.  Counseled on s/sx of and management of hypoglycemia.  Next A1c anticipated 04/2024.  Written patient instructions provided. Patient verbalized understanding of treatment plan.   Total time in face to face counseling 30 minutes.    Follow-up:  PharmD: 3 months PCP: 02/24/2024  Woodie Jock, PharmD PGY1 Pharmacy Resident  02/03/2024   Herlene Fleeta Morris, PharmD,  BCACP, CPP Clinical Pharmacist [facility] & [facility] [phone]   "

## 2024-02-03 ENCOUNTER — Encounter: Payer: Self-pay | Admitting: Pharmacist

## 2024-02-03 ENCOUNTER — Ambulatory Visit: Attending: Family Medicine | Admitting: Pharmacist

## 2024-02-03 VITALS — BP 137/83 | HR 83

## 2024-02-03 DIAGNOSIS — I10 Essential (primary) hypertension: Secondary | ICD-10-CM

## 2024-02-03 DIAGNOSIS — E1169 Type 2 diabetes mellitus with other specified complication: Secondary | ICD-10-CM

## 2024-02-03 DIAGNOSIS — Z7985 Long-term (current) use of injectable non-insulin antidiabetic drugs: Secondary | ICD-10-CM

## 2024-02-03 LAB — POCT GLYCOSYLATED HEMOGLOBIN (HGB A1C): HbA1c, POC (controlled diabetic range): 6.5 % (ref 0.0–7.0)

## 2024-02-03 MED ORDER — SEMAGLUTIDE (2 MG/DOSE) 8 MG/3ML ~~LOC~~ SOPN: 2.0000 mg | PEN_INJECTOR | SUBCUTANEOUS | 3 refills | Status: AC

## 2024-02-03 MED ORDER — AMLODIPINE BESYLATE 10 MG PO TABS: ORAL_TABLET | ORAL | 1 refills | Status: AC

## 2024-02-24 ENCOUNTER — Ambulatory Visit: Admitting: Family Medicine

## 2024-05-04 ENCOUNTER — Ambulatory Visit: Payer: Self-pay | Admitting: Pharmacist

## 2024-05-21 ENCOUNTER — Ambulatory Visit
